# Patient Record
Sex: Female | Born: 1955
Health system: Southern US, Community
[De-identification: ages and names within clinical notes are randomized; demographics above are authoritative.]

## PROBLEM LIST (undated history)

## (undated) DIAGNOSIS — Z9989 Dependence on other enabling machines and devices: Secondary | ICD-10-CM

## (undated) DIAGNOSIS — G4733 Obstructive sleep apnea (adult) (pediatric): Secondary | ICD-10-CM

## (undated) DIAGNOSIS — I4892 Unspecified atrial flutter: Secondary | ICD-10-CM

## (undated) DIAGNOSIS — Z79899 Other long term (current) drug therapy: Secondary | ICD-10-CM

## (undated) DIAGNOSIS — I10 Essential (primary) hypertension: Secondary | ICD-10-CM

## (undated) DIAGNOSIS — M1990 Unspecified osteoarthritis, unspecified site: Secondary | ICD-10-CM

## (undated) DIAGNOSIS — K5190 Ulcerative colitis, unspecified, without complications: Secondary | ICD-10-CM

## (undated) DIAGNOSIS — I480 Paroxysmal atrial fibrillation: Secondary | ICD-10-CM

## (undated) DIAGNOSIS — E663 Overweight: Secondary | ICD-10-CM

## (undated) HISTORY — DX: Overweight: E66.3

## (undated) HISTORY — DX: Paroxysmal atrial fibrillation: I48.0

## (undated) HISTORY — DX: Dependence on other enabling machines and devices: Z99.89

## (undated) HISTORY — DX: Obstructive sleep apnea (adult) (pediatric): G47.33

## (undated) HISTORY — DX: Ulcerative colitis, unspecified, without complications: K51.90

## (undated) HISTORY — DX: Unspecified atrial flutter: I48.92

## (undated) HISTORY — DX: Other long term (current) drug therapy: Z79.899

---

## 1998-04-25 ENCOUNTER — Encounter: Payer: Self-pay | Admitting: Internal Medicine

## 1998-04-25 ENCOUNTER — Ambulatory Visit (HOSPITAL_COMMUNITY): Admission: RE | Admit: 1998-04-25 | Discharge: 1998-04-25 | Payer: Self-pay

## 1999-05-12 ENCOUNTER — Ambulatory Visit (HOSPITAL_COMMUNITY): Admission: RE | Admit: 1999-05-12 | Discharge: 1999-05-12 | Payer: Self-pay | Admitting: Surgery

## 1999-05-12 ENCOUNTER — Encounter: Payer: Self-pay | Admitting: Internal Medicine

## 2000-07-27 ENCOUNTER — Encounter: Payer: Self-pay | Admitting: Emergency Medicine

## 2000-07-27 ENCOUNTER — Emergency Department (HOSPITAL_COMMUNITY): Admission: AC | Admit: 2000-07-27 | Discharge: 2000-07-27 | Payer: Self-pay

## 2000-11-12 ENCOUNTER — Encounter: Payer: Self-pay | Admitting: Internal Medicine

## 2000-11-12 ENCOUNTER — Ambulatory Visit (HOSPITAL_COMMUNITY): Admission: RE | Admit: 2000-11-12 | Discharge: 2000-11-12 | Payer: Self-pay | Admitting: Internal Medicine

## 2001-12-25 ENCOUNTER — Encounter: Payer: Self-pay | Admitting: Internal Medicine

## 2001-12-25 ENCOUNTER — Ambulatory Visit (HOSPITAL_COMMUNITY): Admission: RE | Admit: 2001-12-25 | Discharge: 2001-12-25 | Payer: Self-pay | Admitting: Internal Medicine

## 2002-03-05 HISTORY — PX: COLON SURGERY: SHX602

## 2002-10-23 ENCOUNTER — Encounter: Payer: Self-pay | Admitting: Internal Medicine

## 2002-10-23 ENCOUNTER — Inpatient Hospital Stay (HOSPITAL_COMMUNITY): Admission: AD | Admit: 2002-10-23 | Discharge: 2002-10-31 | Payer: Self-pay | Admitting: Internal Medicine

## 2002-10-26 ENCOUNTER — Encounter: Payer: Self-pay | Admitting: Internal Medicine

## 2002-10-27 ENCOUNTER — Encounter: Payer: Self-pay | Admitting: Internal Medicine

## 2002-10-30 ENCOUNTER — Encounter: Payer: Self-pay | Admitting: Internal Medicine

## 2002-11-04 ENCOUNTER — Ambulatory Visit (HOSPITAL_COMMUNITY): Admission: RE | Admit: 2002-11-04 | Discharge: 2002-11-04 | Payer: Self-pay | Admitting: General Surgery

## 2002-11-04 ENCOUNTER — Encounter: Payer: Self-pay | Admitting: General Surgery

## 2002-11-08 ENCOUNTER — Encounter: Payer: Self-pay | Admitting: General Surgery

## 2002-11-08 ENCOUNTER — Inpatient Hospital Stay (HOSPITAL_COMMUNITY): Admission: EM | Admit: 2002-11-08 | Discharge: 2002-11-14 | Payer: Self-pay | Admitting: Emergency Medicine

## 2002-11-10 ENCOUNTER — Encounter: Payer: Self-pay | Admitting: Surgery

## 2002-11-14 ENCOUNTER — Inpatient Hospital Stay (HOSPITAL_COMMUNITY): Admission: RE | Admit: 2002-11-14 | Discharge: 2002-11-28 | Payer: Self-pay | Admitting: Surgery

## 2002-11-16 ENCOUNTER — Encounter (INDEPENDENT_AMBULATORY_CARE_PROVIDER_SITE_OTHER): Payer: Self-pay | Admitting: Specialist

## 2002-11-17 ENCOUNTER — Encounter: Payer: Self-pay | Admitting: Surgery

## 2002-11-23 ENCOUNTER — Encounter: Payer: Self-pay | Admitting: Surgery

## 2003-01-12 ENCOUNTER — Ambulatory Visit (HOSPITAL_COMMUNITY): Admission: RE | Admit: 2003-01-12 | Discharge: 2003-01-12 | Payer: Self-pay | Admitting: Internal Medicine

## 2003-01-26 ENCOUNTER — Encounter: Admission: RE | Admit: 2003-01-26 | Discharge: 2003-01-26 | Payer: Self-pay | Admitting: Internal Medicine

## 2003-06-17 ENCOUNTER — Encounter: Admission: RE | Admit: 2003-06-17 | Discharge: 2003-06-17 | Payer: Self-pay | Admitting: Internal Medicine

## 2004-05-22 ENCOUNTER — Encounter: Admission: RE | Admit: 2004-05-22 | Discharge: 2004-05-22 | Payer: Self-pay | Admitting: Internal Medicine

## 2004-06-05 ENCOUNTER — Ambulatory Visit (HOSPITAL_COMMUNITY): Admission: RE | Admit: 2004-06-05 | Discharge: 2004-06-05 | Payer: Self-pay | Admitting: Internal Medicine

## 2004-07-09 ENCOUNTER — Emergency Department (HOSPITAL_COMMUNITY): Admission: EM | Admit: 2004-07-09 | Discharge: 2004-07-09 | Payer: Self-pay | Admitting: Emergency Medicine

## 2005-08-22 ENCOUNTER — Ambulatory Visit (HOSPITAL_COMMUNITY): Admission: RE | Admit: 2005-08-22 | Discharge: 2005-08-22 | Payer: Self-pay | Admitting: Internal Medicine

## 2005-12-06 ENCOUNTER — Other Ambulatory Visit: Admission: RE | Admit: 2005-12-06 | Discharge: 2005-12-06 | Payer: Self-pay | Admitting: Internal Medicine

## 2007-01-27 ENCOUNTER — Ambulatory Visit (HOSPITAL_COMMUNITY): Admission: RE | Admit: 2007-01-27 | Discharge: 2007-01-27 | Payer: Self-pay | Admitting: Family Medicine

## 2008-02-12 ENCOUNTER — Ambulatory Visit (HOSPITAL_COMMUNITY): Admission: RE | Admit: 2008-02-12 | Discharge: 2008-02-12 | Payer: Self-pay | Admitting: Family Medicine

## 2009-02-15 ENCOUNTER — Ambulatory Visit (HOSPITAL_COMMUNITY): Admission: RE | Admit: 2009-02-15 | Discharge: 2009-02-15 | Payer: Self-pay | Admitting: Family Medicine

## 2010-03-02 ENCOUNTER — Ambulatory Visit (HOSPITAL_COMMUNITY)
Admission: RE | Admit: 2010-03-02 | Discharge: 2010-03-02 | Payer: Self-pay | Source: Home / Self Care | Attending: Family Medicine | Admitting: Family Medicine

## 2010-03-26 ENCOUNTER — Encounter: Payer: Self-pay | Admitting: Internal Medicine

## 2010-04-10 ENCOUNTER — Observation Stay (HOSPITAL_COMMUNITY)
Admission: EM | Admit: 2010-04-10 | Discharge: 2010-04-11 | DRG: 139 | Disposition: A | Discharge status: Home / Self Care | Payer: BC Managed Care – PPO | Attending: Interventional Cardiology | Admitting: Interventional Cardiology

## 2010-04-10 DIAGNOSIS — R0989 Other specified symptoms and signs involving the circulatory and respiratory systems: Secondary | ICD-10-CM | POA: Insufficient documentation

## 2010-04-10 DIAGNOSIS — I4892 Unspecified atrial flutter: Principal | ICD-10-CM | POA: Insufficient documentation

## 2010-04-10 DIAGNOSIS — R0609 Other forms of dyspnea: Secondary | ICD-10-CM | POA: Insufficient documentation

## 2010-04-10 DIAGNOSIS — F17200 Nicotine dependence, unspecified, uncomplicated: Secondary | ICD-10-CM | POA: Insufficient documentation

## 2010-04-10 DIAGNOSIS — E785 Hyperlipidemia, unspecified: Secondary | ICD-10-CM | POA: Insufficient documentation

## 2010-04-10 DIAGNOSIS — K5190 Ulcerative colitis, unspecified, without complications: Secondary | ICD-10-CM | POA: Insufficient documentation

## 2010-04-10 LAB — DIFFERENTIAL
Basophils Absolute: 0.1 10*3/uL (ref 0.0–0.1)
Basophils Relative: 1 % (ref 0–1)
Eosinophils Absolute: 0.2 10*3/uL (ref 0.0–0.7)
Eosinophils Relative: 3 % (ref 0–5)
Lymphocytes Relative: 42 % (ref 12–46)
Lymphs Abs: 3.3 10*3/uL (ref 0.7–4.0)
Monocytes Absolute: 0.7 10*3/uL (ref 0.1–1.0)
Monocytes Relative: 8 % (ref 3–12)
Neutro Abs: 3.7 10*3/uL (ref 1.7–7.7)
Neutrophils Relative %: 47 % (ref 43–77)

## 2010-04-10 LAB — BASIC METABOLIC PANEL
BUN: 11 mg/dL (ref 6–23)
CO2: 20 mEq/L (ref 19–32)
Calcium: 8.9 mg/dL (ref 8.4–10.5)
Chloride: 108 mEq/L (ref 96–112)
Creatinine, Ser: 0.73 mg/dL (ref 0.4–1.2)
GFR calc Af Amer: >60 mL/min (ref >60)
GFR calc non Af Amer: >60 mL/min (ref >60)
Glucose, Bld: 118 mg/dL — ABNORMAL HIGH (ref 70–99)
Potassium: 4 mEq/L (ref 3.5–5.1)
Sodium: 138 mEq/L (ref 135–145)

## 2010-04-10 LAB — LIPID PANEL
Cholesterol: 204 mg/dL — ABNORMAL HIGH (ref 0–200)
HDL: 62 mg/dL (ref >39)
LDL Cholesterol: 65 mg/dL (ref 0–99)
Total CHOL/HDL Ratio: 3.3 RATIO
Triglycerides: 385 mg/dL — ABNORMAL HIGH (ref <150)
VLDL: 77 mg/dL — ABNORMAL HIGH (ref 0–40)

## 2010-04-10 LAB — CARDIAC PANEL(CRET KIN+CKTOT+MB+TROPI)
CK, MB: 1.5 ng/mL (ref 0.3–4.0)
CK, MB: 1.2 ng/mL (ref 0.3–4.0)
Relative Index: INVALID (ref 0.0–2.5)
Relative Index: INVALID (ref 0.0–2.5)
Total CK: 67 U/L (ref 7–177)
Total CK: 59 U/L (ref 7–177)
Troponin I: 0.01 ng/mL (ref 0.00–0.06)
Troponin I: <0.01 ng/mL (ref 0.00–0.06)

## 2010-04-10 LAB — CK TOTAL AND CKMB (NOT AT ARMC)
CK, MB: 1.5 ng/mL (ref 0.3–4.0)
Relative Index: INVALID (ref 0.0–2.5)
Total CK: 77 U/L (ref 7–177)

## 2010-04-10 LAB — TSH: TSH: 1.06 u[IU]/mL (ref 0.350–4.500)

## 2010-04-10 LAB — PROTIME-INR
INR: 0.8 (ref 0.00–1.49)
Prothrombin Time: 11.3 seconds — ABNORMAL LOW (ref 11.6–15.2)

## 2010-04-10 LAB — CBC
HCT: 48.4 % — ABNORMAL HIGH (ref 36.0–46.0)
Hemoglobin: 17.1 g/dL — ABNORMAL HIGH (ref 12.0–15.0)
MCH: 34.7 pg — ABNORMAL HIGH (ref 26.0–34.0)
MCHC: 35.3 g/dL (ref 30.0–36.0)
MCV: 98.2 fL (ref 78.0–100.0)
Platelets: 250 10*3/uL (ref 150–400)
RBC: 4.93 MIL/uL (ref 3.87–5.11)
RDW: 12.1 % (ref 11.5–15.5)
WBC: 7.8 10*3/uL (ref 4.0–10.5)

## 2010-04-10 LAB — BRAIN NATRIURETIC PEPTIDE: Pro B Natriuretic peptide (BNP): 126 pg/mL — ABNORMAL HIGH (ref 0.0–100.0)

## 2010-04-10 LAB — TROPONIN I: Troponin I: 0.01 ng/mL (ref 0.00–0.06)

## 2010-04-10 LAB — MRSA PCR SCREENING: MRSA by PCR: NEGATIVE

## 2010-04-10 LAB — APTT: aPTT: 33 seconds (ref 24–37)

## 2010-04-11 ENCOUNTER — Inpatient Hospital Stay (HOSPITAL_COMMUNITY): Payer: BC Managed Care – PPO

## 2010-04-11 LAB — BASIC METABOLIC PANEL
BUN: 9 mg/dL (ref 6–23)
CO2: 27 mEq/L (ref 19–32)
Calcium: 9 mg/dL (ref 8.4–10.5)
Chloride: 105 mEq/L (ref 96–112)
Creatinine, Ser: 0.81 mg/dL (ref 0.4–1.2)
GFR calc Af Amer: >60 mL/min (ref >60)
GFR calc non Af Amer: >60 mL/min (ref >60)
Glucose, Bld: 98 mg/dL (ref 70–99)
Potassium: 3.6 mEq/L (ref 3.5–5.1)
Sodium: 139 mEq/L (ref 135–145)

## 2010-04-11 LAB — CBC
HCT: 42.2 % (ref 36.0–46.0)
Hemoglobin: 14.4 g/dL (ref 12.0–15.0)
MCH: 34.1 pg — ABNORMAL HIGH (ref 26.0–34.0)
MCHC: 34.1 g/dL (ref 30.0–36.0)
MCV: 100 fL (ref 78.0–100.0)
Platelets: 204 10*3/uL (ref 150–400)
RBC: 4.22 MIL/uL (ref 3.87–5.11)
RDW: 12.3 % (ref 11.5–15.5)
WBC: 6.3 10*3/uL (ref 4.0–10.5)

## 2010-04-11 LAB — BRAIN NATRIURETIC PEPTIDE: Pro B Natriuretic peptide (BNP): 53 pg/mL (ref 0.0–100.0)

## 2010-04-16 NOTE — Consult Note (Signed)
NAMECAMRIN, LAPRE            ACCOUNT NO.:  [id number]  MEDICAL RECORD NO.:  [id number]           PATIENT TYPE:  I  LOCATION:  2021                         FACILITY:  [facility]  PHYSICIAN:  Lyn Records, M.D.   DATE OF BIRTH:  15-Oct-1955  DATE OF CONSULTATION:  04/10/2010 DATE OF DISCHARGE:                                CONSULTATION   CONCLUSIONS: 1. Atrial flutter reverted to normal sinus rhythm spontaneously after     starting intravenous Cardizem.     a.     Congestive heart failure, hypertension, age, diabetes, prior      stroke is zero. 2. History of ulcerative colitis. 3. Tobacco use. 4. Alcohol use.  PLAN: 1. Metoprolol succinate 25 mg p.o. b.i.d. 2. Check TSH. 3. Check echo. 4. Discontinue diltiazem. 5. Lovenox until echo results show no.  COMMENTS:  [name] is 19, a registered nurse and now massage therapist.  She had intermittent episodes of palpitations on April 09, 2010.  She went to bed early because she was in a rapid heart rate. Valsalva and other maneuvers did not influence the arrhythmia.  During the day, the initial episode resolved spontaneously.  There was recurrence around 3 p.m.  It continued until she fell asleep at 7 o'clock.  After awakening at 3:30 a.m. and noting that it was still present, she came to the emergency room.  She could fell palpitations and had some sense of dyspnea.  There was also lightheadedness.  She denies syncope.  There is no typical orthopnea, PND, lower extremity swelling, history of heart disease, or prior history of arrhythmia.  HABITS:  Drinks alcohol.  Smokes 3 cigarettes per day.  SIGNIFICANT MEDICAL HISTORY: 1. Ulcerative colitis. 2. Hyperlipidemia. 3. Diverticulosis.  PAST SURGICAL HISTORY: 1. Diverticular abscess. 2. Hammertoe surgery.  MEDICATIONS:  Avacor and Crestor.  ALLERGIES:  PENICILLIN and LATEX.  FAMILY HISTORY:  CAD and atrial fibrillation, hypertension.  Father died of a  CVA.  REVIEW OF SYSTEMS:  Unremarkable.  PHYSICAL EXAMINATION:  GENERAL:  Dannia is in no acute distress. VITAL SIGNS:  Her blood pressure is 115/70, heart rate is 70.  Monitor reveals normal sinus rhythm. NECK:  Neck veins reveal no JVD.  No carotid bruits are heard. CARDIAC:  Normal. LUNGS:  Clear. ABDOMEN:  Soft.  Bowel sounds are normal. EXTREMITIES:  No edema.  Pulses are 2+ and symmetric in the upper and lower extremities. NEUROLOGIC:  Unremarkable.  Baseline EKG and normal sinus rhythm is normal.  Laboratory data reveals potassium of 4.0, creatinine of 0.73, total cholesterol of 2004, LDL cholesterol of 65, triglyceride of 385. Hemoglobin is 17.1.  DISCUSSION:  Paroxysmal atrial flutter.  We will get baseline data set. The patient's CHADS score is zero and therefore Coumadin will not be needed.  If we have frequent recurrences of atrial flutter, we may need to consider ablation or antiarrhythmic therapy.  We will go with beta- blocker therapy for the time being and aspirin 325 mg per day, coated.     Lyn Records, M.D.HWS/MEDQ  D:  04/10/2010  T:  04/10/2010  Job:  [id number]  cc:   Sonny Masters  Carmon Ginsberg, M.D.  Electronically Signed by Verdis Prime M.D. on 04/13/2010 05:01:35 PM

## 2010-04-16 NOTE — Discharge Summary (Signed)
  Catherine Callahan, Catherine Callahan            ACCOUNT NO.:  [id number]  MEDICAL RECORD NO.:  [id number]           PATIENT TYPE:  I  LOCATION:  2021                         FACILITY:  [facility]  PHYSICIAN:  Belva Crome, M.D.   DATE OF BIRTH:  04-23-1955  DATE OF ADMISSION:  04/10/2010 DATE OF DISCHARGE:  04/11/2010                              DISCHARGE SUMMARY   REASON FOR ADMISSION:  Atrial flutter.  DISCHARGE DIAGNOSES: 1. Paroxysmal atrial flutter with spontaneous reversion after starting     IV Cardizem. 2. Hyperlipidemia. 3. Ulcerative colitis.  PROCEDURES PERFORMED:  None.  DISCHARGE INSTRUCTIONS:  The patient's medication regimen will be the same as on admission with the following additions: 1. Aspirin, coated 325 mg per day. 2. Metoprolol succinate 25 mg b.i.d.  The other medications will include Asacol, Crestor, glucosamine, and multiple vitamins.  ACTIVITY:  As tolerated.  RESTRICTIONS:  No alcohol.  FOLLOWUP:  Dr. Daneen Schick on April 20, 2010, at 10 a.m.  HISTORY AND PHYSICAL AND HOSPITAL COURSE:  The patient was admitted to the hospital after developing tachy palpitations that went on for 6-12 hours.  In the emergency room, she was found to be in atrial fib/flutter with rapid rates.  IV Cardizem was started and subsequently there was spontaneous conversion.  There was no recurrence while in the hospital. Cardiac markers were negative.  Chest x-ray did not reveal any significant abnormality.  Other laboratory data were unremarkable. These laboratory data included a hemoglobin of 14.4 on the day of discharge, normal white count, potassium of 3.6, BUN and creatinine of 9 and 0.81, the total cholesterol was 204, LDL cholesterol 65.  TSH 1.06.  Chest x-ray is also pending at the time of discharge as well as echocardiogram, both of which were omitted on the admission orders.  These will be performed before discharge today.  The patient will follow up with me in 10  days.     Belva Crome, M.D.     HWS/MEDQ  D:  04/11/2010  T:  04/11/2010  Job:  [id number]  Electronically Signed by Daneen Schick M.D. on 04/13/2010 05:01:39 PM

## 2010-05-08 NOTE — H&P (Signed)
Catherine Callahan, Catherine Callahan            ACCOUNT NO.:  [id number]  MEDICAL RECORD NO.:  [id number]           PATIENT TYPE:  E  LOCATION:  [facility]                         FACILITY:  [facility]  PHYSICIAN:  Jana Hakim, M.D. DATE OF BIRTH:  [birth year]  DATE OF ADMISSION:  04/10/2010 DATE OF DISCHARGE:                             HISTORY & PHYSICAL   PRIMARY CARE PHYSICIAN:  Judithann Sauger, MD, the [facility].  CHIEF COMPLAINT:  Heart racing, palpitations.  HISTORY OF PRESENT ILLNESS:  This is a 55 year old female who presents to the emergency department secondary to complaints of palpitations and heart racing off and on since yesterday afternoon.  The patient reports checking her heart rate, finding yet to be in 160s.  She said that the initial episodes would resolve.  She had recurrence of the racing heart, which would not resolve this evening and presented to the emergency department at about 3:20 a.m.  She reports having lightheadedness and shortness of breath.  She denies having any chest pain.  When she arrived in the emergency department, her heart rate was found to be 166. An EKG was performed, results of which revealed atrial flutter at a rate of 163.  The patient was evaluated in the emergency department.  By the EDP, she was administered adenosine x1 dose without resolve and then was loaded with a diltiazem dose and then placed on diltiazem drip.  She was referred for medical admission afterward.  PAST MEDICAL HISTORY:  Significant for ulcerative colitis, diverticulosis, hyperlipidemia.  PAST SURGICAL HISTORY:  Surgery for diverticular rupture and also right hammer toe and bunion surgery.  MEDICATIONS:  Include multivitamin, Crestor and Advicor.  ALLERGIES:  PENICILLIN and LATEX.  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  She is a smoker.  She previously smoked one  half-a-pack of cigarettes daily and has smoked for 20 years.  She is down to 3-4 cigarettes daily.  She reports occasional alcohol use.  Denies any illicit drug usage.  FAMILY HISTORY:  Positive for coronary artery disease in both of her parents.  Positive hypertension in her father.  Her father died of a cerebrovascular accident.  No diabetic disease in her family and cancer in her remote family.  REVIEW OF SYSTEMS:  Pertinent as mentioned above.  PHYSICAL EXAMINATION:  GENERAL:  This is a 55 year old well-nourished, well-developed Caucasian female in no visible discomfort or acute distress currently. VITAL SIGNS:  Her heart rate is now 97.  Her initial intake vitals were temperature 98.2, blood pressure 129/105, heart rate 166, respirations 24, O2 sats 98-99%. HEENT:  Normocephalic, atraumatic.  Pupils equally round and reactive to light.  Extraocular movements are intact.  Funduscopic benign.  There is no scleral icterus.  Nares are patent bilaterally.  Oropharynx is clear. NECK:  Supple.  Full range of motion.  No thyromegaly, adenopathy or jugular venous distention. CARDIOVASCULAR:  Tachycardic, irregular rhythm.  No murmurs, gallops or rubs appreciated. LUNGS:  Clear to auscultation bilaterally.  No rales, rhonchi or wheezes. ABDOMEN:  Positive bowel sounds.  Soft,  nontender, nondistended.  No hepatosplenomegaly. Extremities:  Without cyanosis, clubbing or edema. NEUROLOGIC:  Nonfocal.  LABORATORY STUDIES:  White blood cell count 7.8, hemoglobin 70.1, hematocrit 48.4, MCV 98.2, platelets 250, neutrophils 47%, lymphocytes 42%.  Sodium 138, potassium 4.0, chloride 108, carbon dioxide 20, BUN 11, creatinine 0.73, glucose 118.  Cardiac enzymes with a total CK of 77, CK-MB 1.5, troponin 0.01.  Protime 11.3, INR 0.80 and PTT 33.  EKG as mentioned above in the HPI.  ASSESSMENT:  A 55 year old female being admitted with: 1. Atrial fibrillation with rapid ventricular rate. 2.  Lightheadedness and dizziness secondary atrial fibrillation with     rapid ventricular rate. 3. Dyslipidemia. 4. History of ulcerative colitis.  PLAN:  The patient will be admitted to the ICU secondary to the diltiazem drip.  She is responding well to the diltiazem drip with a decrease in her heart rate and the patient appears to have converted. She will be transitioned from the trip to oral diltiazem therapy.  Also, the patient will be placed on the Coumadin protocol.  Cardiac enzymes will be performed along with fasting lipids and a TSH will be ordered. The patient's electrolytes will be monitored and corrected as needed and per the patient's request a Cardiology consultation will be requested. Dr. Daneen Schick of [facility] cardiology will be consulted.  The patient's regular medications will be reconciled and the patient is a full code at this time.  Further workup will ensue pending results of the patient's clinical course and her studies.     Jana Hakim, M.D.     HJ/MEDQ  D:  04/10/2010  T:  04/10/2010  Job:  [id number]  cc:   Judithann Sauger, M.D.  Electronically Signed by Jana Hakim M.D. on 05/08/2010 08:10:32 PM

## 2010-07-21 NOTE — Op Note (Signed)
NAMEANNESA, OBERHOLTZER                        ACCOUNT NO.:  [id number]   MEDICAL RECORD NO.:  [id number]                   PATIENT TYPE:  INP   LOCATION:  [place]                                 FACILITY:  [facility]   PHYSICIAN:  Isabel Caprice. Hassell Done, M.D.             DATE OF BIRTH:  08-Mar-1955   DATE OF PROCEDURE:  11/16/2002  DATE OF DISCHARGE:                                 OPERATIVE REPORT   PREOPERATIVE DIAGNOSIS:  Diverticulitis, status post drainage of abscess.   POSTOPERATIVE DIAGNOSIS:  Diverticulitis, status post drainage of abscess.   OPERATION/PROCEDURE:  1. Laparotomy.  2. Take-down of abscess.  3. Resection of perforated diverticulitis.  4. Primary hand sewn anastomosis.  5. Excision of Meckel's diverticulum.   SURGEON:  Isabel Caprice. Hassell Done, M.D.   ASSISTANT:  Darrelyn Hillock, M.D.   ANESTHESIA:  General endotracheal anesthesia.   DRAINS:  One 38 Blake drain in the pelvis.   INDICATIONS:  Aleighya Fenerty is a 55 year old lady who has been having  intermittent bouts of diverticulitis off and on for the last several months,  recently culminating in formation of an abscess which was drained  percutaneously.  Attempts were made to manage this and delay intervention  but this failed with recurrence of pain and white count elevation.  She was  transferred to [facility] for more urgent resection of this process.  Informed  consent had been obtained preoperatively with the goal that we are going to  try to avoid a colostomy if possible and for primary anastomosis if  possible.  The patient, however, realizes  the possibility of an ostomy  either primarily or secondarily.   DESCRIPTION OF PROCEDURE:  Mrs. Sarratt was taken to Room 12 on November 16, 2002 and given general anesthesia.  Preoperatively she had been  maintained on Azactam and Flagyl.  The abdomen was prepped with Betadine and  draped sterilely.  The equipment used was Latex free.  Foley catheter was  inserted.  Lower midline incision was made and the abdomen was entered  without difficulty.  A wound protector was inserted.  I palpated the liver.  It felt normal.  An NG tube was subsequently found to be in good position in  the stomach.  Small intestine was densely adherent to the colon which was  stuck on itself and  this in turn was stuck in the pelvis.  I initially  began sharply taking down the small intestine from where it was stuck to the  colon.  This would require me to extend to incision above the umbilicus.  I  eventually was able to break up this loop of ileum approximately 12 inches  back from the ileocecal valve where an sigmoid-shaped area of small bowel  had formed part of the wall of the abscess.  This was taken down and no  enterotomy appeared to be present.  This was tucked back up inside the  abdomen  and subsequently reexamined at the end of the case.   In the meantime I mobilized the sigmoid from the pelvic sidewalls and found  a soft area, proximally divided the bowel with the GIA.  I used a Harmonic  scalpel to go to the mesentery and stayed near the bowel and did not get  into the retroperitoneum or the area where I felt like the ureters could be  at risk.  Obvious vessels to this area of the sigmoid were oversewn with 2-0  silk suture ligature with figure-of-eight.  I went down below and the bowel  just seemed to be pretty well attenuated and dissolved.  I resected this and  ended up leaving an open area distally.  I inserted a Foley catheter and  this helped me identify a nice smooth cut of the mucosa and then I trimmed  all the indurated area of the serosa off this area of bowel.   The bowel was free proximally to the point where I felt I do a hand sewn  anastomosis tension free.  I cleaned off the ends of the proximal bowel and  then placed posterior wall of 3-0 silks and tied this down.  This  approximated the proximal bowel to the distal bowel.  I then, with  the Foley  catheter in place, ran several segments of running locking sutures  posteriorly to approximate the inner layer, getting pretty much full  thickness in a running locking fashion.  Anterior I came out and did a  Connell suture to complete the anterior portion of the anastomosis.  A  second seromuscular layer was performed using horizontal mattress sutures of  3-0 silk.  This completed an anastomosis which, when I removed the bowel  clamp, appeared to be intact.  The Foley catheter was removed at the very  end and good intact anastomosis was felt to be present.  I placed a 19 Blake  drain down into the pelvis.  We irrigated with copious amounts of saline.  We changed gloves.  Strategy-wise I felt that the two-layer anastomosis was  intact and then I elected not to perform a diverting colostomy.  I felt that  we would watch this closely and with the drain in place, possibly do a study  before feeding her.   Sponge and needle counts were reported as correct.  The omentum was then  brought down over the midline.  Prior to closure, the Meckel's diverticulum  was transected using the GIA.  The small bowel was reinspected and it  appeared that things should be able to heal primarily.   The lower portion of the peritoneum was closed with 2-0 Vicryl.  The wound  was closed from above and below with #1 PDS, tied in the middle. The wound  was irrigated with saline and closed with a stapler.  The patient seemed to  tolerate the procedure well and was taken to the recovery room in  satisfactory condition.                                                 Isabel Caprice Hassell Done, M.D.    MBM/MEDQ  D:  11/16/2002  T:  11/16/2002  Job:  [id number]   cc:   Leilani Merl, M.D.  [address] [place]  [place] [address]  Fax: [phone]

## 2010-07-21 NOTE — H&P (Signed)
NAMEDICKSIE, LALUZERNE                        ACCOUNT NO.:  [id number]   MEDICAL RECORD NO.:  [id number]                   PATIENT TYPE:  INP   LOCATION:  [id number]                                 FACILITY:  [facility]   PHYSICIAN:  Leilani Merl, M.D.             DATE OF BIRTH:  08-09-1955   DATE OF ADMISSION:  10/23/2002  DATE OF DISCHARGE:                                HISTORY & PHYSICAL   Catherine Callahan is a 55 year old woman who is being admitted for failed  outpatient treatment of diverticulitis.   HISTORY OF PRESENT ILLNESS:  Catherine Callahan is a very pleasant 55 year old  woman who has a history of diverticulitis (CT diagnosed) in February, 2004.  She responded to p.o. antibiotics at that time with improvement in her  symptoms.  She was to go for a follow up CT to document improvement but did  not come in for follow up care.  She had a recurrence of symptoms last  weekend after eating popcorn.  She had intense bilateral lower quadrant pain  with low-grade fever.  She was seen in the office on October 19, 2002 and  treated with Cipro and Flagyl p.o.  We did do a CT scan at that point that  showed acute sigmoid diverticulitis without evidence of perforation.  When  she was seen in follow up on October 22, 2002 she had increasing pain and  continued fever, she was given Demerol and Phenergan and told to come back  in 24 hours.  This morning she is continuing to have pain.  She has been  requiring up to two Vicodin q.6h to take care of the pain.  Still had a  fever of 101 this morning.  Had slight nausea last night without emesis.  She does not feel considerably improved compared to two days ago.   PAST MEDICAL HISTORY:  1. Diverticulitis in 2004.  2. Oral HSC in the past.  3. Hyperlipidemia.   PAST SURGICAL HISTORY:  None.   FAMILY HISTORY:  No colon cancer.  Father had coronary artery disease in his  71's, mother with hypertension.   SOCIAL HISTORY:  She is a widow, lost  her husband to glioblastoma a couple  of years ago.  She works as a Geophysicist/field seismologist at [employer], is an  [employer] by training.  Tobacco is about two cigarettes a day.  Alcohol two  drinks a day, none for over a week.   MEDICATIONS:  No medications prior to acute illness.  Currently on Cipro 500  mg b.i.d. and Flagyl 500 q.i.d.   ALLERGIES:  PENICILLIN, ANAPHYLACTIC REACTION IN CHILDHOOD.  Keflex has been  okay recently.   PHYSICAL EXAMINATION:  GENERAL: Pleasant woman, appears uncomfortable but in  no acute distress.  Weight 176. Blood pressure 110/70, heart rate 72,  temperature 99.  Had Vicodin this morning.  HEENT: Pupils are equal, round and reactive to light.  Extraocular muscles  intact. Oropharynx clear although appears a little dry.  NECK: Supple without jugular venous distension or bruits.  LUNGS: Clear.  HEART: Regular rhythm and rate.  ABDOMEN: Soft, she has bilateral lower quadrant tenderness, left greater  than right, no rebound, no guarding.  PELVIC: Exam does not reveal adnexal tenderness.  She does have slight  cervical motion tenderness.  RECTAL: Exam was unremarkable.  Stool is heme negative.  EXTREMITIES: Unremarkable.  NEURO: Exam is nonfocal.   LABORATORY DATA:  White count is 13.4 thousand which is up from 10  yesterday, there are 85% polys, hemoglobin 14.7, platelet count 384,000.   ASSESSMENT:  55 year old woman with history of diverticulitis, current  diverticulitis shown by CT, now with increasing pain, increasing white count  despite antibiotics and continued fever despite four days of antibiotic  treatment.  She also has some cervical motion tenderness on exam, question  of ongoing pelvic infection as well.  GC and chlamydia were done in the  office and pending.   PLAN:  Admit and place her on IV fluid, I am going to obtain a CT scan of  the abdomen and make sure there is no interval change.  We will place her on  IV antibiotics.  Due to her  anaphylactic reaction to penicillin in  childhood, I am avoiding imipenem.  We will put her on Cefepime, she has  tolerated Keflex as recently as a week ago.  In addition will put her on  Flagyl IV.  May need surgical consult, depending on CT results.                                                 Leilani Merl, M.D.    MJM/MEDQ  D:  10/23/2002  T:  10/24/2002  Job:  [id number]

## 2010-07-21 NOTE — Discharge Summary (Signed)
   NAMEANDRAYA, [name]                        ACCOUNT NO.:  [id number]   MEDICAL RECORD NO.:  [id number]                   PATIENT TYPE:  INP   LOCATION:  0472                                 FACILITY:  [facility]   PHYSICIAN:  Thornton Park. Daphine Deutscher, M.D.             DATE OF BIRTH:  1955-03-22   DATE OF ADMISSION:  11/14/2002  DATE OF DISCHARGE:  11/28/2002                                 DISCHARGE SUMMARY   DIAGNOSIS:  Perforated diverticulitis.   PROCEDURE:  Resection of perforated diverticulitis and resection of Meckel's  diverticulum with low anterior handsewn anastomosis.   COURSE IN THE HOSPITAL:  Catherine Callahan has had recurrent bouts of  diverticulitis prompting her re-admission to [facility].  She was  transferred to [facility] after discharge out of [facility] and she drove herself over  to [place], and was re-admitted for her pending surgery on November 16, 2002.  This was done and a PICC line was placed and she was put on TNA to  give this anastomosis time to heal.  She began passing gas, was started on a  diet, her Jackson-Pratt drain was pulled, and she continued to do well and  was ready for discharge on November 28, 2002, feeling good.   Her path report showed diverticulitis with pericolonic abscess formation, no  evidence of malignancy, and she had a small benign bowel Meckel's  diverticulum.   CONDITION:  Improved.                                               Thornton Park Daphine Deutscher, M.D.    MBM/MEDQ  D:  12/23/2002  T:  12/23/2002  Job:  [id number]

## 2010-07-21 NOTE — Discharge Summary (Signed)
[name], [name]                        ACCOUNT NO.:  [id number]   MEDICAL RECORD NO.:  [id number]                   PATIENT TYPE:  INP   LOCATION:  [id number]                                 FACILITY:  [facility]   PHYSICIAN:  Leilani Merl, M.D.             DATE OF BIRTH:  Aug 29, 1955   DATE OF ADMISSION:  10/23/2002  DATE OF DISCHARGE:  11/01/2002                                 DISCHARGE SUMMARY   ADMISSION DIAGNOSIS:  Diverticulitis with failed outpatient treatment.   DISCHARGE DIAGNOSIS:  Diverticulitis with abscess, status post percutaneous  drainage.   CONSULTATIONS:  1. Surgery via Dr. Johnathan Hausen.  2. Dr. Kathlene Cote, Interventional Radiology, per CT-guided percutaneous     drainage of abscess, October 27, 2002.   PROCEDURE:  Percutaneous drainage of abscess, October 27, 2002.   MEDICATIONS ON ADMISSION:  1. Flagyl 250 mg p.o. q.8h.  2. Cipro 500 mg p.o. b.i.d.   CONDITION ON DISCHARGE:  Improved.   FOLLOW UP:  1. To follow up with Dr. Verner Chol in 1-2 weeks following discharge.  2. Follow up with Dr. Johnathan Hausen 4-6 weeks following discharge.   BRIEF HISTORY:  Mrs. Catherine Callahan is a delightful 55 year old woman who  was admitted with continued pain and fever, and elevated white count despite  outpatient treatment of diverticulitis.   On admission, her temperature was 99, blood pressure 110/70, heart rate 72.  Exam was remarkable for bilateral lower quadrant tenderness without rebound.   White count on admission was 13.4 with 85% polys.  She was admitted and  placed on cefepime and Flagyl IV.  A CT showed diverticulitis with several  small pelvic abscesses in a small contained area of extra abdominal gas  consistent with a small amount of contained perforation.  There was no free  air seen.   HOSPITAL COURSE:  Dr. Johnathan Hausen was consulted on October 26, 2002 and  recommended percutaneous drainage and two weeks of antibiotic therapy.  May  need  elective colectomy, post resolution.   On October 27, 2002, she underwent interventional radiology procedure with CT-  guided abscess drainage.  Subsequent to procedure, she improved with  decreased ability to tolerate p.o. liquids.   Recheck CT, on October 30, 2002, showed essentially resolved drained abscess  with some thickening.  The decision was made to remove the drainage, and she  was changed to p.o. antibiotics, and she was discharged on November 01, 2002.   RADIOLOGY:  Abdominal CT, on October 23, 2002, at the time of discharge,  showing intraluminal gas and pelvic abscesses likely related to perforation  of sigmoid diverticulitis without free intraperitoneal air.  Followup CT on  October 27, 2002 was done, and interventional radiology was consulted with  percutaneous drainage of the abscess.  Followup CT on October 30, 2002  revealed improvement.  There remained a small collection of fluid in the  right paracentral pelvis plus a small collection  with air/fluid levels more  superiorly.   LABORATORY DATA:  White count on admission was 13.8; it was down to 7.9 at  the time of discharge; hemoglobin remained stable in the 14-12 range.  LFTs  were normal on admission.  Urine was unremarkable.  Abscess culture grew no  organism.                                                 Leilani Merl, M.D.    MJM/MEDQ  D:  11/20/2002  T:  11/22/2002  Job:  [id number]   cc:   Isabel Caprice Hassell Done, M.D.  [id number] [place]  [place] [address]  Fax: [phone]

## 2010-07-21 NOTE — H&P (Signed)
   NAMEBRAELIN, BROSCH                        ACCOUNT NO.:  [id number]   MEDICAL RECORD NO.:  [id number]                   PATIENT TYPE:   LOCATION:                                       FACILITY:   PHYSICIAN:  Thornton Park. Daphine Deutscher, M.D.             DATE OF BIRTH:   DATE OF ADMISSION:  DATE OF DISCHARGE:                                HISTORY & PHYSICAL   CHIEF COMPLAINT:  Refractory diverticulitis, pain.   HISTORY:  Catherine Callahan is a 55 year old lady who was transferred over  from [facility] and readmitted here for definitive resection of a perforated  diverticulitis.  She has been admitted a couple of occasions at [facility] with  pain and has been on TNA and antibiotics but despite that has had recurrence  of pain.  Arrangements were made for transfer to [facility] for definitive  resection.   PAST MEDICAL HISTORY:  The patient has been otherwise healthy.   ALLERGIES:  PENICILLIN, LATEX.   MEDICATIONS:  1. Flagyl.  2. Vicodin.  3. Morphine sulfate.   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   PAST SURGICAL HISTORY:  None.   PHYSICAL EXAMINATION:  VITAL SIGNS:  Height 5 feet 7 inches, weight 170.  HEENT:  Normocephalic.  Eyes:  Sclerae nonicteric.  Pupils are equal, round,  and reactive to light.  Nose and throat examination unremarkable.  CHEST:  Clear to auscultation.  HEART:  Sinus rhythm without murmurs or gallops.  ABDOMEN:  Deep pelvic pain, particularly on the left side.  EXTREMITIES:  Full range of motion.   IMPRESSION:  Recurrent diverticulitis.   PLAN:  Definitive resection.                                               Thornton Park Daphine Deutscher, M.D.    MBM/MEDQ  D:  01/11/2003  T:  01/11/2003  Job:  [id number]

## 2010-07-21 NOTE — Discharge Summary (Signed)
   NAMEANABETH, CHILCOTT                        ACCOUNT NO.:  [id number]   MEDICAL RECORD NO.:  [id number]                   PATIENT TYPE:  INP   LOCATION:  3005                                 FACILITY:  [facility]   PHYSICIAN:  Thornton Park. Daphine Deutscher, M.D.             DATE OF BIRTH:  1955-12-13   DATE OF ADMISSION:  11/08/2002  DATE OF DISCHARGE:  11/14/2002                                 DISCHARGE SUMMARY   ADMITTING DIAGNOSIS:  Recurrent diverticulitis.   TRANSFER DIAGNOSIS:  Recurrent diverticulitis for surgery the following  Monday at [facility].   COURSE IN THE HOSPITAL:  Nyree Applegate was admitted and placed on  antibiotic therapy and initially got better.  She still continued to have  fevers.  CT scan was done to look for something that could be drained and  nothing was seen that could be drained.  Plans were made for surgery and for  transfer prior to that time.   CONDITION:  Improved but surgery needed.                                                Thornton Park Daphine Deutscher, M.D.    MBM/MEDQ  D:  12/01/2002  T:  12/01/2002  Job:  [id number]

## 2010-12-05 ENCOUNTER — Inpatient Hospital Stay (HOSPITAL_COMMUNITY)
Admission: AD | Admit: 2010-12-05 | Discharge: 2010-12-06 | DRG: 139 | Disposition: A | Discharge status: Home / Self Care | Payer: BC Managed Care – PPO | Source: Ambulatory Visit | Attending: Interventional Cardiology | Admitting: Interventional Cardiology

## 2010-12-05 DIAGNOSIS — I4891 Unspecified atrial fibrillation: Secondary | ICD-10-CM | POA: Diagnosis present

## 2010-12-05 DIAGNOSIS — M19049 Primary osteoarthritis, unspecified hand: Secondary | ICD-10-CM | POA: Diagnosis present

## 2010-12-05 DIAGNOSIS — I4892 Unspecified atrial flutter: Principal | ICD-10-CM | POA: Diagnosis present

## 2010-12-05 DIAGNOSIS — E669 Obesity, unspecified: Secondary | ICD-10-CM | POA: Diagnosis present

## 2010-12-05 DIAGNOSIS — K219 Gastro-esophageal reflux disease without esophagitis: Secondary | ICD-10-CM | POA: Diagnosis present

## 2010-12-05 DIAGNOSIS — R030 Elevated blood-pressure reading, without diagnosis of hypertension: Secondary | ICD-10-CM | POA: Diagnosis present

## 2010-12-05 DIAGNOSIS — Z79899 Other long term (current) drug therapy: Secondary | ICD-10-CM

## 2010-12-05 DIAGNOSIS — E785 Hyperlipidemia, unspecified: Secondary | ICD-10-CM | POA: Diagnosis present

## 2010-12-05 DIAGNOSIS — I498 Other specified cardiac arrhythmias: Secondary | ICD-10-CM | POA: Diagnosis present

## 2010-12-05 DIAGNOSIS — Z7982 Long term (current) use of aspirin: Secondary | ICD-10-CM

## 2010-12-05 LAB — BASIC METABOLIC PANEL
BUN: 11 mg/dL (ref 6–23)
CO2: 28 mEq/L (ref 19–32)
Calcium: 9.7 mg/dL (ref 8.4–10.5)
Chloride: 101 mEq/L (ref 96–112)
Creatinine, Ser: 0.73 mg/dL (ref 0.50–1.10)
GFR calc Af Amer: >90 mL/min (ref >90)
GFR calc non Af Amer: >90 mL/min (ref >90)
Glucose, Bld: 122 mg/dL — ABNORMAL HIGH (ref 70–99)
Potassium: 4.1 mEq/L (ref 3.5–5.1)
Sodium: 137 mEq/L (ref 135–145)

## 2010-12-05 LAB — PROTIME-INR
INR: 0.89 (ref 0.00–1.49)
Prothrombin Time: 12.2 seconds (ref 11.6–15.2)

## 2010-12-05 LAB — CBC
HCT: 47.5 % — ABNORMAL HIGH (ref 36.0–46.0)
Hemoglobin: 16.9 g/dL — ABNORMAL HIGH (ref 12.0–15.0)
MCH: 35.3 pg — ABNORMAL HIGH (ref 26.0–34.0)
MCHC: 35.6 g/dL (ref 30.0–36.0)
MCV: 99.2 fL (ref 78.0–100.0)
Platelets: 226 10*3/uL (ref 150–400)
RBC: 4.79 MIL/uL (ref 3.87–5.11)
RDW: 12.6 % (ref 11.5–15.5)
WBC: 8 10*3/uL (ref 4.0–10.5)

## 2010-12-05 LAB — PRO B NATRIURETIC PEPTIDE: Pro B Natriuretic peptide (BNP): 1950 pg/mL — ABNORMAL HIGH (ref 0–125)

## 2010-12-05 LAB — HEPARIN LEVEL (UNFRACTIONATED): Heparin Unfractionated: 0.26 IU/mL — ABNORMAL LOW (ref 0.30–0.70)

## 2010-12-06 ENCOUNTER — Inpatient Hospital Stay (HOSPITAL_COMMUNITY): Payer: BC Managed Care – PPO

## 2010-12-06 LAB — CBC
HCT: 41.1 % (ref 36.0–46.0)
Hemoglobin: 14.2 g/dL (ref 12.0–15.0)
MCH: 34.1 pg — ABNORMAL HIGH (ref 26.0–34.0)
MCHC: 34.5 g/dL (ref 30.0–36.0)
MCV: 98.6 fL (ref 78.0–100.0)
Platelets: 184 10*3/uL (ref 150–400)
RBC: 4.17 MIL/uL (ref 3.87–5.11)
RDW: 12.7 % (ref 11.5–15.5)
WBC: 6.3 10*3/uL (ref 4.0–10.5)

## 2010-12-06 LAB — HEPARIN LEVEL (UNFRACTIONATED): Heparin Unfractionated: 0.35 IU/mL (ref 0.30–0.70)

## 2010-12-06 LAB — TSH: TSH: 1.427 u[IU]/mL (ref 0.350–4.500)

## 2010-12-14 NOTE — Discharge Summary (Signed)
  Catherine Callahan, Catherine Callahan NO.:  [id number]  MEDICAL RECORD NO.:  [id number]  LOCATION:  [id number]                         FACILITY:  [facility]  PHYSICIAN:  Belva Crome, M.D.   DATE OF BIRTH:  February 18, 1956  DATE OF ADMISSION:  12/05/2010 DATE OF DISCHARGE:  12/06/2010                              DISCHARGE SUMMARY   ADMITTING DIAGNOSIS:  Supraventricular tachycardia/atrial flutter.  DISCHARGE DIAGNOSES: 1. Paroxysmal atrial flutter with spontaneous reversion to normal     sinus rhythm. 2. Prior history of paroxysmal atrial fibrillation. 3. Obesity. 4. Gastroesophageal reflux disease. 5. Hyperlipidemia.  PROCEDURES PERFORMED:  None.  DISCHARGE/PLAN: 1. Resume typical activity. 2. Call if recurrent palpitations. 3. Follow up with Dr. Daneen Schick in 1 month to discuss clinical     course and medication adjustment.  MEDICATIONS AT DISCHARGE: 1. Metoprolol succinate 50 mg twice daily. 2. Asacol 800 mg daily. 3. Aspirin 81 mg daily. 4. Crestor 20 mg daily. 5. Glucosamine chondroitin 1 tablet daily. 6. Multiple vitamins 1 tablet daily.  RETURN TO WORK:  December 06, 2010, or thereafter.  RESTRICTIONS:  None.  HISTORY AND PHYSICAL AND HOSPITAL COURSE:  Please see the history and physical located on the patient's chart that was generated on our office on [employer].  The patient was given intravenous diltiazem in our office prior to deciding to send her to the hospital.  We monitored her for over an hour in the office, but she did not convert with this medication, although the rate was slowing significantly.  Upon arrival on the floor and after being placed on telemetry, and before IV diltiazem could be started.  She spontaneously reverted to normal sinus rhythm.  We increased her metoprolol dose to 50 mg twice daily.  She had no side effects or problems overnight.  Heparin was begun on admission.  No bleeding complications were noted.  Her laboratory data  included a hemoglobin of 16.9, platelet count of 226,000, normal white blood cell count, normal creatinine of 0.73, potassium 4.1.  The thyroid-stimulating hormone was 1.427.  Her BNP was noted to be 1951.  There were no complaints compatible with congestive heart failure.  After ambulating on the morning of discharge and with no recurrences of arrhythmia, the patient was discharged home in improved condition.     Belva Crome, M.D.     HWS/MEDQ  D:  12/06/2010  T:  12/06/2010  Job:  [id number]  Electronically Signed by Daneen Schick M.D. on 12/14/2010 11:01:58 AM

## 2011-01-03 ENCOUNTER — Inpatient Hospital Stay (HOSPITAL_COMMUNITY): Admission: AD | Admit: 2011-01-03 | Payer: Self-pay | Source: Ambulatory Visit | Admitting: Interventional Cardiology

## 2011-01-09 ENCOUNTER — Other Ambulatory Visit: Payer: Self-pay | Admitting: Interventional Cardiology

## 2011-01-10 ENCOUNTER — Other Ambulatory Visit: Payer: Self-pay

## 2011-01-10 ENCOUNTER — Other Ambulatory Visit: Payer: Self-pay | Admitting: Interventional Cardiology

## 2011-01-10 ENCOUNTER — Encounter (HOSPITAL_COMMUNITY): Payer: Self-pay | Admitting: General Practice

## 2011-01-10 ENCOUNTER — Inpatient Hospital Stay (HOSPITAL_COMMUNITY)
Admission: AD | Admit: 2011-01-10 | Discharge: 2011-01-12 | DRG: 139 | Disposition: A | Discharge status: Home / Self Care | Payer: BC Managed Care – PPO | Source: Ambulatory Visit | Attending: Interventional Cardiology | Admitting: Interventional Cardiology

## 2011-01-10 DIAGNOSIS — Z87891 Personal history of nicotine dependence: Secondary | ICD-10-CM

## 2011-01-10 DIAGNOSIS — Z880 Allergy status to penicillin: Secondary | ICD-10-CM

## 2011-01-10 DIAGNOSIS — M19049 Primary osteoarthritis, unspecified hand: Secondary | ICD-10-CM | POA: Diagnosis present

## 2011-01-10 DIAGNOSIS — K219 Gastro-esophageal reflux disease without esophagitis: Secondary | ICD-10-CM | POA: Diagnosis present

## 2011-01-10 DIAGNOSIS — I4891 Unspecified atrial fibrillation: Principal | ICD-10-CM | POA: Diagnosis not present

## 2011-01-10 DIAGNOSIS — I440 Atrioventricular block, first degree: Secondary | ICD-10-CM | POA: Diagnosis present

## 2011-01-10 DIAGNOSIS — Z5181 Encounter for therapeutic drug level monitoring: Secondary | ICD-10-CM

## 2011-01-10 DIAGNOSIS — Z7901 Long term (current) use of anticoagulants: Secondary | ICD-10-CM

## 2011-01-10 DIAGNOSIS — E782 Mixed hyperlipidemia: Secondary | ICD-10-CM | POA: Diagnosis present

## 2011-01-10 DIAGNOSIS — K5190 Ulcerative colitis, unspecified, without complications: Secondary | ICD-10-CM | POA: Diagnosis present

## 2011-01-10 DIAGNOSIS — Z79899 Other long term (current) drug therapy: Secondary | ICD-10-CM

## 2011-01-10 DIAGNOSIS — I10 Essential (primary) hypertension: Secondary | ICD-10-CM

## 2011-01-10 HISTORY — DX: Unspecified osteoarthritis, unspecified site: M19.90

## 2011-01-10 HISTORY — DX: Essential (primary) hypertension: I10

## 2011-01-10 LAB — CBC
HCT: 44.9 % (ref 36.0–46.0)
Hemoglobin: 15.6 g/dL — ABNORMAL HIGH (ref 12.0–15.0)
MCH: 34.7 pg — ABNORMAL HIGH (ref 26.0–34.0)
MCHC: 34.7 g/dL (ref 30.0–36.0)
MCV: 99.8 fL (ref 78.0–100.0)
Platelets: 197 10*3/uL (ref 150–400)
RBC: 4.5 MIL/uL (ref 3.87–5.11)
RDW: 12.5 % (ref 11.5–15.5)
WBC: 5.4 10*3/uL (ref 4.0–10.5)

## 2011-01-10 LAB — BASIC METABOLIC PANEL
BUN: 11 mg/dL (ref 6–23)
CO2: 29 mEq/L (ref 19–32)
Calcium: 9.8 mg/dL (ref 8.4–10.5)
Chloride: 103 mEq/L (ref 96–112)
Creatinine, Ser: 0.94 mg/dL (ref 0.50–1.10)
GFR calc Af Amer: 77 mL/min — ABNORMAL LOW (ref >90)
GFR calc non Af Amer: 67 mL/min — ABNORMAL LOW (ref >90)
Glucose, Bld: 119 mg/dL — ABNORMAL HIGH (ref 70–99)
Potassium: 4.2 mEq/L (ref 3.5–5.1)
Sodium: 142 mEq/L (ref 135–145)

## 2011-01-10 LAB — PROTIME-INR
INR: 1.74 — ABNORMAL HIGH (ref 0.00–1.49)
Prothrombin Time: 20.7 seconds — ABNORMAL HIGH (ref 11.6–15.2)

## 2011-01-10 LAB — MAGNESIUM: Magnesium: 1.8 mg/dL (ref 1.5–2.5)

## 2011-01-10 MED ORDER — ACETAMINOPHEN 650 MG RE SUPP: 650.0000 mg | Freq: Four times a day (QID) | RECTAL | Status: DC | PRN

## 2011-01-10 MED ORDER — SENNOSIDES-DOCUSATE SODIUM 8.6-50 MG PO TABS
1.0000 | ORAL_TABLET | Freq: Every day | ORAL | Status: DC | PRN
  Filled 2011-01-10: qty 1

## 2011-01-10 MED ORDER — SODIUM CHLORIDE 0.9 % IJ SOLN
3.0000 mL | Freq: Two times a day (BID) | INTRAMUSCULAR | Status: DC
  Administered 2011-01-10 – 2011-01-11 (×3): 3 mL via INTRAVENOUS

## 2011-01-10 MED ORDER — HYDROCODONE-ACETAMINOPHEN 5-325 MG PO TABS: 1.0000 | ORAL_TABLET | ORAL | Status: DC | PRN

## 2011-01-10 MED ORDER — ROSUVASTATIN CALCIUM 20 MG PO TABS
20.0000 mg | ORAL_TABLET | Freq: Every day | ORAL | Status: DC
  Administered 2011-01-10 – 2011-01-11 (×2): 20 mg via ORAL
  Filled 2011-01-10 (×3): qty 1

## 2011-01-10 MED ORDER — WARFARIN SODIUM 10 MG PO TABS
10.0000 mg | ORAL_TABLET | Freq: Once | ORAL | Status: DC
  Filled 2011-01-10: qty 1

## 2011-01-10 MED ORDER — ZOLPIDEM TARTRATE 5 MG PO TABS: 5.0000 mg | ORAL_TABLET | Freq: Every evening | ORAL | Status: DC | PRN

## 2011-01-10 MED ORDER — WARFARIN SODIUM 5 MG PO TABS
5.0000 mg | ORAL_TABLET | Freq: Once | ORAL | Status: AC
  Administered 2011-01-10: 5 mg via ORAL
  Filled 2011-01-10: qty 1

## 2011-01-10 MED ORDER — WARFARIN VIDEO
Freq: Once | Status: DC
  Filled 2011-01-10: qty 1

## 2011-01-10 MED ORDER — COUMADIN BOOK
Freq: Once | Status: AC
  Administered 2011-01-10: 12:00:00
  Filled 2011-01-10: qty 1

## 2011-01-10 MED ORDER — FLECAINIDE ACETATE 100 MG PO TABS
50.0000 mg | ORAL_TABLET | Freq: Two times a day (BID) | ORAL | Status: DC
  Filled 2011-01-10 (×2): qty 0.5

## 2011-01-10 MED ORDER — METOPROLOL TARTRATE 50 MG PO TABS
50.0000 mg | ORAL_TABLET | Freq: Two times a day (BID) | ORAL | Status: DC
  Administered 2011-01-10 – 2011-01-11 (×3): 50 mg via ORAL
  Filled 2011-01-10 (×6): qty 1

## 2011-01-10 MED ORDER — MESALAMINE 400 MG PO TBEC
800.0000 mg | DELAYED_RELEASE_TABLET | Freq: Every day | ORAL | Status: DC
  Filled 2011-01-10 (×3): qty 2

## 2011-01-10 MED ORDER — ACETAMINOPHEN 325 MG PO TABS: 650.0000 mg | ORAL_TABLET | Freq: Four times a day (QID) | ORAL | Status: DC | PRN

## 2011-01-10 MED ORDER — FLECAINIDE ACETATE 100 MG PO TABS
50.0000 mg | ORAL_TABLET | ORAL | Status: DC
  Administered 2011-01-10 – 2011-01-11 (×2): 50 mg via ORAL
  Filled 2011-01-10 (×5): qty 0.5

## 2011-01-11 ENCOUNTER — Encounter (HOSPITAL_COMMUNITY): Payer: Self-pay | Admitting: *Deleted

## 2011-01-11 ENCOUNTER — Other Ambulatory Visit: Payer: Self-pay

## 2011-01-11 DIAGNOSIS — I4891 Unspecified atrial fibrillation: Principal | ICD-10-CM | POA: Diagnosis not present

## 2011-01-11 DIAGNOSIS — Z79899 Other long term (current) drug therapy: Secondary | ICD-10-CM

## 2011-01-11 DIAGNOSIS — I10 Essential (primary) hypertension: Secondary | ICD-10-CM

## 2011-01-11 DIAGNOSIS — Z5181 Encounter for therapeutic drug level monitoring: Secondary | ICD-10-CM

## 2011-01-11 HISTORY — DX: Encounter for therapeutic drug level monitoring: Z51.81

## 2011-01-11 HISTORY — DX: Essential (primary) hypertension: I10

## 2011-01-11 LAB — PROTIME-INR
INR: 1.64 — ABNORMAL HIGH (ref 0.00–1.49)
Prothrombin Time: 19.7 seconds — ABNORMAL HIGH (ref 11.6–15.2)

## 2011-01-11 MED ORDER — FLECAINIDE ACETATE 50 MG PO TABS
50.0000 mg | ORAL_TABLET | ORAL | Status: DC
  Administered 2011-01-11 – 2011-01-12 (×2): 50 mg via ORAL
  Filled 2011-01-11 (×4): qty 1

## 2011-01-11 MED ORDER — WARFARIN SODIUM 5 MG PO TABS
5.0000 mg | ORAL_TABLET | Freq: Once | ORAL | Status: AC
  Administered 2011-01-11: 5 mg via ORAL
  Filled 2011-01-11: qty 1

## 2011-01-11 NOTE — Progress Notes (Signed)
Ms Stillion had QTC of 0.458 pre fleccanide and a QTC of 0.438 two hours post dose. Pt asymptomatic with medication and is to have 12 lead ekg in am.

## 2011-01-11 NOTE — Progress Notes (Signed)
  Patient Name: SOMONE Callahan Date of Encounter: 01/11/2011    SUBJECTIVE: And was admitted to hospital yesterday. She has now received 2 doses of flecainide. No side effects from the medication have been noted  TELEMETRY: Reviewed telemetry and no evidence of atrial fibrillation noted: Filed Vitals:   01/10/11 1400 01/10/11 2200 01/11/11 0600 01/11/11 0933  BP: 104/65 122/79 129/86 103/71  Pulse: 61 102 62 72  Temp: 98 F (36.7 C) 99.7 F (37.6 C) 97.8 F (36.6 C)   TempSrc: Oral  Oral   Resp: 20 14 18    Height:      Weight:      SpO2: 97% 97% 93%     Intake/Output Summary (Last 24 hours) at 01/11/11 1123 Last data filed at 01/11/11 0932  Gross per 24 hour  Intake    843 ml  Output      1 ml  Net    842 ml    LABS: Basic Metabolic Panel:  Basename 01/10/11 1125  NA 142  K 4.2  CL 103  CO2 29  GLUCOSE 119*  BUN 11  CREATININE 0.94  CALCIUM 9.8  MG 1.8  PHOS --   Liver Function Tests: No results found for this basename: AST:2,ALT:2,ALKPHOS:2,BILITOT:2,PROT:2,ALBUMIN:2 in the last 72 hours No results found for this basename: LIPASE:2,AMYLASE:2 in the last 72 hours CBC:  Basename 01/10/11 1125  WBC 5.4  NEUTROABS --  HGB 15.6*  HCT 44.9  MCV 99.8  PLT 197   Cardiac Enzymes: No results found for this basename: CKTOTAL:3,CKMB:3,CKMBINDEX:3,TROPONINI:3 in the last 72 hours BNP: No results found for this basename: POCBNP:3 in the last 72 hours D-Dimer: No results found for this basename: DDIMER:2 in the last 72 hours Hemoglobin A1C: No results found for this basename: HGBA1C in the last 72 hours Fasting Lipid Panel: No results found for this basename: CHOL,HDL,LDLCALC,TRIG,CHOLHDL,LDLDIRECT in the last 72 hours Thyroid Function Tests: No results found for this basename: TSH,T4TOTAL,FREET3,T3FREE,THYROIDAB in the last 72 hours Anemia Panel: No results found for this basename: VITAMINB12,FOLATE,FERRITIN,TIBC,IRON,RETICCTPCT in the last 72  hours  Radiology/Studies:  No results found.  Physical Exam: Blood pressure 103/71, pulse 72, temperature 97.8 F (36.6 C), temperature source Oral, resp. rate 18, height 5' 6.5" (1.689 m), weight 98.431 kg (217 lb), SpO2 93.00%. Weight change:   The lungs are clear auscultation and percussion. Cardiac exam is unremarkable. There is no peripheral edema.  ASSESSMENT AND PLAN:  Principal Problem:  *Encounter for long-term (current) use of other medications: the patient has received several doses of flecainide and has no side effects Active Problems:  Atrial fibrillation: no recurrences of atrial fibrillation noted since admission  Hypertension, essential, benign: Blood pressure control.   Plan: Patient is doing well. She has not had difficulty with flexion. The plan for discharge in a.m.  Demetrios Isaacs 01/11/2011, 11:23 AM

## 2011-01-11 NOTE — Progress Notes (Signed)
ANTICOAGULATION CONSULT NOTE - Follow Up Consult  Pharmacy Consult for coumadin Indication: atrial fibrillation  Allergies  Allergen Reactions  . Penicillins Anaphylaxis  . Meloxicam Hives  . Latex Rash    Patient Measurements: Height: 5' 6.5" (168.9 cm) Weight: 217 lb (98.431 kg) IBW/kg (Calculated) : 60.45  Adjusted Body Weight:   Vital Signs: Temp: 97.8 F (36.6 C) (11/08 0600) Temp src: Oral (11/08 0600) BP: 103/71 mmHg (11/08 0933) Pulse Rate: 72  (11/08 0933)  Labs:  Basename 01/11/11 0510 01/10/11 1125  HGB -- 15.6*  HCT -- 44.9  PLT -- 197  APTT -- --  LABPROT 19.7* 20.7*  INR 1.64* 1.74*  HEPARINUNFRC -- --  CREATININE -- 0.94  CKTOTAL -- --  CKMB -- --  TROPONINI -- --   Estimated Creatinine Clearance: 79.9 ml/min (by C-G formula based on Cr of 0.94).   Assessment: Patient is a 55 y.o F who was recently started on coumadin for afib.  Per patient, she took 5mg  daily of coumadin for 5 days last week.  INR was 4.8 on 11/05 and was instructed to skip dose on Monday and Tuesday of this week.  Patient received coumadin 5mg  dose last night with INR decreased slightly from 1.74 to 1.64 today.  She will likely require a weekly combination regimen of 5mg  and 2.5mg  doses.  Goal of Therapy:  INR 2-3   Plan:  1) Repeat coumadin 5mg  x1 today  Jorgeluis Gurganus Phuong Thi 01/11/2011,10:40 AM

## 2011-01-12 ENCOUNTER — Other Ambulatory Visit: Payer: Self-pay

## 2011-01-12 LAB — PROTIME-INR
INR: 1.66 — ABNORMAL HIGH (ref 0.00–1.49)
Prothrombin Time: 19.9 seconds — ABNORMAL HIGH (ref 11.6–15.2)

## 2011-01-12 NOTE — Discharge Summary (Signed)
  Patient ID: Catherine Callahan MRN: [id number] DOB/AGE: October 28, 1955 55 y.o.  Admit date: 01/10/2011 Discharge date: 01/12/2011  Primary Discharge Diagnosis: Paroxysmal atrial fibrillation Secondary Discharge Diagnosis: Potentially toxic medication loading  Significant Diagnostic Studies: None  Consults: none  Hospital Course: The patient has had paroxysmal atrial fibrillation. Because of frequent recurrences antiarrhythmic therapy was felt necessary. She was admitted to the hospital for flecainide loading. She was on continuous telemetric monitoring. No arrhythmias occurred. Diltiazem was discontinued. She tolerated flecainide without side effects. After 48 hours of loading and 5 doses of flecainide, she was felt elligible discharge.   Discharge Exam: Blood pressure 120/81, pulse 59, temperature 97.8 F (36.6 C), temperature source Oral, resp. rate 18, height 5' 6.5" (1.689 m), weight 98.431 kg (217 lb), SpO2 97.00%.  Her exam is unchanged from admission to Labs:INR/Prothrombin Time 1.66       Radiology: no significant studies  EKG: first-degree block but otherwise unremarkable   FOLLOW UP PLANS AND APPOINTMENTS Discharge Orders    Future Orders Please Complete By Expires   Diet - low sodium heart healthy      Increase activity slowly      Discharge instructions      Comments:   Please call if palpitations. Call if there are side effects to the medication.     Current Discharge Medication List    CONTINUE these medications which have NOT CHANGED   Details  flecainide (TAMBOCOR) 50 MG tablet Take 50 mg by mouth 2 (two) times daily.      Mesalamine (ASACOL HD) 800 MG TBEC Take 800 mg by mouth daily.      metoprolol (TOPROL-XL) 50 MG 24 hr tablet Take 50 mg by mouth 2 (two) times daily.      Multiple Vitamins-Minerals (MULTIVITAMINS THER. W/MINERALS) TABS Take 1 tablet by mouth daily.      rosuvastatin (CRESTOR) 20 MG tablet Take 20 mg by mouth daily.      warfarin  (COUMADIN) 5 MG tablet Take 5 mg by mouth daily.        STOP taking these medications     diltiazem (CARDIZEM CD) 120 MG 24 hr capsule        Follow-up Information    Follow up with Sinclair Grooms on 01/22/2011. ( 9:15 AM)    Contact information:   [address] [place], [place]. [place] [phone]          BRING ALL MEDICATIONS WITH YOU TO FOLLOW UP APPOINTMENTS  Time spent with patient to include physician time: 30 minutes  Signed: Sinclair Grooms 01/12/2011, 7:52 AM

## 2011-01-12 NOTE — H&P (Signed)
  Please see the appended / scanned admission note from my office.

## 2011-02-08 ENCOUNTER — Other Ambulatory Visit (HOSPITAL_COMMUNITY): Payer: Self-pay | Admitting: Family Medicine

## 2011-02-08 DIAGNOSIS — Z1231 Encounter for screening mammogram for malignant neoplasm of breast: Secondary | ICD-10-CM

## 2011-03-16 ENCOUNTER — Ambulatory Visit (HOSPITAL_COMMUNITY)
Admission: RE | Admit: 2011-03-16 | Discharge: 2011-03-16 | Disposition: A | Discharge status: Home / Self Care | Payer: BC Managed Care – PPO | Source: Ambulatory Visit | Attending: Family Medicine | Admitting: Family Medicine

## 2011-03-16 DIAGNOSIS — Z1231 Encounter for screening mammogram for malignant neoplasm of breast: Secondary | ICD-10-CM

## 2012-01-03 ENCOUNTER — Other Ambulatory Visit: Payer: Self-pay | Admitting: Family Medicine

## 2012-01-03 ENCOUNTER — Other Ambulatory Visit (HOSPITAL_COMMUNITY)
Admission: RE | Admit: 2012-01-03 | Discharge: 2012-01-03 | Disposition: A | Discharge status: Home / Self Care | Payer: BC Managed Care – PPO | Source: Ambulatory Visit | Attending: Family Medicine | Admitting: Family Medicine

## 2012-01-03 DIAGNOSIS — Z0000 Encounter for general adult medical examination without abnormal findings: Secondary | ICD-10-CM | POA: Insufficient documentation

## 2013-01-01 ENCOUNTER — Encounter: Payer: Self-pay | Admitting: Interventional Cardiology

## 2013-01-01 ENCOUNTER — Telehealth: Payer: Self-pay | Admitting: Interventional Cardiology

## 2013-01-01 MED ORDER — FLECAINIDE ACETATE 50 MG PO TABS: 100.0000 mg | ORAL_TABLET | Freq: Two times a day (BID) | ORAL | Status: DC

## 2013-01-01 MED ORDER — FLECAINIDE ACETATE 100 MG PO TABS: 100.0000 mg | ORAL_TABLET | Freq: Two times a day (BID) | ORAL | Status: DC

## 2013-01-01 NOTE — Telephone Encounter (Signed)
Pt will continue current medication and will see Dr. Katrinka Blazing on Monday 01/05/13 at 4 PM. Pt verbalized understanding.

## 2013-01-01 NOTE — Telephone Encounter (Signed)
Yes. Please also change her medication profile to reflect the correct Flecainide dose.

## 2013-01-01 NOTE — Telephone Encounter (Signed)
Pt called because for the last 5 days she has been in and out of rhythm and stay out of rhythm even after taking an extra Metoprolol 25 mg. Before when she was out of rhythm, Pt took an extra 25 mg of Metoprolol and she would convert  to Sinus rhythm soon after.Yesterday pt was out of rhythm from 1 to 7:45 PM. Pt also states she has been having a headache for the last few days; BP has been 150/108, this AM 130/100. Pt spoke with Dr. Mayford Knife the on call MD which recommended for pt to call the office today to see if Dr. Katrinka Blazing wants to increase pt's Metoprolol. Pt takes Metoprolol Succinate 50 mg tablet twice a day.

## 2013-01-01 NOTE — Telephone Encounter (Signed)
Increase the Flecainide to 75 mg BID (1.5 tablets BID) See me in office next week for ECG and f/u

## 2013-01-01 NOTE — Telephone Encounter (Signed)
New Problem     In the pass 8 day in Afib,   Afib 5day  5x  10/29  150/108 pm,  Pt also has a headache.  Pt has question about increasing med. Pt is on metoporol 50 bid &  Flecainide 100 bid succ.  Thanks!

## 2013-01-01 NOTE — Telephone Encounter (Signed)
Pt states she is already taking Flecainide  100 mg twice a day. There is no openings on your schedule next week to placed pt ; is it okay to overbook?

## 2013-01-05 ENCOUNTER — Other Ambulatory Visit: Payer: Self-pay

## 2013-01-05 ENCOUNTER — Encounter: Payer: Self-pay | Admitting: Interventional Cardiology

## 2013-01-05 ENCOUNTER — Ambulatory Visit (INDEPENDENT_AMBULATORY_CARE_PROVIDER_SITE_OTHER): Payer: BC Managed Care – PPO | Admitting: Interventional Cardiology

## 2013-01-05 VITALS — BP 124/84 | HR 71 | Ht 66.0 in | Wt 226.0 lb

## 2013-01-05 DIAGNOSIS — Z9989 Dependence on other enabling machines and devices: Secondary | ICD-10-CM | POA: Insufficient documentation

## 2013-01-05 DIAGNOSIS — G4733 Obstructive sleep apnea (adult) (pediatric): Secondary | ICD-10-CM

## 2013-01-05 DIAGNOSIS — I10 Essential (primary) hypertension: Secondary | ICD-10-CM

## 2013-01-05 DIAGNOSIS — I4891 Unspecified atrial fibrillation: Secondary | ICD-10-CM

## 2013-01-05 DIAGNOSIS — Z79899 Other long term (current) drug therapy: Secondary | ICD-10-CM

## 2013-01-05 DIAGNOSIS — I4892 Unspecified atrial flutter: Secondary | ICD-10-CM

## 2013-01-05 HISTORY — DX: Obstructive sleep apnea (adult) (pediatric): Z99.89

## 2013-01-05 HISTORY — DX: Obstructive sleep apnea (adult) (pediatric): G47.33

## 2013-01-05 HISTORY — DX: Unspecified atrial flutter: I48.92

## 2013-01-05 MED ORDER — FLECAINIDE ACETATE 150 MG PO TABS: 150.0000 mg | ORAL_TABLET | Freq: Two times a day (BID) | ORAL | Status: DC

## 2013-01-05 MED ORDER — FLECAINIDE ACETATE 100 MG PO TABS: 150.0000 mg | ORAL_TABLET | Freq: Two times a day (BID) | ORAL | Status: DC

## 2013-01-05 NOTE — Progress Notes (Signed)
Patient ID: Catherine Callahan, female   DOB: 07-09-1955, 57 y.o.   MRN: [id number]    [address] [place], [place]  [address] Phone: [phone] Fax:  [phone]  Date:  01/05/2013   ID:  Catherine Callahan, DOB 1955/09/13, MRN [id number]  PCP:  Reginia Naas, MD   ASSESSMENT:  1. Paroxysmal atrial fibrillation 2. History of paroxysmal atrial flutter 3. Obstructive sleep apnea currently on CPAP therapy 4. Hypertension 5. Moderate obesity  PLAN:  1. Increase flecainide 150 mg twice daily 2. EP consultation with Dr. Rayann Heman   SUBJECTIVE: Catherine Callahan is a 57 y.o. female  with a history of previously documented atrial flutter at least one episode of atrial fibrillation. She has normal LV function by echocardiography done in 2012 with EF greater than 60%. Left atrial size is normal. She has obstructive sleep apnea that has been well treated with CPAP. Over the past week or 2 she has noticed increased episodes of atrial fibrillation (irregular ventricular response; the patient is a Equities trader). One episode lasted nearly 7 hours. She called because of concerns that therapy does not seem to be working as well as previous.   Wt Readings from Last 3 Encounters:  01/05/13 226 lb (102.513 kg)  01/10/11 217 lb (98.431 kg)     Past Medical History  Diagnosis Date  . Dysrhythmia     Atrial flutter  . Arthritis   . Hypertension, essential, benign 01/11/2011    Current Outpatient Prescriptions  Medication Sig Dispense Refill  . aspirin 81 MG tablet Take 81 mg by mouth daily.      . balsalazide (COLAZAL) 750 MG capsule Take 2,250 mg by mouth 2 (two) times daily.       . flecainide (TAMBOCOR) 100 MG tablet Take 1 tablet (100 mg total) by mouth 2 (two) times daily.      . Mesalamine (ASACOL HD) 800 MG TBEC Take 800 mg by mouth daily.        . metoprolol (TOPROL-XL) 50 MG 24 hr tablet Take 50 mg by mouth 2 (two) times daily.        . Multiple  Vitamins-Minerals (MULTIVITAMINS THER. W/MINERALS) TABS Take 1 tablet by mouth daily.        . rosuvastatin (CRESTOR) 20 MG tablet Take 40 mg by mouth daily.        No current facility-administered medications for this visit.    Allergies:    Allergies  Allergen Reactions  . Penicillins Anaphylaxis  . Meloxicam Hives  . Latex Rash    Social History:  The patient  reports that she has never smoked. She does not have any smokeless tobacco history on file.   ROS:  Please see the history of present illness.   Increasing obesity. Denies syncope. No chest pain.   All other systems reviewed and negative.   OBJECTIVE: VS:  BP 124/84  Pulse 71  Ht 5\' 6"  (1.676 m)  Wt 226 lb (102.513 kg)  BMI 36.49 kg/m2 Well nourished, well developed, in no acute distress, obese HEENT: normal Neck: JVD flat. Carotid bruit no bruits  Cardiac:  normal S1, S2; RRR; no murmur Lungs:  clear to auscultation bilaterally, no wheezing, rhonchi or rales Abd: soft, nontender, no hepatomegaly Ext: Edema absent. Pulses 2+ Skin: warm and dry Neuro:  CNs 2-12 intact, no focal abnormalities noted  EKG:  First degree AV block at 234 ms, QTC 445 ms, nonspecific T-wave abnormality.  Signed, Illene Labrador III, MD 01/05/2013 4:41 PM

## 2013-01-05 NOTE — Patient Instructions (Signed)
Increase Flecainide to 150mg  twice daily  You have been referred to Dr.James Allred for a consultation to consider an Ablation(Afib,Aflutter).

## 2013-01-28 ENCOUNTER — Ambulatory Visit (INDEPENDENT_AMBULATORY_CARE_PROVIDER_SITE_OTHER): Payer: BC Managed Care – PPO | Admitting: Internal Medicine

## 2013-01-28 ENCOUNTER — Encounter: Payer: Self-pay | Admitting: Internal Medicine

## 2013-01-28 VITALS — BP 134/84 | HR 62 | Ht 66.0 in | Wt 231.0 lb

## 2013-01-28 DIAGNOSIS — I4891 Unspecified atrial fibrillation: Secondary | ICD-10-CM

## 2013-01-28 DIAGNOSIS — Z9989 Dependence on other enabling machines and devices: Secondary | ICD-10-CM

## 2013-01-28 DIAGNOSIS — I4892 Unspecified atrial flutter: Secondary | ICD-10-CM

## 2013-01-28 DIAGNOSIS — G4733 Obstructive sleep apnea (adult) (pediatric): Secondary | ICD-10-CM

## 2013-01-28 NOTE — Progress Notes (Signed)
Primary Care Physician: Reginia Naas, MD Referring Physician:  Dr Daneen Schick   Catherine Callahan is a 57 y.o. female with a h/o sleep apnea, paroxysmal atrial fibrillation and atrial flutter who presents today for EP consultation.  She reports that she was diagnosed with atrial flutter 2 years ago upon presenting to the ER with symptoms of tachypalpitations.  She was admitted and placed on metoprolol.  She returned with recurrent afib/ atrial flutter and was placed on flecainide.  She reports increasing frequency and duration of atrial arrhythmias since that time.  She has increased flecainide but continues to have these despite flecainide 150mg  BID.  Episodes occur 5-6 times per month presently and last 1-2 hours at a time.  She reports symptoms of decreased exercise tolerance with this.  She notices the palpitations primarily.  Today, she denies symptoms of  chest pain, shortness of breath, orthopnea, PND, lower extremity edema, dizziness, presyncope, syncope, or neurologic sequela. The patient is tolerating medications without difficulties and is otherwise without complaint today.   Past Medical History  Diagnosis Date  . Paroxysmal atrial fibrillation   . Arthritis   . Hypertension, essential, benign 01/11/2011    pt denies this  . OSA on CPAP 01/05/2013  . Encounter for long-term (current) use of other medications 01/11/2011  . Atrial flutter 01/05/2013  . Overweight   . Ulcerative colitis    Past Surgical History  Procedure Laterality Date  . Colon surgery  2004    colon resection    Current Outpatient Prescriptions  Medication Sig Dispense Refill  . aspirin 81 MG tablet Take 81 mg by mouth daily.      . balsalazide (COLAZAL) 750 MG capsule Take 2,250 mg by mouth 3 (three) times daily. Alternate with ASACOL      . flecainide (TAMBOCOR) 150 MG tablet Take 1 tablet (150 mg total) by mouth 2 (two) times daily.  270 tablet  3  . Mesalamine (ASACOL HD) 800 MG TBEC Take 800 mg  by mouth daily. Alternate with COLAZAL      . metoprolol (TOPROL-XL) 50 MG 24 hr tablet Take 50 mg by mouth 2 (two) times daily.        . Multiple Vitamins-Minerals (MULTIVITAMINS THER. W/MINERALS) TABS Take 1 tablet by mouth daily.        . rosuvastatin (CRESTOR) 20 MG tablet Take 40 mg by mouth daily.        No current facility-administered medications for this visit.    Allergies  Allergen Reactions  . Penicillins Anaphylaxis  . Meloxicam Hives  . Latex Rash    History   Social History  . Marital Status: Widowed    Spouse Name: N/A    Number of Children: N/A  . Years of Education: N/A   Occupational History  . Not on file.   Social History Main Topics  . Smoking status: Never Smoker   . Smokeless tobacco: Not on file  . Alcohol Use: No  . Drug Use: No  . Sexual Activity: Not on file   Other Topics Concern  . Not on file   Social History Narrative   Pt lives in [place] alone.  Widowed.  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    Family History  Problem Relation Age of Onset  . Atrial fibrillation Brother 60  . CAD Brother 63    ROS- All systems are reviewed and negative except  as per the HPI above  Physical Exam: Filed Vitals:   01/28/13 0820  BP: 134/84  Pulse: 62  Height: 5\' 6"  (1.676 m)  Weight: 231 lb (104.781 kg)    GEN- The patient is overweight ppearing, alert and oriented x 3 today.   Head- normocephalic, atraumatic Eyes-  Sclera clear, conjunctiva pink Ears- hearing intact Oropharynx- clear Neck- supple, no JVP Lymph- no cervical lymphadenopathy Lungs- Clear to ausculation bilaterally, normal work of breathing Heart- Regular rate and rhythm, no murmurs, rubs or gallops, PMI not laterally displaced GI- soft, NT, ND, + BS Extremities- no clubbing, cyanosis, or edema MS- no significant deformity or atrophy Skin- no rash or lesion Psych- euthymic mood, full  affect Neuro- strength and sensation are intact  EKG 01/06/13 reveals sinus rhythm, PR 234, Qtc 445, otherwise normal ekg Dr Darliss Ridgel notes are reviewed  Assessment and Plan:  1. Paroxysmal atrial fibrillation and atrial flutter The patient has symptomatic recurrent atrial arrhythmias despite medical therapy with metoprolol and flecainide.  Her CHADSD2VASC score is 1.  She is appropriately anticoagulated with ASA per guidelines.  Last echo was 2012 (not available for review today) but per Dr Darliss Ridgel note did not reveals structural changes at that time. Therapeutic strategies for afib and atrial flutter including medicine and ablation were discussed in detail with the patient today. Risk, benefits, and alternatives to EP study and radiofrequency ablation were also discussed in detail today. These risks include but are not limited to stroke, bleeding, vascular damage, tamponade, perforation, damage to the esophagus, lungs, and other structures, pulmonary vein stenosis, worsening renal function, and death. The patient understands these risk and wishes to further contemplate this option.  She will contact my office if she wishes to proceed.  I have given her handout information on afib and ablation today.  SHe would require initiation of xarelto for at least 3 weeks prior to ablation. I would recommend a repeat echo.  She wishes to wait until after the first of the year for this.  2. OSA Compliance with CPAP is encouraged  She will follow up with Dr Tamala Julian and I will see as needed should she decide to proceed with ablation.

## 2013-01-28 NOTE — Patient Instructions (Signed)
Your physician has recommended that you have an ablation. Catheter ablation is a medical procedure used to treat some cardiac arrhythmias (irregular heartbeats). During catheter ablation, a long, thin, flexible tube is put into a blood vessel in your groin (upper thigh), or neck. This tube is called an ablation catheter. It is then guided to your heart through the blood vessel. Radio frequency waves destroy small areas of heart tissue where abnormal heartbeats may cause an arrhythmia to start. Please see the instruction sheet given to you today.  Call Anselm Pancoast at [phone] if you decide to proceed with ablation  Your physician recommends that you schedule a follow-up appointment as needed with Dr Johney Frame

## 2013-02-12 ENCOUNTER — Telehealth: Payer: Self-pay | Admitting: Interventional Cardiology

## 2013-02-12 DIAGNOSIS — I4891 Unspecified atrial fibrillation: Secondary | ICD-10-CM

## 2013-02-12 NOTE — Telephone Encounter (Signed)
New Message  Pt request a second opinion regarding ablation// Please call back with recommendations//SR

## 2013-02-17 NOTE — Telephone Encounter (Signed)
Refer the patient to Dr. Macon Large at [facility] along with pertinent clinical data to evaluate the patient for management of atrial arrhythmia including the possibility of ablation

## 2013-02-18 NOTE — Telephone Encounter (Signed)
returned pt call.pt adv per Dr.Smith he will ref her to Dr.Bahnson at [facility] for a second opinion on having a ablation.adv her that someone from our scheduling dept/orDr.Bahnson office will call her to set a an appt.pt verbalized understanding.

## 2013-03-25 DIAGNOSIS — K5190 Ulcerative colitis, unspecified, without complications: Secondary | ICD-10-CM | POA: Insufficient documentation

## 2013-03-25 DIAGNOSIS — M1990 Unspecified osteoarthritis, unspecified site: Secondary | ICD-10-CM | POA: Insufficient documentation

## 2013-03-25 DIAGNOSIS — I10 Essential (primary) hypertension: Secondary | ICD-10-CM | POA: Insufficient documentation

## 2013-03-25 DIAGNOSIS — E663 Overweight: Secondary | ICD-10-CM | POA: Insufficient documentation

## 2013-04-15 ENCOUNTER — Encounter (INDEPENDENT_AMBULATORY_CARE_PROVIDER_SITE_OTHER): Payer: Self-pay

## 2013-04-15 ENCOUNTER — Ambulatory Visit (INDEPENDENT_AMBULATORY_CARE_PROVIDER_SITE_OTHER): Payer: BC Managed Care – PPO | Admitting: Cardiology

## 2013-04-15 ENCOUNTER — Encounter: Payer: Self-pay | Admitting: Cardiology

## 2013-04-15 VITALS — BP 113/71 | HR 64 | Ht 66.0 in | Wt 231.0 lb

## 2013-04-15 DIAGNOSIS — Z9989 Dependence on other enabling machines and devices: Secondary | ICD-10-CM

## 2013-04-15 DIAGNOSIS — G4733 Obstructive sleep apnea (adult) (pediatric): Secondary | ICD-10-CM

## 2013-04-15 DIAGNOSIS — E669 Obesity, unspecified: Secondary | ICD-10-CM | POA: Insufficient documentation

## 2013-04-15 DIAGNOSIS — I10 Essential (primary) hypertension: Secondary | ICD-10-CM

## 2013-04-15 NOTE — Patient Instructions (Signed)
Your physician recommends that you continue on your current medications as directed. Please refer to the Current Medication list given to you today.  Your physician wants you to follow-up in: 6 months with Dr Turner You will receive a reminder letter in the mail two months in advance. If you don't receive a letter, please call our office to schedule the follow-up appointment.  

## 2013-04-15 NOTE — Progress Notes (Signed)
[place], [address] [place], [place]  [address] Phone: [phone] Fax:  [phone]  Date:  04/15/2013   ID:  LATONA PARNES, DOB [birth year], MRN [id number]  PCP:  Reginia Naas, MD  Sleep Medicine:  Fransico Him, MD   History of Present Illness: Catherine Callahan is a 58 y.o. female with a history of OSA, HTN and obesity who presents today for followup.  She is doing well.  She tolerates her CPAP therapy without any problems.  She still feels rested in the am but not as much as when she first started CPAP.  She has no daytime sleepiness.  She tolerates her full face mask and feels the pressure is adequate.  She walks on the treadmill 3-5 times weekly for 30 minutes.   Wt Readings from Last 3 Encounters:  04/15/13 231 lb (104.781 kg)  01/28/13 231 lb (104.781 kg)  01/05/13 226 lb (102.513 kg)     Past Medical History  Diagnosis Date  . Paroxysmal atrial fibrillation   . Arthritis   . Hypertension, essential, benign 01/11/2011    pt denies this  . OSA on CPAP 01/05/2013  . Encounter for long-term (current) use of other medications 01/11/2011  . Atrial flutter 01/05/2013  . Overweight   . Ulcerative colitis     Current Outpatient Prescriptions  Medication Sig Dispense Refill  . aspirin 81 MG tablet Take 81 mg by mouth daily.      . balsalazide (COLAZAL) 750 MG capsule Take 1,500 mg by mouth daily. Alternate with ASACOL      . flecainide (TAMBOCOR) 150 MG tablet Take 1 tablet (150 mg total) by mouth 2 (two) times daily.  270 tablet  3  . Mesalamine (ASACOL HD) 800 MG TBEC Take 800 mg by mouth daily. Alternate with COLAZAL      . metoprolol (TOPROL-XL) 50 MG 24 hr tablet Take 50 mg by mouth 2 (two) times daily.        . Multiple Vitamin (MULTI-VITAMINS) TABS Take by mouth daily.      . rosuvastatin (CRESTOR) 40 MG tablet Take 40 mg by mouth daily.       No current facility-administered medications for this visit.    Allergies:    Allergies  Allergen  Reactions  . Penicillins Anaphylaxis  . Meloxicam Hives  . Latex Rash    Social History:  The patient  reports that she has never smoked. She does not have any smokeless tobacco history on file. She reports that she does not drink alcohol or use illicit drugs.   Family History:  The patient's family history includes Atrial fibrillation (age of onset: 34) in her brother; CAD (age of onset: 35) in her brother.   ROS:  Please see the history of present illness.      All other systems reviewed and negative.   PHYSICAL EXAM: VS:  BP 113/71  Pulse 64  Ht 5\' 6"  (1.676 m)  Wt 231 lb (104.781 kg)  BMI 37.30 kg/m2 Well nourished, well developed, in no acute distress HEENT: normal Neck: no JVD Cardiac:  normal S1, S2; RRR; no murmur Lungs:  clear to auscultation bilaterally, no wheezing, rhonchi or rales Abd: soft, nontender, no hepatomegaly Ext: no edema Skin: warm and dry Neuro:  CNs 2-12 intact, no focal abnormalities noted       ASSESSMENT AND PLAN:  1. OSA on CPAP - her download today showed an AHI of 2.6/hr on 14cm H2O with 100% compliance in  using more than 4 hours nightly 2. Obesity - I have encouarged her to increase her aerobic exericse to 60 minutes to try to drop some weight 3. HTN - well controlled  - continue metoprolol  Followup with me in 6 months  Signed, Fransico Him, MD 04/15/2013 10:43 AM

## 2013-04-16 ENCOUNTER — Telehealth: Payer: Self-pay | Admitting: Interventional Cardiology

## 2013-04-16 NOTE — Telephone Encounter (Signed)
New message    According to Dr Omelia Blackwater at [facility] pt needs an echo.  Have you received the order?

## 2013-04-22 NOTE — Telephone Encounter (Signed)
Pt echo sch for 04/23/13

## 2013-04-23 ENCOUNTER — Ambulatory Visit (HOSPITAL_COMMUNITY): Payer: BC Managed Care – PPO | Attending: Internal Medicine | Admitting: Radiology

## 2013-04-23 ENCOUNTER — Other Ambulatory Visit (HOSPITAL_COMMUNITY): Payer: Self-pay | Admitting: Radiology

## 2013-04-23 ENCOUNTER — Encounter: Payer: Self-pay | Admitting: Internal Medicine

## 2013-04-23 DIAGNOSIS — I10 Essential (primary) hypertension: Secondary | ICD-10-CM | POA: Insufficient documentation

## 2013-04-23 DIAGNOSIS — I079 Rheumatic tricuspid valve disease, unspecified: Secondary | ICD-10-CM | POA: Insufficient documentation

## 2013-04-23 DIAGNOSIS — I4891 Unspecified atrial fibrillation: Secondary | ICD-10-CM

## 2013-04-23 DIAGNOSIS — I4892 Unspecified atrial flutter: Secondary | ICD-10-CM

## 2013-04-23 DIAGNOSIS — E669 Obesity, unspecified: Secondary | ICD-10-CM | POA: Insufficient documentation

## 2013-04-23 DIAGNOSIS — I059 Rheumatic mitral valve disease, unspecified: Secondary | ICD-10-CM | POA: Insufficient documentation

## 2013-04-23 NOTE — Progress Notes (Signed)
Echocardiogram performed.  

## 2013-05-01 ENCOUNTER — Telehealth: Payer: Self-pay

## 2013-05-01 NOTE — Telephone Encounter (Signed)
Message copied by Jarvis NewcomerPARRIS-GODLEY, Blaiden Werth S on Fri May 01, 2013 11:48 AM ------      Message from: Verdis PrimeSMITH, HENRY      Created: Wed Apr 29, 2013  3:29 PM       We need to send a copy to [facility]. Forget what I said about Dr Johney FrameAllred. ------

## 2013-05-01 NOTE — Telephone Encounter (Signed)
lmom.Normal LV function with moderate LA enlargement.Normal EF, no significant abnormalities.copy sent to We need  to [facility].

## 2013-05-19 ENCOUNTER — Other Ambulatory Visit: Payer: Self-pay

## 2013-05-19 MED ORDER — METOPROLOL SUCCINATE ER 50 MG PO TB24: 50.0000 mg | ORAL_TABLET | Freq: Two times a day (BID) | ORAL | Status: DC

## 2013-06-03 ENCOUNTER — Other Ambulatory Visit: Payer: Self-pay | Admitting: Interventional Cardiology

## 2013-06-03 ENCOUNTER — Ambulatory Visit: Payer: BC Managed Care – PPO | Admitting: Interventional Cardiology

## 2013-06-08 DIAGNOSIS — K5730 Diverticulosis of large intestine without perforation or abscess without bleeding: Secondary | ICD-10-CM | POA: Insufficient documentation

## 2013-11-10 ENCOUNTER — Telehealth: Payer: Self-pay | Admitting: Interventional Cardiology

## 2013-11-10 NOTE — Telephone Encounter (Signed)
returned pt call pt sts that she had some break through afib on Sunday that lasted for a couple og hours. pt sts that she converted back to NSR withher second dose od Metoprolol .pt sts that she went back into afib some time last night. pt is asymptomatic.pt sts that she has taken an additional metoprolol  today. Pt sts that she is able to tell when she is out of rhythm. And she can feel her heart slowing down.adv her that Dr.Smith is out of the office today, I will talk with another cardiologist in the office and call back with their recommendations. Pt agreeable and verbalized understanding.  Pt given Dr.Cooper's recommendations. If at bedtime pt heartrate is still in the 110's pt should go ahead and take an additional Metoprolol .pt verbalized understanding.

## 2013-11-10 NOTE — Telephone Encounter (Signed)
New problem:    Pt is in AFIB with VR Of over 100 for 12 hr took extra Topaol took 3,  25 mg  Per pt A systematic.

## 2013-11-11 NOTE — Telephone Encounter (Signed)
called to ck on pt. she sts that she converted to NSR last nigh aroound 8pm. pt sts that she has maintained NSR since then. pt sts that she feels ok. pt is heading out of town on 9/11 for 1 wk, and wanted to know if it was ok for her to take up to an additional  of Metoprolol if needed for afib. Adv her I would talk with Dr.Smith and call back if he was not agreeable with plan. Pt verbalized understanding.

## 2013-11-24 ENCOUNTER — Other Ambulatory Visit: Payer: Self-pay | Admitting: *Deleted

## 2013-11-24 MED ORDER — METOPROLOL SUCCINATE ER 50 MG PO TB24: ORAL_TABLET | ORAL | Status: DC

## 2013-12-11 ENCOUNTER — Telehealth: Payer: Self-pay | Admitting: Cardiology

## 2013-12-11 DIAGNOSIS — G4733 Obstructive sleep apnea (adult) (pediatric): Secondary | ICD-10-CM

## 2013-12-11 NOTE — Telephone Encounter (Signed)
DME supply order place for CPAP.

## 2013-12-23 ENCOUNTER — Other Ambulatory Visit: Payer: Self-pay | Admitting: Interventional Cardiology

## 2013-12-25 ENCOUNTER — Other Ambulatory Visit: Payer: Self-pay | Admitting: Interventional Cardiology

## 2014-01-04 ENCOUNTER — Encounter: Payer: Self-pay | Admitting: Cardiology

## 2014-01-04 ENCOUNTER — Ambulatory Visit (INDEPENDENT_AMBULATORY_CARE_PROVIDER_SITE_OTHER): Payer: BC Managed Care – PPO | Admitting: Cardiology

## 2014-01-04 VITALS — BP 128/82 | HR 68 | Ht 66.0 in | Wt 229.4 lb

## 2014-01-04 DIAGNOSIS — Z9989 Dependence on other enabling machines and devices: Secondary | ICD-10-CM

## 2014-01-04 DIAGNOSIS — G4733 Obstructive sleep apnea (adult) (pediatric): Secondary | ICD-10-CM

## 2014-01-04 DIAGNOSIS — I10 Essential (primary) hypertension: Secondary | ICD-10-CM

## 2014-01-04 DIAGNOSIS — E669 Obesity, unspecified: Secondary | ICD-10-CM

## 2014-01-04 NOTE — Progress Notes (Signed)
[place], [address] [place], [place]  [address] Phone: [phone] Fax:  [phone]  Date:  01/04/2014   ID:  Catherine Callahan, DOB 12/15/1955, MRN [id number]  PCP:  Catherine Naas, MD  Cardiologist:  Catherine Him, MD    History of Present Illness: Catherine Callahan is a 58 y.o. female with a history of OSA, HTN and obesity who presents today for followup. She is doing well. She tolerates her CPAP therapy without any problems. She still feels rested in the am. She has no daytime sleepiness. She tolerates her full face mask and feels the pressure is adequate. She walks on the treadmill 3-5 times weekly for 30 minutes.  She no longer has any problems with dry nose or nasal congestion.   Wt Readings from Last 3 Encounters:  01/04/14 229 lb 6.4 oz (104.055 kg)  04/15/13 231 lb (104.781 kg)  01/28/13 231 lb (104.781 kg)     Past Medical History  Diagnosis Date  . Paroxysmal atrial fibrillation   . Arthritis   . Hypertension, essential, benign 01/11/2011    pt denies this  . OSA on CPAP 01/05/2013  . Encounter for long-term (current) use of other medications 01/11/2011  . Atrial flutter 01/05/2013  . Overweight   . Ulcerative colitis     Current Outpatient Prescriptions  Medication Sig Dispense Refill  . aspirin 81 MG tablet Take 81 mg by mouth daily.    . balsalazide (COLAZAL) 750 MG capsule Take 1,500 mg by mouth daily. Alternate with ASACOL    . flecainide (TAMBOCOR) 150 MG tablet Take 1 tablet (150 mg total) by mouth 2 (two) times daily. 270 tablet 3  . Mesalamine (ASACOL HD) 800 MG TBEC Take 800 mg by mouth daily. Alternate with COLAZAL    . metoprolol succinate (TOPROL-XL) 50 MG 24 hr tablet TAKE 1 TABLET TWICE A DAY ORALLY 60 tablet 0  . Multiple Vitamin (MULTI-VITAMINS) TABS Take by mouth daily.    . rosuvastatin (CRESTOR) 40 MG tablet Take 40 mg by mouth daily.     No current facility-administered medications for this visit.    Allergies:      Allergies  Allergen Reactions  . Penicillins Anaphylaxis  . Meloxicam Hives  . Latex Rash    Social History:  The patient  reports that she has never smoked. She does not have any smokeless tobacco history on file. She reports that she does not drink alcohol or use illicit drugs.   Family History:  The patient's family history includes Atrial fibrillation (age of onset: 66) in her brother; CAD (age of onset: 62) in her brother.   ROS:  Please see the history of present illness.      All other systems reviewed and negative.   PHYSICAL EXAM: VS:  BP 128/82 mmHg  Pulse 68  Ht 5\' 6"  (1.676 m)  Wt 229 lb 6.4 oz (104.055 kg)  BMI 37.04 kg/m2 Well nourished, well developed, in no acute distress HEENT: normal Neck: no JVD Cardiac:  normal S1, S2; RRR; no murmur Lungs:  clear to auscultation bilaterally, no wheezing, rhonchi or rales Abd: soft, nontender, no hepatomegaly Ext: no edema Skin: warm and dry Neuro:  CNs 2-12 intact, no focal abnormalities noted  ASSESSMENT AND PLAN:  1. OSA on CPAP - her download today showed an AHI of 1.1/hr on 14cm H2O with 83% compliance in using more than 4 hours nightly.  SHe will continue on CPAP at 14cm H2O. 2. Obesity -  I have encouarged her to increase her aerobic exericse to 60 minutes to try to drop some weight 3. HTN - well controlled - continue metoprolol  Followup with me in 1 year     Signed, Catherine Him, MD [facility] 01/04/2014 10:55 AM

## 2014-01-04 NOTE — Patient Instructions (Signed)
Your physician wants you to follow-up in: 1 year with Dr. Turner. You will receive a reminder letter in the mail two months in advance. If you don't receive a letter, please call our office to schedule the follow-up appointment.  Your physician recommends that you continue on your current medications as directed. Please refer to the Current Medication list given to you today.  

## 2014-01-11 ENCOUNTER — Other Ambulatory Visit: Payer: Self-pay | Admitting: Interventional Cardiology

## 2014-01-11 ENCOUNTER — Other Ambulatory Visit: Payer: Self-pay | Admitting: *Deleted

## 2014-01-11 MED ORDER — FLECAINIDE ACETATE 150 MG PO TABS: 150.0000 mg | ORAL_TABLET | Freq: Two times a day (BID) | ORAL | Status: DC

## 2014-01-19 ENCOUNTER — Encounter: Payer: Self-pay | Admitting: Interventional Cardiology

## 2014-01-19 ENCOUNTER — Ambulatory Visit (INDEPENDENT_AMBULATORY_CARE_PROVIDER_SITE_OTHER): Payer: BC Managed Care – PPO | Admitting: Interventional Cardiology

## 2014-01-19 VITALS — BP 132/80 | HR 65 | Ht 67.0 in | Wt 236.8 lb

## 2014-01-19 DIAGNOSIS — Z9989 Dependence on other enabling machines and devices: Secondary | ICD-10-CM

## 2014-01-19 DIAGNOSIS — I10 Essential (primary) hypertension: Secondary | ICD-10-CM

## 2014-01-19 DIAGNOSIS — G4733 Obstructive sleep apnea (adult) (pediatric): Secondary | ICD-10-CM

## 2014-01-19 DIAGNOSIS — E669 Obesity, unspecified: Secondary | ICD-10-CM

## 2014-01-19 DIAGNOSIS — I480 Paroxysmal atrial fibrillation: Secondary | ICD-10-CM

## 2014-01-19 MED ORDER — METOPROLOL SUCCINATE ER 50 MG PO TB24: ORAL_TABLET | ORAL | Status: DC

## 2014-01-19 MED ORDER — FLECAINIDE ACETATE 150 MG PO TABS: 150.0000 mg | ORAL_TABLET | Freq: Two times a day (BID) | ORAL | Status: DC

## 2014-01-19 NOTE — Patient Instructions (Signed)
Your physician recommends that you continue on your current medications as directed. Please refer to the Current Medication list given to you today.  Your cardiac medications have been refilled today  Your physician wants you to follow-up in: 1 year You will receive a reminder letter in the mail two months in advance. If you don't receive a letter, please call our office to schedule the follow-up appointment.

## 2014-01-19 NOTE — Progress Notes (Signed)
Patient ID: Catherine Callahan, female   DOB: 10/07/1955, 58 y.o.   MRN: [id number]    [address] [place], [place]  [id number] Phone: [phone] Fax:  [phone]  Date:  01/19/2014   ID:  Catherine Callahan, DOB 10/07/1955, MRN [id number]  PCP:  Allean FoundSMITH,CANDACE THIELE, MD   ASSESSMENT:  1. Paroxysmal atrial fibrillation, currently in normal sinus rhythm 2. Obesity, moderate to severe 3. Obstructive sleep apnea, on CPAP  PLAN:  1. Continue current medical regimen. Refill current cardiovascular medications were 3 month supply. Can refill up to a year. 2. Compliance was CPAP 3. Advocated weight loss and aerobic exercise 4. I removed hypertension from the problem list as the patient has no documented history of such   SUBJECTIVE: Catherine Callahan is a 58 y.o. female with obesity, sleep apnea, and paroxysmal atrial fibrillation. She has seen both [facility]red [facility] and [facility] Lysbeth [facility]asris [facility]son [facility]. Ablation as possible. She is doing relatively well on medical therapy with 3 or 4 episodes of atrial fibrillation per every 6 weeks. She seems to continue with an adequate lifestyle despite episodes occurring at this level of frequency. She denies syncope. She does not have episodes that last for greater than 24 hours. When episodes occur, she uses metoprolol when necessary to slow the rate, she relaxes, and things tend to improve. She is able to carry on an exercise program several times per week.   Wt Readings from Last 3 Encounters:  01/19/14 236 lb 12.8 oz (107.412 kg)  01/04/14 229 lb 6.4 oz (104.055 kg)  04/15/13 231 lb (104.781 kg)     Past Medical History  Diagnosis Date  . Paroxysmal atrial fibrillation   . Arthritis   . Hypertension, essential, benign 01/11/2011    pt denies this  . OSA on CPAP 01/05/2013  . Encounter for long-term (current) use of other medications 01/11/2011  . Atrial flutter 01/05/2013  . Overweight   .  Ulcerative colitis     Current Outpatient Prescriptions  Medication Sig Dispense Refill  . aspirin 81 MG tablet Take 81 mg by mouth daily.    . balsalazide (COLAZAL) 750 MG capsule Take 1,500 mg by mouth daily. Alternate with ASACOL    . flecainide (TAMBOCOR) 150 MG tablet Take 1 tablet (150 mg total) by mouth 2 (two) times daily. 180 tablet 3  . Mesalamine (ASACOL HD) 800 MG TBEC Take 800 mg by mouth daily. Alternate with COLAZAL    . metoprolol succinate (TOPROL-XL) 50 MG 24 hr tablet TAKE 1 TABLET TWICE A DAY ORALLY 90 tablet 3  . Multiple Vitamin (MULTI-VITAMINS) TABS Take 1 capsule by mouth daily.     . rosuvastatin (CRESTOR) 40 MG tablet Take 40 mg by mouth daily.    Marland Kitchen. atorvastatin (LIPITOR) 80 MG tablet Take 80 mg by mouth daily. (Pt is to stop Crestor 40 mg daily on 01/29/14 and start Atorvastatin 80 mg daily)  3   No current facility-administered medications for this visit.    Allergies:    Allergies  Allergen Reactions  . Penicillins Anaphylaxis  . Meloxicam Hives  . Latex Rash    Social History:  The patient  reports that she has never smoked. She does not have any smokeless tobacco history on file. She reports that she does not drink alcohol or use illicit drugs.   ROS:  Please see the history of present illness.   No neurological complaints.  No episodes of syncope. Denies chest pain. Does have dyspnea when out of rhythm.   All other systems reviewed and negative.   OBJECTIVE: VS:  BP 132/80 mmHg  Pulse 65  Ht 5\' 7"  (1.702 m)  Wt 236 lb 12.8 oz (107.412 kg)  BMI 37.08 kg/m2 Well nourished, well developed, in no acute distress, obese HEENT: normal Neck: JVD flat. Carotid bruit absent  Cardiac:  normal S1, S2; RRR; no murmur Lungs:  clear to auscultation bilaterally, no wheezing, rhonchi or rales Abd: soft, nontender, no hepatomegaly Ext: Edema absent. Pulses 2+ Skin: warm and dry Neuro:  CNs 2-12 intact, no focal abnormalities noted  EKG:  Not performed        Signed, Darci NeedleHenry W. B. Emrys Mckamie III, MD 01/19/2014 5:09 PM

## 2014-01-24 ENCOUNTER — Telehealth: Payer: Self-pay | Admitting: Nurse Practitioner

## 2014-01-24 NOTE — Telephone Encounter (Signed)
At ~ 10:30 this AM, pt developed tachypalps and lightheadedness, which are her symptoms when she is in afib.  She had already taken her flecainide and toprol and took 2 extra 1/2 doses of toprol (25mg  each).  Her HR is now coming down and she is feeling much better.  She is not on anticoagulation and says that she usually has PAF about once/month.  I advised that if she is feeling poorly, which currently she is not, then she will need to come into the ED.  OTW, if she does not convert by the AM, she should call into the office for ECG and evaluation tomorrow morning as she may need to be set up for DCCV.  She verbalized understanding and will call back if she has any issues.  She believes, based on the natural history of her afib, that she is getting ready to convert on her own.

## 2014-07-06 ENCOUNTER — Other Ambulatory Visit: Payer: Self-pay | Admitting: *Deleted

## 2014-07-06 MED ORDER — METOPROLOL SUCCINATE ER 50 MG PO TB24: ORAL_TABLET | ORAL | Status: DC

## 2014-07-07 ENCOUNTER — Other Ambulatory Visit: Payer: Self-pay | Admitting: Interventional Cardiology

## 2014-10-13 ENCOUNTER — Encounter: Payer: Self-pay | Admitting: Cardiology

## 2014-11-19 ENCOUNTER — Other Ambulatory Visit: Payer: Self-pay | Admitting: *Deleted

## 2014-11-19 DIAGNOSIS — G4733 Obstructive sleep apnea (adult) (pediatric): Secondary | ICD-10-CM

## 2014-12-01 ENCOUNTER — Other Ambulatory Visit: Payer: Self-pay | Admitting: Interventional Cardiology

## 2015-01-13 ENCOUNTER — Ambulatory Visit: Payer: Self-pay | Admitting: Cardiology

## 2015-02-04 ENCOUNTER — Ambulatory Visit: Payer: Self-pay | Admitting: Interventional Cardiology

## 2015-02-04 ENCOUNTER — Ambulatory Visit (INDEPENDENT_AMBULATORY_CARE_PROVIDER_SITE_OTHER): Payer: BLUE CROSS/BLUE SHIELD | Admitting: Interventional Cardiology

## 2015-02-04 ENCOUNTER — Encounter: Payer: Self-pay | Admitting: Interventional Cardiology

## 2015-02-04 VITALS — BP 132/84 | HR 59 | Ht 65.0 in | Wt 241.0 lb

## 2015-02-04 DIAGNOSIS — Z9989 Dependence on other enabling machines and devices: Secondary | ICD-10-CM

## 2015-02-04 DIAGNOSIS — G4733 Obstructive sleep apnea (adult) (pediatric): Secondary | ICD-10-CM | POA: Diagnosis not present

## 2015-02-04 DIAGNOSIS — E669 Obesity, unspecified: Secondary | ICD-10-CM | POA: Diagnosis not present

## 2015-02-04 DIAGNOSIS — I480 Paroxysmal atrial fibrillation: Secondary | ICD-10-CM

## 2015-02-04 DIAGNOSIS — I10 Essential (primary) hypertension: Secondary | ICD-10-CM

## 2015-02-04 NOTE — Progress Notes (Signed)
Cardiology Office Note   Date:  02/04/2015   ID:  Catherine ClearDiane Y Zeoli, DOB May 09, 1955, MRN [id number]  PCP:  Allean FoundSMITH,CANDACE THIELE, MD  Cardiologist:  Lesleigh NoeSMITH III,Tanay Massiah W, MD   Chief Complaint  Patient presents with  . Atrial Fibrillation      History of Present Illness: Catherine Callahan is a 59 y.o. female who presents for paroxysmal atrial fibrillation, flecainide therapy, obesity, essential hypertension, and obstructive sleep apnea.  Anvi is recently returned from a trip to [place]. She had absolutely no rhythm disturbance despite visiting many pubs. She denies medication side effects. She does have mild exertional dyspnea and has gained weight. No episodes of syncope or other cardiac complaints.    Past Medical History  Diagnosis Date  . Paroxysmal atrial fibrillation (HCC)   . Arthritis   . Hypertension, essential, benign 01/11/2011    pt denies this  . OSA on CPAP 01/05/2013  . Encounter for long-term (current) use of other medications 01/11/2011  . Atrial flutter (HCC) 01/05/2013  . Overweight   . Ulcerative colitis [facility](HCC)     Past Surgical History  Procedure Laterality Date  . Colon surgery  2004    colon resection     Current Outpatient Prescriptions  Medication Sig Dispense Refill  . aspirin 81 MG tablet Take 81 mg by mouth daily.    Marland Kitchen. atorvastatin (LIPITOR) 80 MG tablet Take 80 mg by mouth daily. (Pt is to stop Crestor 40 mg daily on 01/29/14 and start Atorvastatin 80 mg daily)  3  . flecainide (TAMBOCOR) 150 MG tablet Take 1 tablet (150 mg total) by mouth 2 (two) times daily. 180 tablet 3  . Mesalamine (ASACOL HD) 800 MG TBEC Take 800 mg by mouth daily.     . metoprolol succinate (TOPROL-XL) 50 MG 24 hr tablet TAKE 1 TABLET TWICE A DAY ORALLY 180 tablet 1  . Multiple Vitamin (MULTI-VITAMINS) TABS Take 1 capsule by mouth daily.      No current facility-administered medications for this visit.    Allergies:   Penicillins; Meloxicam; and Latex    Social  History:  The patient  reports that she has never smoked. She does not have any smokeless tobacco history on file. She reports that she does not drink alcohol or use illicit drugs.   Family History:  The patient's family history includes Alzheimer's disease in her mother; Atrial fibrillation in her father; CAD (age of onset: 5055) in her brother; Healthy in her brother and sister; Heart disease in her brother and mother; Other in her brother; Stroke in her father.    ROS:  Please see the history of present illness.   Otherwise, review of systems are positive for occasional palpitations. But otherwise unremarkable..   All other systems are reviewed and negative.    PHYSICAL EXAM: VS:  BP 132/84 mmHg  Pulse 59  Ht 5\' 5"  (1.651 m)  Wt 241 lb (109.317 kg)  BMI 40.10 kg/m2 , BMI Body mass index is 40.1 kg/(m^2). GEN: Well nourished, well developed, in no acute distress HEENT: normal Neck: no JVD, carotid bruits, or masses Cardiac: RRR.  There is no murmur, rub, or gallop. There is no edema. Respiratory:  clear to auscultation bilaterally, normal work of breathing. GI: soft, nontender, nondistended, + BS MS: no deformity or atrophy Skin: warm and dry, no rash Neuro:  Strength and sensation are intact Psych: euthymic mood, full affect   EKG:  EKG is and reveals normal sinus rhythm/sinus bradycardia with  first AV block at 270 ms. Otherwise unremarkable. ordered today.    Recent Labs: No results found for requested labs within last 365 days.    Lipid Panel    Component Value Date/Time   CHOL * 04/10/2010 0340    204        ATP III CLASSIFICATION:  <200     mg/dL   Desirable  098-119  mg/dL   Borderline High  >=147    mg/dL   High          TRIG 829* 04/10/2010 0340   HDL 62 04/10/2010 0340   CHOLHDL 3.3 04/10/2010 0340   VLDL 77* 04/10/2010 0340   LDLCALC  04/10/2010 0340    65        Total Cholesterol/HDL:CHD Risk Coronary Heart Disease Risk Table                     Men    Women  1/2 Average Risk   3.4   3.3  Average Risk       5.0   4.4  2 X Average Risk   9.6   7.1  3 X Average Risk  23.4   11.0        Use the calculated Patient Ratio above and the CHD Risk Table to determine the patient's CHD Risk.        ATP III CLASSIFICATION (LDL):  <100     mg/dL   Optimal  562-130  mg/dL   Near or Above                    Optimal  130-159  mg/dL   Borderline  865-784  mg/dL   High  >696     mg/dL   Very High      Wt Readings from Last 3 Encounters:  02/04/15 241 lb (109.317 kg)  01/19/14 236 lb 12.8 oz (107.412 kg)  01/04/14 229 lb 6.4 oz (104.055 kg)      Other studies Reviewed: Additional studies/ records that were reviewed today include: None. The findings include electronic medical record reveals no activity.    ASSESSMENT AND PLAN:  1. Paroxysmal atrial fibrillation (HCC) Well controlled on flecainide - EKG 12-Lead  2. Hypertension, essential, benign Excellent control - EKG 12-Lead  3. OSA on CPAP Wearing C Pap without complications  4. Obesity (BMI 30-39.9) Worsening    Current medicines are reviewed at length with the patient today.  The patient has the following concerns regarding medicines: None.  The following changes/actions have been instituted:    I encouraged aerobic activity.  Call if clinical symptoms or prolonged episodes of angina  Labs/ tests ordered today include:  Orders Placed This Encounter  Procedures  . EKG 12-Lead     Disposition:   FU with HS in 1 year  Signed, Lesleigh Noe, MD  02/04/2015 12:24 PM    [facility] [address] [place], [place], [place]  [address] Phone: [phone]; Fax: [phone]

## 2015-02-04 NOTE — Patient Instructions (Signed)
Medication Instructions:  Your physician recommends that you continue on your current medications as directed. Please refer to the Current Medication list given to you today.   Labwork: None ordered  Testing/Procedures: None ordered  Follow-Up: Your physician wants you to follow-up in: 1 YEAR WITH DR. SMITH  You will receive a reminder letter in the mail two months in advance. If you don't receive a letter, please call our office to schedule the follow-up appointment.   Any Other Special Instructions Will Be Listed Below (If Applicable).    If you need a refill on your cardiac medications before your next appointment, please call your pharmacy.   

## 2015-02-14 ENCOUNTER — Other Ambulatory Visit: Payer: Self-pay | Admitting: Interventional Cardiology

## 2015-03-09 ENCOUNTER — Telehealth: Payer: Self-pay | Admitting: Cardiology

## 2015-03-09 NOTE — Telephone Encounter (Signed)
New message      I call to confirm Friday's appt with pt.  She want to know if [facility] sent her cpap results to Dr Mayford Knifeurner?

## 2015-03-09 NOTE — Telephone Encounter (Signed)
Patient is aware that I have a download through 12/22 that Dr. Mayford Knifeurner will have for her appointment on Friday

## 2015-03-11 ENCOUNTER — Ambulatory Visit (INDEPENDENT_AMBULATORY_CARE_PROVIDER_SITE_OTHER): Payer: BLUE CROSS/BLUE SHIELD | Admitting: Cardiology

## 2015-03-11 ENCOUNTER — Encounter: Payer: Self-pay | Admitting: Cardiology

## 2015-03-11 VITALS — BP 120/90 | HR 67 | Ht 65.0 in | Wt 242.8 lb

## 2015-03-11 DIAGNOSIS — E669 Obesity, unspecified: Secondary | ICD-10-CM

## 2015-03-11 DIAGNOSIS — I10 Essential (primary) hypertension: Secondary | ICD-10-CM | POA: Diagnosis not present

## 2015-03-11 DIAGNOSIS — G4733 Obstructive sleep apnea (adult) (pediatric): Secondary | ICD-10-CM

## 2015-03-11 DIAGNOSIS — Z9989 Dependence on other enabling machines and devices: Secondary | ICD-10-CM

## 2015-03-11 NOTE — Progress Notes (Signed)
Cardiology Office Note   Date:  03/11/2015   ID:  DEMETRISS KO, DOB [birth year], MRN [id number]  PCP:  Reginia Naas, MD    Chief Complaint  Patient presents with  . Sleep Apnea      History of Present Illness: Catherine Callahan is a 60 y.o. female with a history of OSA, HTN and obesity who presents today for followup. She is doing well. She tolerates her CPAP therapy without any problems. She still feels rested in the am. She has no daytime sleepiness. She tolerates her full face mask and feels the pressure is adequate. She walks on the treadmill 3-5 times weekly for 30 minutes. She no longer has any problems with dry nose or nasal congestion.  She sometimes has a dry mouth.      Past Medical History  Diagnosis Date  . Paroxysmal atrial fibrillation (HCC)   . Arthritis   . Hypertension, essential, benign 01/11/2011    pt denies this  . OSA on CPAP 01/05/2013  . Encounter for long-term (current) use of other medications 01/11/2011  . Atrial flutter ([place]) 01/05/2013  . Overweight   . Ulcerative colitis ([facility])   . Benign essential HTN 03/11/2015    Past Surgical History  Procedure Laterality Date  . Colon surgery  2004    colon resection     Current Outpatient Prescriptions  Medication Sig Dispense Refill  . aspirin 81 MG tablet Take 81 mg by mouth daily.    Marland Kitchen atorvastatin (LIPITOR) 80 MG tablet Take 80 mg by mouth daily. (Pt is to stop Crestor 40 mg daily on 01/29/14 and start Atorvastatin 80 mg daily)  3  . flecainide (TAMBOCOR) 150 MG tablet TAKE 1 TABLET (150 MG TOTAL) BY MOUTH 2 (TWO) TIMES DAILY. 180 tablet 3  . Mesalamine (ASACOL HD) 800 MG TBEC Take 800 mg by mouth daily.     . metoprolol succinate (TOPROL-XL) 50 MG 24 hr tablet TAKE 1 TABLET TWICE A DAY ORALLY 180 tablet 1  . Multiple Vitamin (MULTI-VITAMINS) TABS Take 1 capsule by mouth daily.      No current facility-administered medications for this visit.    Allergies:    Penicillins; Meloxicam; and Latex    Social History:  The patient  reports that she has never smoked. She does not have any smokeless tobacco history on file. She reports that she does not drink alcohol or use illicit drugs.   Family History:  The patient's family history includes Alzheimer's disease in her mother; Atrial fibrillation in her father; CAD (age of onset: 2) in her brother; Healthy in her brother and sister; Heart disease in her brother and mother; Other in her brother; Stroke in her father.    ROS:  Please see the history of present illness.   Otherwise, review of systems are positive for none.   All other systems are reviewed and negative.    PHYSICAL EXAM: VS:  BP 120/90 mmHg  Pulse 67  Ht 5\' 5"  (1.651 m)  Wt 242 lb 12.8 oz (110.133 kg)  BMI 40.40 kg/m2  SpO2 95% , BMI Body mass index is 40.4 kg/(m^2). GEN: Well nourished, well developed, in no acute distress HEENT: normal Neck: no JVD, carotid bruits, or masses Cardiac: RRR; no murmurs, rubs, or gallops.  Trace edema  Respiratory:  clear to auscultation bilaterally, normal work of breathing GI: soft, nontender, nondistended, + BS MS:  no deformity or atrophy Skin: warm and dry, no rash Neuro:  Strength and sensation are intact Psych: euthymic mood, full affect   EKG:  EKG is not ordered today.    Recent Labs: No results found for requested labs within last 365 days.    Lipid Panel    Component Value Date/Time   CHOL * 04/10/2010 0340    204        ATP III CLASSIFICATION:  <200     mg/dL   Desirable  200-239  mg/dL   Borderline High  >=240    mg/dL   High          TRIG 385* 04/10/2010 0340   HDL 62 04/10/2010 0340   CHOLHDL 3.3 04/10/2010 0340   VLDL 77* 04/10/2010 0340   LDLCALC  04/10/2010 0340    65        Total Cholesterol/HDL:CHD Risk Coronary Heart Disease Risk Table                     Men   Women  1/2 Average Risk   3.4   3.3  Average Risk       5.0   4.4  2 X Average Risk   9.6    7.1  3 X Average Risk  23.4   11.0        Use the calculated Patient Ratio above and the CHD Risk Table to determine the patient's CHD Risk.        ATP III CLASSIFICATION (LDL):  <100     mg/dL   Optimal  100-129  mg/dL   Near or Above                    Optimal  130-159  mg/dL   Borderline  160-189  mg/dL   High  >190     mg/dL   Very High      Wt Readings from Last 3 Encounters:  03/11/15 242 lb 12.8 oz (110.133 kg)  02/04/15 241 lb (109.317 kg)  01/19/14 236 lb 12.8 oz (107.412 kg)    ASSESSMENT AND PLAN:  1. OSA on CPAP - her download today showed an AHI of 0.9/hr on 14cm H2O with 77% compliance in using more than 4 hours nightly. She will continue on CPAP at 14cm H2O. 2. Obesity - I encouraged her to continue her exercise program 3. HTN - borderline controlled - continue metoprolol    Current medicines are reviewed at length with the patient today.  The patient does not have concerns regarding medicines.  The following changes have been made:  no change  Labs/ tests ordered today: See above Assessment and Plan No orders of the defined types were placed in this encounter.     Disposition:   FU with me in 1 year  Signed, Sueanne Margarita, MD  03/11/2015 11:28 AM    [facility] [place], [place], [place]  [address] Phone: [phone]; Fax: [phone]

## 2015-03-11 NOTE — Patient Instructions (Signed)

## 2015-03-17 ENCOUNTER — Encounter: Payer: Self-pay | Admitting: Cardiology

## 2015-05-29 ENCOUNTER — Other Ambulatory Visit: Payer: Self-pay | Admitting: Interventional Cardiology

## 2015-06-16 DIAGNOSIS — G4733 Obstructive sleep apnea (adult) (pediatric): Secondary | ICD-10-CM | POA: Diagnosis not present

## 2015-07-05 DIAGNOSIS — M25561 Pain in right knee: Secondary | ICD-10-CM | POA: Diagnosis not present

## 2015-09-20 DIAGNOSIS — Z79899 Other long term (current) drug therapy: Secondary | ICD-10-CM | POA: Diagnosis not present

## 2015-09-20 DIAGNOSIS — K5190 Ulcerative colitis, unspecified, without complications: Secondary | ICD-10-CM | POA: Diagnosis not present

## 2015-10-07 DIAGNOSIS — G4733 Obstructive sleep apnea (adult) (pediatric): Secondary | ICD-10-CM | POA: Diagnosis not present

## 2015-11-09 DIAGNOSIS — H43812 Vitreous degeneration, left eye: Secondary | ICD-10-CM | POA: Diagnosis not present

## 2015-11-30 DIAGNOSIS — H2512 Age-related nuclear cataract, left eye: Secondary | ICD-10-CM | POA: Diagnosis not present

## 2016-01-30 ENCOUNTER — Encounter: Payer: Self-pay | Admitting: Interventional Cardiology

## 2016-01-31 DIAGNOSIS — G4733 Obstructive sleep apnea (adult) (pediatric): Secondary | ICD-10-CM | POA: Diagnosis not present

## 2016-02-06 ENCOUNTER — Ambulatory Visit (INDEPENDENT_AMBULATORY_CARE_PROVIDER_SITE_OTHER): Payer: BLUE CROSS/BLUE SHIELD | Admitting: Interventional Cardiology

## 2016-02-06 ENCOUNTER — Encounter (INDEPENDENT_AMBULATORY_CARE_PROVIDER_SITE_OTHER): Payer: Self-pay

## 2016-02-06 VITALS — BP 122/74 | HR 63 | Ht 65.0 in | Wt 248.0 lb

## 2016-02-06 DIAGNOSIS — I484 Atypical atrial flutter: Secondary | ICD-10-CM | POA: Diagnosis not present

## 2016-02-06 DIAGNOSIS — I10 Essential (primary) hypertension: Secondary | ICD-10-CM

## 2016-02-06 DIAGNOSIS — I480 Paroxysmal atrial fibrillation: Secondary | ICD-10-CM

## 2016-02-06 DIAGNOSIS — Z23 Encounter for immunization: Secondary | ICD-10-CM | POA: Diagnosis not present

## 2016-02-06 DIAGNOSIS — Z79899 Other long term (current) drug therapy: Secondary | ICD-10-CM

## 2016-02-06 DIAGNOSIS — I440 Atrioventricular block, first degree: Secondary | ICD-10-CM | POA: Insufficient documentation

## 2016-02-06 DIAGNOSIS — Z5181 Encounter for therapeutic drug level monitoring: Secondary | ICD-10-CM

## 2016-02-06 LAB — BASIC METABOLIC PANEL
BUN: 12 mg/dL (ref 7–25)
CO2: 30 mmol/L (ref 20–31)
Calcium: 9.5 mg/dL (ref 8.6–10.4)
Chloride: 104 mmol/L (ref 98–110)
Creat: 0.87 mg/dL (ref 0.50–0.99)
Glucose, Bld: 93 mg/dL (ref 65–99)
Potassium: 4.5 mmol/L (ref 3.5–5.3)
Sodium: 140 mmol/L (ref 135–146)

## 2016-02-06 LAB — MAGNESIUM: Magnesium: 1.6 mg/dL (ref 1.5–2.5)

## 2016-02-06 NOTE — Addendum Note (Signed)
Addended by: Julio SicksBOWERS, Deann Mclaine L on: 02/06/2016 11:23 AM   Modules accepted: Orders

## 2016-02-06 NOTE — Progress Notes (Signed)
Cardiology Office Note    Date:  02/06/2016   ID:  Catherine Callahan, DOB 02-21-1956, MRN [id number]  PCP:  Allean FoundSMITH,CANDACE THIELE, MD  Cardiologist: Lesleigh NoeHenry W Smith III, MD   Chief Complaint  Patient presents with  . Atrial Fibrillation    History of Present Illness:  Catherine Callahan is a 60 y.o. female who presents for paroxysmal atrial fibrillation, flecainide therapy, obesity, essential hypertension, and obstructive sleep apnea.  2 episodes of brief atrial fibrillation over the past 12 months. One episode occurred when she forgot to take her morning dose of Flecainide. Another episode occurred when she was on a trip and was not using C Pap and also had a concurrent upper respiratory infection. Neither episode lasted greater than an hour.  Activity, exertional tolerance, and breathing all stable.    Past Medical History:  Diagnosis Date  . Arthritis   . Atrial flutter (HCC) 01/05/2013  . Benign essential HTN 03/11/2015  . Encounter for long-term (current) use of other medications 01/11/2011  . Hypertension, essential, benign 01/11/2011   pt denies this  . OSA on CPAP 01/05/2013  . Overweight   . Paroxysmal atrial fibrillation (HCC)   . Ulcerative colitis [facility](HCC)     Past Surgical History:  Procedure Laterality Date  . COLON SURGERY  2004   colon resection    Current Medications: Outpatient Medications Prior to Visit  Medication Sig Dispense Refill  . aspirin 81 MG tablet Take 81 mg by mouth daily.    Marland Kitchen. atorvastatin (LIPITOR) 80 MG tablet Take 80 mg by mouth daily. (Pt is to stop Crestor 40 mg daily on 01/29/14 and start Atorvastatin 80 mg daily)  3  . flecainide (TAMBOCOR) 150 MG tablet TAKE 1 TABLET (150 MG TOTAL) BY MOUTH 2 (TWO) TIMES DAILY. 180 tablet 3  . Mesalamine (ASACOL HD) 800 MG TBEC Take 800 mg by mouth daily.     . metoprolol succinate (TOPROL-XL) 50 MG 24 hr tablet Take 1 tablet (50 mg total) by mouth 2 (two) times daily. 180 tablet 2  . Multiple Vitamin  (MULTI-VITAMINS) TABS Take 1 capsule by mouth daily.      No facility-administered medications prior to visit.      Allergies:   Penicillins; Meloxicam; and Latex   Social History   Social History  . Marital status: Widowed    Spouse name: N/A  . Number of children: N/A  . Years of education: N/A   Social History Main Topics  . Smoking status: Never Smoker  . Smokeless tobacco: Never Used  . Alcohol use No  . Drug use: No  . Sexual activity: Not on file   Other Topics Concern  . Not on file   Social History Narrative   [place] lives in [place] alone.  Widowed.  Works as a Teacher, adult educationmassage therapist.  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     Family History:  The patient's family history includes Alzheimer's disease in her mother; Atrial fibrillation in her father; CAD (age of onset: 8555) in her brother; Healthy in her brother and sister; Heart disease in her brother and mother; Other in her brother; Stroke in her father.   ROS:   Please see the history of present illness.    Upper respiratory congestion, cough, recent chills, shortness of breath with activity related to the upper respiratory illness.  All other systems reviewed and are negative.   PHYSICAL EXAM:   VS:  BP 122/74   Pulse 63   Ht 5\' 5"  (1.651 m)   Wt 248 lb (112.5 kg)   BMI 41.27 kg/m    GEN: Well nourished, well developed, in no acute distress  HEENT: normal  Neck: no JVD, carotid bruits, or masses Cardiac: RRR; no murmurs, rubs, or gallops,no edema  Respiratory:  clear to auscultation bilaterally, normal work of breathing GI: soft, nontender, nondistended, + BS MS: no deformity or atrophy  Skin: warm and dry, no rash Neuro:  Alert and Oriented x 3, Strength and sensation are intact Psych: euthymic mood, full affect  Wt Readings from Last 3 Encounters:  02/06/16 248 lb (112.5 kg)  03/11/15 242 lb 12.8 oz (110.1 kg)  02/04/15 241 lb (109.3 kg)       Studies/Labs Reviewed:   EKG:  EKG  Normal sinus rhythm with first-degree AV block. No change compared to prior tracings.  Recent Labs: No results found for requested labs within last 8760 hours.   Lipid Panel    Component Value Date/Time   CHOL (H) 04/10/2010 0340    204        ATP III CLASSIFICATION:  <200     mg/dL   Desirable  161-096  mg/dL   Borderline High  >=045    mg/dL   High          TRIG 409 (H) 04/10/2010 0340   HDL 62 04/10/2010 0340   CHOLHDL 3.3 04/10/2010 0340   VLDL 77 (H) 04/10/2010 0340   LDLCALC  04/10/2010 0340    65        Total Cholesterol/HDL:CHD Risk Coronary Heart Disease Risk Table                     Men   Women  1/2 Average Risk   3.4   3.3  Average Risk       5.0   4.4  2 X Average Risk   9.6   7.1  3 X Average Risk  23.4   11.0        Use the calculated Patient Ratio above and the CHD Risk Table to determine the patient's CHD Risk.        ATP III CLASSIFICATION (LDL):  <100     mg/dL   Optimal  811-914  mg/dL   Near or Above                    Optimal  130-159  mg/dL   Borderline  782-956  mg/dL   High  >213     mg/dL   Very High    Additional studies/ records that were reviewed today include:  No recent laboratory data    ASSESSMENT:    1. Paroxysmal atrial fibrillation (HCC)   2. Atypical atrial flutter (HCC)   3. Benign essential HTN   4. Encounter for monitoring flecainide therapy   5. First degree AV block      PLAN:  In order of problems listed above:  1. Rare episodes, with overall disease process well controlled with flecainide therapy and concomitant beta blocker. 2. Same 3. Excellent control. We discussed activity/exercise, weight loss, and low salt diet. 4. No complications. We will get a magnesium level and potassium level today. Plan clinical follow-up in one year. 5. First-degree AV block secondary to medical therapy. Improves this year compared to last.    Medication Adjustments/Labs and Tests  Ordered: Current medicines are reviewed at length with  the patient today.  Concerns regarding medicines are outlined above.  Medication changes, Labs and Tests ordered today are listed in the Patient Instructions below. Patient Instructions  Medication Instructions:  None  Labwork: BMET and Magnesium today  Testing/Procedures: None  Follow-Up: Your physician wants you to follow-up in: 1 year with Dr. Katrinka BlazingSmith.  You will receive a reminder letter in the mail two months in advance. If you don't receive a letter, please call our office to schedule the follow-up appointment.   Any Other Special Instructions Will Be Listed Below (If Applicable).     If you need a refill on your cardiac medications before your next appointment, please call your pharmacy.      Signed, Lesleigh NoeHenry W Smith III, MD  02/06/2016 8:28 AM    [facility] [address], [place], [place]  [address] Phone: [phone](336) [phone]; Fax: [phone]) [phone]

## 2016-02-06 NOTE — Patient Instructions (Signed)
Medication Instructions:  None  Labwork: BMET and Magnesium today.  Testing/Procedures: None  Follow-Up: Your physician wants you to follow-up in: 1 year with Dr. Smith.  You will receive a reminder letter in the mail two months in advance. If you don't receive a letter, please call our office to schedule the follow-up appointment.   Any Other Special Instructions Will Be Listed Below (If Applicable).     If you need a refill on your cardiac medications before your next appointment, please call your pharmacy.   

## 2016-02-07 ENCOUNTER — Telehealth: Payer: Self-pay | Admitting: Interventional Cardiology

## 2016-02-07 NOTE — Telephone Encounter (Signed)
New message ° ° ° °Pt verbalized that she is returning call for rn  °

## 2016-02-07 NOTE — Telephone Encounter (Signed)
Spoke with pt and informed her of lab results. Pt verbalized understanding. 

## 2016-02-09 ENCOUNTER — Other Ambulatory Visit: Payer: Self-pay | Admitting: Interventional Cardiology

## 2016-02-21 ENCOUNTER — Encounter: Payer: Self-pay | Admitting: Interventional Cardiology

## 2016-02-21 ENCOUNTER — Other Ambulatory Visit (HOSPITAL_COMMUNITY)
Admission: RE | Admit: 2016-02-21 | Discharge: 2016-02-21 | Disposition: A | Discharge status: Home / Self Care | Payer: BLUE CROSS/BLUE SHIELD | Source: Ambulatory Visit | Attending: Family Medicine | Admitting: Family Medicine

## 2016-02-21 ENCOUNTER — Other Ambulatory Visit: Payer: Self-pay | Admitting: Family Medicine

## 2016-02-21 DIAGNOSIS — Z0000 Encounter for general adult medical examination without abnormal findings: Secondary | ICD-10-CM | POA: Diagnosis not present

## 2016-02-21 DIAGNOSIS — Z01419 Encounter for gynecological examination (general) (routine) without abnormal findings: Secondary | ICD-10-CM | POA: Insufficient documentation

## 2016-02-21 DIAGNOSIS — E782 Mixed hyperlipidemia: Secondary | ICD-10-CM | POA: Diagnosis not present

## 2016-02-21 DIAGNOSIS — Z124 Encounter for screening for malignant neoplasm of cervix: Secondary | ICD-10-CM | POA: Diagnosis not present

## 2016-02-21 DIAGNOSIS — I480 Paroxysmal atrial fibrillation: Secondary | ICD-10-CM | POA: Diagnosis not present

## 2016-02-23 ENCOUNTER — Telehealth: Payer: Self-pay | Admitting: Cardiology

## 2016-02-23 ENCOUNTER — Other Ambulatory Visit: Payer: Self-pay | Admitting: Interventional Cardiology

## 2016-02-23 LAB — CYTOLOGY - PAP
Adequacy: ABSENT
Diagnosis: NEGATIVE

## 2016-02-23 NOTE — Telephone Encounter (Signed)
Informed pt of lab results. Pt verbalized understanding. 

## 2016-02-23 NOTE — Telephone Encounter (Signed)
Pt is returning Jennifer call for test results  °

## 2016-02-23 NOTE — Telephone Encounter (Signed)
Mrs.Stepheson is your call . Thanks

## 2016-02-23 NOTE — Telephone Encounter (Signed)
Left message to call back  

## 2016-03-19 ENCOUNTER — Encounter: Payer: Self-pay | Admitting: Cardiology

## 2016-03-19 NOTE — Progress Notes (Signed)
Cardiology Office Note    Date:  03/20/2016   ID:  Catherine Callahan, DOB [birth year], MRN [id number]  PCP:  Reginia Naas, MD  Cardiologist:  Daneen Schick, MD   Chief Complaint  Patient presents with  . Sleep Apnea  . Hypertension    History of Present Illness:  Catherine Callahan is a 61 y.o. female with a history of OSA, HTN and obesity who presents today for followup. She is doing well. She tolerates her CPAP therapy without any problems. She still feels rested in the am. She has some daytime sleepiness if she is not active.  She goes to bed at 9-10pm and gets up at 5am. She tolerates her full face mask and feels the pressure is adequate. She walks on the treadmill 3-5 times weekly for 30 minutes. She no longer significant problems with dry nose or nasal congestion.     Past Medical History:  Diagnosis Date  . Arthritis   . Atrial flutter ([place]) 01/05/2013  . Benign essential HTN 03/11/2015  . Encounter for long-term (current) use of other medications 01/11/2011  . Hypertension, essential, benign 01/11/2011   pt denies this  . OSA on CPAP 01/05/2013  . Overweight   . Paroxysmal atrial fibrillation (HCC)   . Ulcerative colitis [facility])     Past Surgical History:  Procedure Laterality Date  . COLON SURGERY  2004   colon resection    Current Medications: Outpatient Medications Prior to Visit  Medication Sig Dispense Refill  . aspirin 81 MG tablet Take 81 mg by mouth daily.    Marland Kitchen atorvastatin (LIPITOR) 80 MG tablet Take 80 mg by mouth daily. (Pt is to stop Crestor 40 mg daily on 01/29/14 and start Atorvastatin 80 mg daily)  3  . flecainide (TAMBOCOR) 150 MG tablet TAKE 1 TABLET (150 MG TOTAL) BY MOUTH 2 (TWO) TIMES DAILY. 180 tablet 3  . Mesalamine (ASACOL HD) 800 MG TBEC Take 800 mg by mouth daily.     . metoprolol succinate (TOPROL-XL) 50 MG 24 hr tablet TAKE 1 TABLET (50 MG TOTAL) BY MOUTH 2 (TWO) TIMES DAILY. 180 tablet 3  . Multiple Vitamin (MULTI-VITAMINS)  TABS Take 1 capsule by mouth daily.      No facility-administered medications prior to visit.      Allergies:   Penicillin g; Penicillins; Meloxicam; and Latex   Social History   Social History  . Marital status: Widowed    Spouse name: N/A  . Number of children: N/A  . Years of education: N/A   Social History Main Topics  . Smoking status: Never Smoker  . Smokeless tobacco: Never Used  . Alcohol use No  . Drug use: No  . Sexual activity: Not on file   Other Topics Concern  . Not on file   Social History Narrative   Pt lives in [place] alone.  Widowed.  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     Family History:  The patient's family history includes Alzheimer's disease in her mother; Atrial fibrillation in her father; CAD (age of onset: 17) in her brother; Healthy in her brother and sister; Heart disease in her brother and mother; Other in her brother; Stroke in her father.   ROS:   Please see the history of present illness.    ROS All other systems reviewed and are negative.  No flowsheet data found.  PHYSICAL EXAM:   VS:  BP 124/66   Pulse 72   Ht 5\' 5"  (1.651 m)   Wt 251 lb (113.9 kg)   BMI 41.77 kg/m    GEN: Well nourished, well developed, in no acute distress  HEENT: normal  Neck: no JVD, carotid bruits, or masses Cardiac: RRR; no murmurs, rubs, or gallops,no edema.  Intact distal pulses bilaterally.  Respiratory:  clear to auscultation bilaterally, normal work of breathing GI: soft, nontender, nondistended, + BS MS: no deformity or atrophy  Skin: warm and dry, no rash Neuro:  Alert and Oriented x 3, Strength and sensation are intact Psych: euthymic mood, full affect  Wt Readings from Last 3 Encounters:  03/20/16 251 lb (113.9 kg)  02/06/16 248 lb (112.5 kg)  03/11/15 242 lb 12.8 oz (110.1 kg)      Studies/Labs Reviewed:   EKG:  EKG is not ordered today.    Recent  Labs: 02/06/2016: BUN 12; Creat 0.87; Magnesium 1.6; Potassium 4.5; Sodium 140   Lipid Panel    Component Value Date/Time   CHOL (H) 04/10/2010 0340    204        ATP III CLASSIFICATION:  <200     mg/dL   Desirable  200-239  mg/dL   Borderline High  >=240    mg/dL   High          TRIG 385 (H) 04/10/2010 0340   HDL 62 04/10/2010 0340   CHOLHDL 3.3 04/10/2010 0340   VLDL 77 (H) 04/10/2010 0340   LDLCALC  04/10/2010 0340    65        Total Cholesterol/HDL:CHD Risk Coronary Heart Disease Risk Table                     Men   Women  1/2 Average Risk   3.4   3.3  Average Risk       5.0   4.4  2 X Average Risk   9.6   7.1  3 X Average Risk  23.4   11.0        Use the calculated Patient Ratio above and the CHD Risk Table to determine the patient's CHD Risk.        ATP III CLASSIFICATION (LDL):  <100     mg/dL   Optimal  100-129  mg/dL   Near or Above                    Optimal  130-159  mg/dL   Borderline  160-189  mg/dL   High  >190     mg/dL   Very High    Additional studies/ records that were reviewed today include:  CPAP download    ASSESSMENT:    1. OSA on CPAP   2. Benign essential HTN   3. Obesity (BMI 30-39.9)      PLAN:  In order of problems listed above:  OSA - the patient is tolerating PAP therapy well without any problems. The PAP download was reviewed today and showed an AHI of 1.5/hr on 14 cm H2O with 72% compliance in using more than 4 hours nightly.  The patient has been using and benefiting from CPAP use and will continue to benefit from therapy.  HTN - BP controlled on current meds  She will continue on BB. Obesity - I have encouraged her to continue in her routine exercise program and watch her carbs and portions.  Medication Adjustments/Labs and Tests Ordered: Current medicines are reviewed at length with the patient today.  Concerns regarding medicines are outlined above.  Medication changes, Labs and Tests ordered today are listed in the  Patient Instructions below.  There are no Patient Instructions on file for this visit.   Signed, Fransico Him, MD  03/20/2016 8:25 AM    [facility] [place], [place], [place]  [address] Phone: [phone]; Fax: [phone]

## 2016-03-20 ENCOUNTER — Ambulatory Visit (INDEPENDENT_AMBULATORY_CARE_PROVIDER_SITE_OTHER): Payer: BLUE CROSS/BLUE SHIELD | Admitting: Cardiology

## 2016-03-20 ENCOUNTER — Encounter (INDEPENDENT_AMBULATORY_CARE_PROVIDER_SITE_OTHER): Payer: Self-pay

## 2016-03-20 VITALS — BP 124/66 | HR 72 | Ht 65.0 in | Wt 251.0 lb

## 2016-03-20 DIAGNOSIS — E669 Obesity, unspecified: Secondary | ICD-10-CM | POA: Diagnosis not present

## 2016-03-20 DIAGNOSIS — G4733 Obstructive sleep apnea (adult) (pediatric): Secondary | ICD-10-CM

## 2016-03-20 DIAGNOSIS — I10 Essential (primary) hypertension: Secondary | ICD-10-CM | POA: Diagnosis not present

## 2016-03-20 DIAGNOSIS — Z9989 Dependence on other enabling machines and devices: Secondary | ICD-10-CM

## 2016-03-20 NOTE — Patient Instructions (Signed)

## 2016-04-12 DIAGNOSIS — Z961 Presence of intraocular lens: Secondary | ICD-10-CM | POA: Diagnosis not present

## 2016-05-30 ENCOUNTER — Telehealth: Payer: Self-pay | Admitting: Cardiology

## 2016-05-30 NOTE — Telephone Encounter (Signed)
Called the patient and she is coming by the office Thursday morning 05/31/16 at 9:00 am to bring her chip for a download.

## 2016-05-30 NOTE — Telephone Encounter (Signed)
New Message  Pt call requesting to speak with RN about getting her chip for her cpap machine read. Pt stats it needs to be read within 30 days and the results will need to be sent to [facility]. Please call back to discuss

## 2016-06-05 DIAGNOSIS — G4733 Obstructive sleep apnea (adult) (pediatric): Secondary | ICD-10-CM | POA: Diagnosis not present

## 2016-07-24 DIAGNOSIS — K219 Gastro-esophageal reflux disease without esophagitis: Secondary | ICD-10-CM | POA: Diagnosis not present

## 2016-07-24 DIAGNOSIS — Z1211 Encounter for screening for malignant neoplasm of colon: Secondary | ICD-10-CM | POA: Diagnosis not present

## 2016-08-16 DIAGNOSIS — R609 Edema, unspecified: Secondary | ICD-10-CM | POA: Diagnosis not present

## 2016-08-20 ENCOUNTER — Other Ambulatory Visit: Payer: Self-pay | Admitting: Family Medicine

## 2016-08-20 DIAGNOSIS — Z1231 Encounter for screening mammogram for malignant neoplasm of breast: Secondary | ICD-10-CM

## 2016-08-21 ENCOUNTER — Ambulatory Visit
Admission: RE | Admit: 2016-08-21 | Discharge: 2016-08-21 | Disposition: A | Discharge status: Home / Self Care | Payer: BLUE CROSS/BLUE SHIELD | Source: Ambulatory Visit | Attending: Family Medicine | Admitting: Family Medicine

## 2016-08-21 DIAGNOSIS — Z1231 Encounter for screening mammogram for malignant neoplasm of breast: Secondary | ICD-10-CM

## 2016-08-23 DIAGNOSIS — K635 Polyp of colon: Secondary | ICD-10-CM | POA: Diagnosis not present

## 2016-08-23 DIAGNOSIS — K5190 Ulcerative colitis, unspecified, without complications: Secondary | ICD-10-CM | POA: Diagnosis not present

## 2016-08-27 DIAGNOSIS — K635 Polyp of colon: Secondary | ICD-10-CM | POA: Diagnosis not present

## 2016-09-11 DIAGNOSIS — K5190 Ulcerative colitis, unspecified, without complications: Secondary | ICD-10-CM | POA: Diagnosis not present

## 2016-11-27 ENCOUNTER — Other Ambulatory Visit: Payer: Self-pay | Admitting: Interventional Cardiology

## 2016-12-06 DIAGNOSIS — J209 Acute bronchitis, unspecified: Secondary | ICD-10-CM | POA: Diagnosis not present

## 2016-12-16 DIAGNOSIS — J209 Acute bronchitis, unspecified: Secondary | ICD-10-CM | POA: Diagnosis not present

## 2016-12-20 DIAGNOSIS — J209 Acute bronchitis, unspecified: Secondary | ICD-10-CM | POA: Diagnosis not present

## 2017-02-25 ENCOUNTER — Other Ambulatory Visit: Payer: Self-pay | Admitting: Interventional Cardiology

## 2017-03-13 DIAGNOSIS — E782 Mixed hyperlipidemia: Secondary | ICD-10-CM | POA: Diagnosis not present

## 2017-03-13 DIAGNOSIS — Z0000 Encounter for general adult medical examination without abnormal findings: Secondary | ICD-10-CM | POA: Diagnosis not present

## 2017-03-14 ENCOUNTER — Other Ambulatory Visit: Payer: Self-pay | Admitting: *Deleted

## 2017-04-07 NOTE — Progress Notes (Signed)
Cardiology Office Note    Date:  04/08/2017   ID:  Catherine ClearDiane Y Gayler, DOB [birth year], MRN [id number]  PCP:  Merri BrunetteSmith, Candace, MD  Cardiologist: Lesleigh NoeHenry W Smith III, MD   Chief Complaint  Patient presents with  . Atrial Fibrillation    History of Present Illness:  Catherine Callahan is a 62 y.o. female who presents for paroxysmal atrial fibrillation, flecainide therapy, obesity, essential hypertension, and obstructive sleep apnea.   Several brief episodes over the past year and particularly during an episode of bronchitis that occurred last summer.  No prolonged episodes.  She can feel recurrences of atrial fibrillation when they can occur.  No medication side effects related to flecainide.  No episodes of syncope, chest pain,  Past Medical History:  Diagnosis Date  . Arthritis   . Atrial flutter (HCC) 01/05/2013  . Benign essential HTN 03/11/2015  . Encounter for long-term (current) use of other medications 01/11/2011  . Hypertension, essential, benign 01/11/2011   pt denies this  . OSA on CPAP 01/05/2013  . Overweight   . Paroxysmal atrial fibrillation (HCC)   . Ulcerative colitis [facility](HCC)     Past Surgical History:  Procedure Laterality Date  . COLON SURGERY  2004   colon resection    Current Medications: Outpatient Medications Prior to Visit  Medication Sig Dispense Refill  . aspirin 81 MG tablet Take 81 mg by mouth daily.    Marland Kitchen. atorvastatin (LIPITOR) 80 MG tablet Take 80 mg by mouth daily. (Pt is to stop Crestor 40 mg daily on 01/29/14 and start Atorvastatin 80 mg daily)  3  . flecainide (TAMBOCOR) 150 MG tablet TAKE 1 TABLET BY MOUTH TWICE A DAY 180 tablet 0  . Mesalamine (ASACOL HD) 800 MG TBEC Take 800 mg by mouth daily.     . metoprolol succinate (TOPROL-XL) 50 MG 24 hr tablet TAKE 1 TABLET (50 MG TOTAL) BY MOUTH 2 (TWO) TIMES DAILY. 180 tablet 0  . Multiple Vitamin (MULTI-VITAMINS) TABS Take 1 capsule by mouth daily.      No facility-administered medications prior to  visit.      Allergies:   Penicillin g; Penicillins; Meloxicam; and Latex   Social History   Socioeconomic History  . Marital status: Widowed    Spouse name: None  . Number of children: None  . Years of education: None  . Highest education level: None  Social Needs  . Financial resource strain: None  . Food insecurity - worry: None  . Food insecurity - inability: None  . Transportation needs - medical: None  . Transportation needs - non-medical: None  Occupational History  . None  Tobacco Use  . Smoking status: Never Smoker  . Smokeless tobacco: Never Used  Substance and Sexual Activity  . Alcohol use: No  . Drug use: No  . Sexual activity: None  Other Topics Concern  . None  Social History Narrative   [place].  Widowed.  Works as a Teacher, adult educationmassage therapist.  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     Family History:  The patient's family history includes Alzheimer's disease in her mother; Atrial fibrillation in her father; CAD (age of onset: 6755) in her brother; Healthy in her brother and sister; Heart disease in her brother and mother; Other in her brother; Stroke in her father.   ROS:   Please see the history of present illness.    Elevated cholesterol possibly  related to mesalamine. All other systems reviewed and are negative.   PHYSICAL EXAM:   VS:  BP 112/86   Pulse (!) 57   Ht 5\' 5"  (1.651 m)   Wt 253 lb (114.8 kg)   BMI 42.10 kg/m    GEN: Well nourished, well developed, in no acute distress.  Obese. HEENT: normal . Neck: no JVD, carotid bruits, or masses. Cardiac: RRR; no murmurs, rubs, or gallops,no edema  Respiratory:  clear to auscultation bilaterally, normal work of breathing GI: soft, nontender, nondistended, + BS MS: no deformity or atrophy  Skin: warm and dry, no rash Neuro:  Alert and Oriented x 3, Strength and sensation are intact Psych: euthymic mood, full affect  Wt Readings from Last  3 Encounters:  04/08/17 253 lb (114.8 kg)  03/20/16 251 lb (113.9 kg)  02/06/16 248 lb (112.5 kg)      Studies/Labs Reviewed:   EKG:  EKG sinus bradycardia.  Normal otherwise.  Recent Labs: No results found for requested labs within last 8760 hours.   Lipid Panel    Component Value Date/Time   CHOL (H) 04/10/2010 0340    204        ATP III CLASSIFICATION:  <200     mg/dL   Desirable  161-096  mg/dL   Borderline High  >=045    mg/dL   High          TRIG 409 (H) 04/10/2010 0340   HDL 62 04/10/2010 0340   CHOLHDL 3.3 04/10/2010 0340   VLDL 77 (H) 04/10/2010 0340   LDLCALC  04/10/2010 0340    65        Total Cholesterol/HDL:CHD Risk Coronary Heart Disease Risk Table                     Men   Women  1/2 Average Risk   3.4   3.3  Average Risk       5.0   4.4  2 X Average Risk   9.6   7.1  3 X Average Risk  23.4   11.0        Use the calculated Patient Ratio above and the CHD Risk Table to determine the patient's CHD Risk.        ATP III CLASSIFICATION (LDL):  <100     mg/dL   Optimal  811-914  mg/dL   Near or Above                    Optimal  130-159  mg/dL   Borderline  782-956  mg/dL   High  >213     mg/dL   Very High    Additional studies/ records that were reviewed today include:  none    ASSESSMENT:    1. Paroxysmal atrial fibrillation (HCC)   2. Benign essential HTN   3. Essential hypertension   4. OSA on CPAP   5. Encounter for monitoring flecainide therapy      PLAN:  In order of problems listed above:  1. Stable on current medical regimen. 2. Excellent control. 3. Compliant with CPAP. 4. EKG is unremarkable.  Clinical follow-up in 1 year.  Call if syncope, prolonged atrial fibrillation, medication side effects, or other.  Medication Adjustments/Labs and Tests Ordered: Current medicines are reviewed at length with the patient today.  Concerns regarding medicines are outlined above.  Medication changes, Labs and Tests ordered today are  listed in the Patient Instructions below. Patient Instructions  Medication Instructions:  Your physician recommends that you continue on your current medications as directed. Please refer to the Current Medication list given to you today.  Labwork: None  Testing/Procedures: None  Follow-Up: Your physician wants you to follow-up in: 1 year with Dr. Katrinka Blazing. You will receive a reminder letter in the mail two months in advance. If you don't receive a letter, please call our office to schedule the follow-up appointment.   Any Other Special Instructions Will Be Listed Below (If Applicable).     If you need a refill on your cardiac medications before your next appointment, please call your pharmacy.      Signed, Lesleigh Noe, MD  04/08/2017 8:56 AM    [facility] [address] [place], [place], [place]  [address] Phone: [phone]; Fax: [phone]

## 2017-04-08 ENCOUNTER — Ambulatory Visit: Payer: BLUE CROSS/BLUE SHIELD | Admitting: Interventional Cardiology

## 2017-04-08 ENCOUNTER — Encounter: Payer: Self-pay | Admitting: Interventional Cardiology

## 2017-04-08 VITALS — BP 112/86 | HR 57 | Ht 65.0 in | Wt 253.0 lb

## 2017-04-08 DIAGNOSIS — I10 Essential (primary) hypertension: Secondary | ICD-10-CM

## 2017-04-08 DIAGNOSIS — G4733 Obstructive sleep apnea (adult) (pediatric): Secondary | ICD-10-CM

## 2017-04-08 DIAGNOSIS — I480 Paroxysmal atrial fibrillation: Secondary | ICD-10-CM | POA: Diagnosis not present

## 2017-04-08 DIAGNOSIS — Z79899 Other long term (current) drug therapy: Secondary | ICD-10-CM | POA: Diagnosis not present

## 2017-04-08 DIAGNOSIS — Z5181 Encounter for therapeutic drug level monitoring: Secondary | ICD-10-CM

## 2017-04-08 DIAGNOSIS — Z9989 Dependence on other enabling machines and devices: Secondary | ICD-10-CM

## 2017-04-08 NOTE — Patient Instructions (Signed)

## 2017-04-13 DIAGNOSIS — G4733 Obstructive sleep apnea (adult) (pediatric): Secondary | ICD-10-CM | POA: Diagnosis not present

## 2017-04-22 NOTE — Progress Notes (Signed)
Cardiology Office Note:    Date:  04/23/2017   ID:  Catherine Callahan, DOB 11-10-1955, MRN [id number]  PCP:  Catherine Ada, MD  Cardiologist:  Catherine Grooms, MD    Referring MD: Catherine Ada, MD   Chief Complaint  Patient presents with  . Sleep Apnea  . Hypertension    History of Present Illness:    Catherine Callahan is a 62 y.o. female with a hx of OSA, HTN and obesity.  She is doing well with her CPAP device.  She tolerates the full face mask and feels the pressure is adequate.  Since going on CPAP she used to feel rested in the am but she is not feeling as much any more.  She goes to bed around 10pm and falls asleep within 30 minutes. She gets up to go to the bathroom once nightly and for the most part goes right back to sleep.  She has been taking daytime naps recently. She denies any significant mouth or nasal dryness or nasal congestion.  She does not think that she snores.     Past Medical History:  Diagnosis Date  . Arthritis   . Atrial flutter ([place]) 01/05/2013  . Encounter for long-term (current) use of other medications 01/11/2011  . Hypertension, essential, benign 01/11/2011   pt denies this  . OSA on CPAP 01/05/2013  . Overweight   . Paroxysmal atrial fibrillation (HCC)   . Ulcerative colitis [facility])     Past Surgical History:  Procedure Laterality Date  . COLON SURGERY  2004   colon resection    Current Medications: Current Meds  Medication Sig  . aspirin 81 MG tablet Take 81 mg by mouth daily.  Marland Kitchen atorvastatin (LIPITOR) 80 MG tablet Take 80 mg by mouth daily. (Pt is to stop Crestor 40 mg daily on 01/29/14 and start Atorvastatin 80 mg daily)  . flecainide (TAMBOCOR) 150 MG tablet TAKE 1 TABLET BY MOUTH TWICE A DAY  . Mesalamine (ASACOL HD) 800 MG TBEC Take 800 mg by mouth daily.   . metoprolol succinate (TOPROL-XL) 50 MG 24 hr tablet TAKE 1 TABLET (50 MG TOTAL) BY MOUTH 2 (TWO) TIMES DAILY.  . Multiple Vitamin (MULTI-VITAMINS) TABS Take 1 capsule by  mouth daily.      Allergies:   Penicillin g; Penicillins; Meloxicam; and Latex   Social History   Socioeconomic History  . Marital status: Widowed    Spouse name: Catherine Callahan  . Number of children: Catherine Callahan  . Years of education: Catherine Callahan  . Highest education level: Catherine Callahan  Social Needs  . Financial resource strain: Catherine Callahan  . Food insecurity - worry: Catherine Callahan  . Food insecurity - inability: Catherine Callahan  . Transportation needs - medical: Catherine Callahan  . Transportation needs - non-medical: Catherine Callahan  Occupational History  . Catherine Callahan  Tobacco Use  . Smoking status: Never Smoker  . Smokeless tobacco: Never Used  Substance and Sexual Activity  . Alcohol use: No  . Drug use: No  . Sexual activity: Catherine Callahan  Other Topics Concern  . Catherine Callahan  Social History Narrative   Pt lives in [place] alone.  Widowed.  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     Family History: The patient's family history includes Alzheimer's disease in her mother; Atrial fibrillation in her father; CAD (age of onset: 32) in her brother; Healthy in her brother and sister; Heart disease in her brother and  mother; Other in her brother; Stroke in her father.  ROS:   Please see the history of present illness.    Review of Systems  Respiratory: Positive for cough.     All other systems reviewed and negative.   EKGs/Labs/Other Studies Reviewed:    The following studies were reviewed today: PAP download  EKG:  EKG is not ordered today  Recent Labs: No results found for requested labs within last 8760 hours.   Recent Lipid Panel    Component Value Date/Time   CHOL (H) 04/10/2010 0340    204        ATP III CLASSIFICATION:  <200     mg/dL   Desirable  200-239  mg/dL   Borderline High  >=240    mg/dL   High          TRIG 385 (H) 04/10/2010 0340   HDL 62 04/10/2010 0340   CHOLHDL 3.3 04/10/2010 0340   VLDL 77 (H) 04/10/2010 0340   LDLCALC  04/10/2010 0340    65        Total  Cholesterol/HDL:CHD Risk Coronary Heart Disease Risk Table                     Men   Women  1/2 Average Risk   3.4   3.3  Average Risk       5.0   4.4  2 X Average Risk   9.6   7.1  3 X Average Risk  23.4   11.0        Use the calculated Patient Ratio above and the CHD Risk Table to determine the patient's CHD Risk.        ATP III CLASSIFICATION (LDL):  <100     mg/dL   Optimal  100-129  mg/dL   Near or Above                    Optimal  130-159  mg/dL   Borderline  160-189  mg/dL   High  >190     mg/dL   Very High    Physical Exam:    VS:  BP 128/82   Pulse 64   Ht 5\' 5"  (1.651 m)   Wt 245 lb (111.1 kg)   SpO2 96%   BMI 40.77 kg/m     Wt Readings from Last 3 Encounters:  04/23/17 245 lb (111.1 kg)  04/08/17 253 lb (114.8 kg)  03/20/16 251 lb (113.9 kg)     GEN:  Well nourished, well developed in no acute distress HEENT: Normal NECK: No JVD; No carotid bruits LYMPHATICS: No lymphadenopathy CARDIAC: RRR, no murmurs, rubs, gallops RESPIRATORY:  Clear to auscultation without rales, wheezing or rhonchi  ABDOMEN: Soft, non-tender, non-distended MUSCULOSKELETAL:  No edema; No deformity  SKIN: Warm and dry NEUROLOGIC:  Alert and oriented x 3 PSYCHIATRIC:  Normal affect   ASSESSMENT:    1. OSA on CPAP   2. Benign essential HTN   3. Obesity (BMI 30-39.9)    PLAN:    In order of problems listed above:  1.  OSA - the patient is tolerating PAP therapy well without any problems. The PAP download was reviewed today and showed an AHI of 1.3/hr on 14 cm H2O with 60% compliance in using more than 4 hours nightly.  The patient has been using and benefiting from PAP use and will continue to benefit from therapy.  Her compliance is down because she deleted some  data when trying to get her card downloaded.  2.  HTN - BP is well controlled on exam today.  She will continue on Toprol XL 50mg  daily.  3.  Obesity - I have encouraged her to get into a routine exercise program and  cut back on carbs and portions.     Medication Adjustments/Labs and Tests Ordered: Current medicines are reviewed at length with the patient today.  Concerns regarding medicines are outlined above.  No orders of the defined types were placed in this encounter.  No orders of the defined types were placed in this encounter.   Signed, Fransico Him, MD  04/23/2017 8:25 AM    [place]

## 2017-04-23 ENCOUNTER — Encounter: Payer: Self-pay | Admitting: Cardiology

## 2017-04-23 ENCOUNTER — Ambulatory Visit: Payer: BLUE CROSS/BLUE SHIELD | Admitting: Cardiology

## 2017-04-23 VITALS — BP 128/82 | HR 64 | Ht 65.0 in | Wt 245.0 lb

## 2017-04-23 DIAGNOSIS — G4733 Obstructive sleep apnea (adult) (pediatric): Secondary | ICD-10-CM

## 2017-04-23 DIAGNOSIS — I10 Essential (primary) hypertension: Secondary | ICD-10-CM

## 2017-04-23 DIAGNOSIS — Z9989 Dependence on other enabling machines and devices: Secondary | ICD-10-CM | POA: Diagnosis not present

## 2017-04-23 DIAGNOSIS — E669 Obesity, unspecified: Secondary | ICD-10-CM | POA: Diagnosis not present

## 2017-04-23 NOTE — Patient Instructions (Signed)

## 2017-05-20 ENCOUNTER — Other Ambulatory Visit: Payer: Self-pay | Admitting: Interventional Cardiology

## 2017-05-26 ENCOUNTER — Other Ambulatory Visit: Payer: Self-pay | Admitting: Interventional Cardiology

## 2017-07-19 ENCOUNTER — Other Ambulatory Visit: Payer: Self-pay | Admitting: Family Medicine

## 2017-07-19 DIAGNOSIS — Z1231 Encounter for screening mammogram for malignant neoplasm of breast: Secondary | ICD-10-CM

## 2017-08-26 ENCOUNTER — Ambulatory Visit: Payer: BLUE CROSS/BLUE SHIELD

## 2017-09-06 ENCOUNTER — Ambulatory Visit: Payer: BLUE CROSS/BLUE SHIELD

## 2017-09-12 ENCOUNTER — Ambulatory Visit: Payer: BLUE CROSS/BLUE SHIELD

## 2017-10-15 ENCOUNTER — Telehealth: Payer: Self-pay | Admitting: Interventional Cardiology

## 2017-10-15 DIAGNOSIS — I4891 Unspecified atrial fibrillation: Secondary | ICD-10-CM

## 2017-10-15 NOTE — Telephone Encounter (Signed)
Increase Toprol-XL to 75 mg twice daily.  Monitor blood pressure.  48-hour Holter monitor after 1 week.

## 2017-10-15 NOTE — Telephone Encounter (Signed)
Returned call to patient who states that the past month she has been having more frequent episodes of her heart going out of rhythm. She states that it is happening 2-3 times per week. She states that it does not feel fast that it just feels irregular. She states that she was only SOB on 2 occasions and it was on exertion so she just sat down and rested. She denies any other episodes of SOB. Patient denies any chest pain, lightheadedness, dizziness, syncope, or any other Sx. Patient states that her BP has been 120/80s and HRs 70-80s. Patient states that she takes Flecainide 150 mg BID and Toprol 50 mg BID. Patient states that when she has these episodes that when she has these irregular episodes that she takes an extra Toprol 25 mg q 30 min until she comes out of the rhythm. Patient states that one time she required 4 doses, but she usually goes back into rhythm after 1-2 doses. She states that she was instructed to do this by Dr. Katrinka BlazingSmith. Patient denies any Sx at this time. Patient is not anticoagulated. Made patient aware that I would forward to Dr. Katrinka BlazingSmith and his nurse for review and recommendation. Instructed patient to let us know if her Sx changed or worsened. Patient verbalized understanding and thanked me for the call.

## 2017-10-15 NOTE — Telephone Encounter (Signed)
New message  Patient states that she is going out rhythm now about 2 or 3 times a week. The patient states that states that she needs to go up  metpropralol an additional 25 to 50 mg per dose. Patient states that she currently takes 50 mg bid twice a day. Patient states that she is also on Flecainide 1 50 mg bid daily.  Patient wants to know which medication she needs to go up on.

## 2017-10-16 MED ORDER — METOPROLOL SUCCINATE ER 25 MG PO TB24: 75.0000 mg | ORAL_TABLET | Freq: Two times a day (BID) | ORAL | 3 refills | Status: DC

## 2017-10-16 NOTE — Telephone Encounter (Signed)
Follow up   Pt returning call for [facility]

## 2017-10-16 NOTE — Telephone Encounter (Signed)
Left message to call back  

## 2017-10-16 NOTE — Telephone Encounter (Signed)
Spoke with pt and went over recommendations per Dr. Katrinka BlazingSmith.  Attempted to send in Meto Succ 50- 1.5 tabs BID but system would not let me.  Sent in prescription for 25mg  tabs- take 3 tabs BID.

## 2017-10-17 ENCOUNTER — Ambulatory Visit: Payer: BLUE CROSS/BLUE SHIELD

## 2017-10-28 ENCOUNTER — Ambulatory Visit (INDEPENDENT_AMBULATORY_CARE_PROVIDER_SITE_OTHER): Payer: BLUE CROSS/BLUE SHIELD

## 2017-10-28 DIAGNOSIS — R002 Palpitations: Secondary | ICD-10-CM | POA: Diagnosis not present

## 2017-10-28 DIAGNOSIS — I4891 Unspecified atrial fibrillation: Secondary | ICD-10-CM | POA: Diagnosis not present

## 2017-10-30 DIAGNOSIS — S30861A Insect bite (nonvenomous) of abdominal wall, initial encounter: Secondary | ICD-10-CM | POA: Diagnosis not present

## 2017-10-30 DIAGNOSIS — W57XXXA Bitten or stung by nonvenomous insect and other nonvenomous arthropods, initial encounter: Secondary | ICD-10-CM | POA: Diagnosis not present

## 2017-10-30 DIAGNOSIS — A6920 Lyme disease, unspecified: Secondary | ICD-10-CM | POA: Diagnosis not present

## 2017-11-05 DIAGNOSIS — G4733 Obstructive sleep apnea (adult) (pediatric): Secondary | ICD-10-CM | POA: Diagnosis not present

## 2017-11-11 ENCOUNTER — Ambulatory Visit
Admission: RE | Admit: 2017-11-11 | Discharge: 2017-11-11 | Disposition: A | Discharge status: Home / Self Care | Payer: BLUE CROSS/BLUE SHIELD | Source: Ambulatory Visit | Attending: Family Medicine | Admitting: Family Medicine

## 2017-11-11 DIAGNOSIS — Z1231 Encounter for screening mammogram for malignant neoplasm of breast: Secondary | ICD-10-CM | POA: Diagnosis not present

## 2017-12-27 DIAGNOSIS — M25562 Pain in left knee: Secondary | ICD-10-CM | POA: Diagnosis not present

## 2018-02-12 ENCOUNTER — Other Ambulatory Visit: Payer: Self-pay | Admitting: Interventional Cardiology

## 2018-03-03 ENCOUNTER — Other Ambulatory Visit: Payer: Self-pay | Admitting: Interventional Cardiology

## 2018-03-03 MED ORDER — FLECAINIDE ACETATE 150 MG PO TABS: 150.0000 mg | ORAL_TABLET | Freq: Two times a day (BID) | ORAL | 0 refills | Status: DC

## 2018-03-27 ENCOUNTER — Encounter: Payer: Self-pay | Admitting: Interventional Cardiology

## 2018-04-02 DIAGNOSIS — M791 Myalgia, unspecified site: Secondary | ICD-10-CM | POA: Diagnosis not present

## 2018-04-02 DIAGNOSIS — M25511 Pain in right shoulder: Secondary | ICD-10-CM | POA: Diagnosis not present

## 2018-04-02 DIAGNOSIS — R29898 Other symptoms and signs involving the musculoskeletal system: Secondary | ICD-10-CM | POA: Diagnosis not present

## 2018-04-02 DIAGNOSIS — I471 Supraventricular tachycardia: Secondary | ICD-10-CM | POA: Diagnosis not present

## 2018-04-02 DIAGNOSIS — E782 Mixed hyperlipidemia: Secondary | ICD-10-CM | POA: Diagnosis not present

## 2018-04-09 DIAGNOSIS — S76301A Unspecified injury of muscle, fascia and tendon of the posterior muscle group at thigh level, right thigh, initial encounter: Secondary | ICD-10-CM | POA: Diagnosis not present

## 2018-04-09 DIAGNOSIS — M25511 Pain in right shoulder: Secondary | ICD-10-CM | POA: Diagnosis not present

## 2018-04-14 NOTE — Progress Notes (Deleted)
Cardiology Office Note:    Date:  04/14/2018   ID:  MALAISHA KNOPIK, DOB 17-Dec-1955, MRN [id number]  PCP:  Carol Ada, MD  Cardiologist:  Sinclair Grooms, MD   Referring MD: Carol Ada, MD   No chief complaint on file.   History of Present Illness:    Catherine Callahan is a 63 y.o. female with a hx of paroxysmal atrial fibrillation, flecainide therapy, obesity, essential hypertension, and obstructive sleep apnea.  Past Medical History:  Diagnosis Date  . Arthritis   . Atrial flutter ([place]) 01/05/2013  . Encounter for long-term (current) use of other medications 01/11/2011  . Hypertension, essential, benign 01/11/2011   pt denies this  . OSA on CPAP 01/05/2013  . Overweight   . Paroxysmal atrial fibrillation (HCC)   . Ulcerative colitis [facility])     Past Surgical History:  Procedure Laterality Date  . COLON SURGERY  2004   colon resection    Current Medications: No outpatient medications have been marked as taking for the 04/15/18 encounter (Appointment) with Belva Crome, MD.     Allergies:   Penicillin g; Penicillins; Meloxicam; and Latex   Social History   Socioeconomic History  . Marital status: Widowed    Spouse name: Not on file  . Number of children: Not on file  . Years of education: Not on file  . Highest education level: Not on file  Occupational History  . Not on file  Social Needs  . Financial resource strain: Not on file  . Food insecurity:    Worry: Not on file    Inability: Not on file  . Transportation needs:    Medical: Not on file    Non-medical: Not on file  Tobacco Use  . Smoking status: Never Smoker  . Smokeless tobacco: Never Used  Substance and Sexual Activity  . Alcohol use: No  . Drug use: No  . Sexual activity: Not on file  Lifestyle  . Physical activity:    Days per week: Not on file    Minutes per session: Not on file  . Stress: Not on file  Relationships  . Social connections:    Talks on phone: Not on file      Gets together: Not on file    Attends religious service: Not on file    Active member of club or organization: Not on file    Attends meetings of clubs or organizations: Not on file    Relationship status: Not on file  Other Topics Concern  . Not on file  Social History Narrative   Pt lives in [place] alone.  Widowed.  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     Family History: The patient's family history includes Alzheimer's disease in her mother; Atrial fibrillation in her father; CAD (age of onset: 27) in her brother; Healthy in her brother and sister; Heart disease in her brother and mother; Other in her brother; Stroke in her father.  ROS:   Please see the history of present illness.    *** All other systems reviewed and are negative.  EKGs/Labs/Other Studies Reviewed:    The following studies were reviewed today: ***  EKG:  EKG ***  Recent Labs: No results found for requested labs within last 8760 hours.  Recent Lipid Panel    Component Value Date/Time   CHOL (H) 04/10/2010 0340    204  ATP III CLASSIFICATION:  <200     mg/dL   Desirable  200-239  mg/dL   Borderline High  >=240    mg/dL   High          TRIG 385 (H) 04/10/2010 0340   HDL 62 04/10/2010 0340   CHOLHDL 3.3 04/10/2010 0340   VLDL 77 (H) 04/10/2010 0340   LDLCALC  04/10/2010 0340    65        Total Cholesterol/HDL:CHD Risk Coronary Heart Disease Risk Table                     Men   Women  1/2 Average Risk   3.4   3.3  Average Risk       5.0   4.4  2 X Average Risk   9.6   7.1  3 X Average Risk  23.4   11.0        Use the calculated Patient Ratio above and the CHD Risk Table to determine the patient's CHD Risk.        ATP III CLASSIFICATION (LDL):  <100     mg/dL   Optimal  100-129  mg/dL   Near or Above                    Optimal  130-159  mg/dL   Borderline  160-189  mg/dL   High  >190     mg/dL   Very  High    Physical Exam:    VS:  There were no vitals taken for this visit.    Wt Readings from Last 3 Encounters:  04/23/17 245 lb (111.1 kg)  04/08/17 253 lb (114.8 kg)  03/20/16 251 lb (113.9 kg)     GEN: ***. No acute distress HEENT: Normal NECK: No JVD. LYMPHATICS: No lymphadenopathy CARDIAC: ***RRR.  *** murmur, ***gallop, ***edema VASCULAR: *** Pulses, *** bruits RESPIRATORY:  Clear to auscultation without rales, wheezing or rhonchi  ABDOMEN: Soft, non-tender, non-distended, No pulsatile mass, MUSCULOSKELETAL: No deformity  SKIN: Warm and dry NEUROLOGIC:  Alert and oriented x 3 PSYCHIATRIC:  Normal affect   ASSESSMENT:    1. Atrial fibrillation, unspecified type ([place])   2. OSA on CPAP   3. Encounter for monitoring flecainide therapy   4. Benign essential HTN   5. Essential hypertension   6. Obesity (BMI 30-39.9)    PLAN:    In order of problems listed above:  1. ***   Medication Adjustments/Labs and Tests Ordered: Current medicines are reviewed at length with the patient today.  Concerns regarding medicines are outlined above.  No orders of the defined types were placed in this encounter.  No orders of the defined types were placed in this encounter.   There are no Patient Instructions on file for this visit.   Signed, Sinclair Grooms, MD  04/14/2018 5:28 PM    [place]

## 2018-04-15 ENCOUNTER — Ambulatory Visit: Payer: BLUE CROSS/BLUE SHIELD | Admitting: Interventional Cardiology

## 2018-04-16 DIAGNOSIS — M25511 Pain in right shoulder: Secondary | ICD-10-CM | POA: Diagnosis not present

## 2018-04-21 DIAGNOSIS — S76301D Unspecified injury of muscle, fascia and tendon of the posterior muscle group at thigh level, right thigh, subsequent encounter: Secondary | ICD-10-CM | POA: Diagnosis not present

## 2018-04-24 DIAGNOSIS — S76301D Unspecified injury of muscle, fascia and tendon of the posterior muscle group at thigh level, right thigh, subsequent encounter: Secondary | ICD-10-CM | POA: Diagnosis not present

## 2018-04-28 DIAGNOSIS — M25511 Pain in right shoulder: Secondary | ICD-10-CM | POA: Diagnosis not present

## 2018-05-29 DIAGNOSIS — G4733 Obstructive sleep apnea (adult) (pediatric): Secondary | ICD-10-CM | POA: Diagnosis not present

## 2018-05-30 ENCOUNTER — Telehealth: Payer: Self-pay

## 2018-05-30 NOTE — Telephone Encounter (Signed)
Follow Up: ° ° ° ° °Pt returning your call. °

## 2018-05-30 NOTE — Telephone Encounter (Signed)
TELEPHONE CALL NOTE  Catherine Callahan has been deemed a candidate for a follow-up tele-health visit to limit community exposure during the Covid-19 pandemic. I spoke with the patient via phone to ensure availability of phone/video source, confirm preferred email & phone number, and discuss instructions and expectations.  I reminded Catherine Callahan to be prepared with any vital sign and/or heart rhythm information that could potentially be obtained via home monitoring, at the time of her visit. I reminded Catherine Callahan to expect a phone call at the time of her visit if her visit. Her visit is 1:40 pm on 06/03/18.   Did the patient verbally acknowledge consent to treatment? [place], RN 05/30/2018 3:22 PM   DOWNLOADING THE [place]  - If Apple, go to [employer] and type in WebEx in the search bar. [place] [employer], the blue/green circle. The app is free but as with any other app downloads, their phone may require them to verify saved payment information or Apple password. The patient does NOT have to create an account.  - If Android, ask patient to go to [employer] and type in WebEx in the search bar. [place] [employer], the blue/green circle. The app is free but as with any other app downloads, their phone may require them to verify saved payment information or Android password. The patient does NOT have to create an account.   CONSENT FOR TELE-HEALTH VISIT - PLEASE REVIEW  I hereby voluntarily request, consent and authorize [facility] and its employed or contracted physicians, physician assistants, nurse practitioners or other licensed health care professionals (the Practitioner), to provide me with telemedicine health care services (the "Services") as deemed necessary by the treating Practitioner. I acknowledge and consent to receive the Services by the Practitioner via telemedicine. I understand that the telemedicine  visit will involve communicating with the Practitioner through live audiovisual communication technology and the disclosure of certain medical information by electronic transmission. I acknowledge that I have been given the opportunity to request an in-person assessment or other available alternative prior to the telemedicine visit and am voluntarily participating in the telemedicine visit.  I understand that I have the right to withhold or withdraw my consent to the use of telemedicine in the course of my care at any time, without affecting my right to future care or treatment, and that the Practitioner or I may terminate the telemedicine visit at any time. I understand that I have the right to inspect all information obtained and/or recorded in the course of the telemedicine visit and may receive copies of available information for a reasonable fee.  I understand that some of the potential risks of receiving the Services via telemedicine include:  Marland Kitchen Delay or interruption in medical evaluation due to technological equipment failure or disruption; . Information transmitted may not be sufficient (e.g. poor resolution of images) to allow for appropriate medical decision making by the Practitioner; and/or  . In rare instances, security protocols could fail, causing a breach of personal health information.  Furthermore, I acknowledge that it is my responsibility to provide information about my medical history, conditions and care that is complete and accurate to the best of my ability. I acknowledge that Practitioner's advice, recommendations, and/or decision may be based on factors not within their control, such as incomplete or inaccurate data provided by me or distortions of diagnostic images or specimens that may result from electronic transmissions. I understand that  the practice of medicine is not an exact science and that Practitioner makes no warranties or guarantees regarding treatment outcomes. I  acknowledge that I will receive a copy of this consent concurrently upon execution via email to the email address I last provided but may also request a printed copy by calling the office of [facility].    I understand that my insurance will be billed for this visit.   I have read or had this consent read to me. . I understand the contents of this consent, which adequately explains the benefits and risks of the Services being provided via telemedicine.  . I have been provided ample opportunity to ask questions regarding this consent and the Services and have had my questions answered to my satisfaction. . I give my informed consent for the services to be provided through the use of telemedicine in my medical care  By participating in this telemedicine visit I agree to the above.

## 2018-05-30 NOTE — Telephone Encounter (Signed)
Left a message for the patient to call back about changing her appointment.

## 2018-06-02 NOTE — Progress Notes (Addendum)
Virtual Visit via Video Note    Evaluation Performed:  Follow-up visit  This visit type was conducted due to national recommendations for restrictions regarding the COVID-19 Pandemic (e.g. social distancing).  This format is felt to be most appropriate for this patient at this time.  All issues noted in this document were discussed and addressed.  No physical exam was performed (except for noted visual exam findings with Video Visits).  Please refer to the patient's chart (MyChart message for video visits and phone note for telephone visits) for the patient's consent to telehealth for [facility].  Date:  06/02/2018   ID:  Catherine Callahan, DOB 1955/09/21, MRN [id number]  Patient Location:  Home  Provider location:   [place]  PCP:  Merri Brunette, MD  Cardiologist:  Lesleigh Noe, MD  Sleep Medicine: Armanda Magic, MD  Chief Complaint:  OSA  History of Present Illness:    Catherine Callahan is a 63 y.o. female who presents via audio/video conferencing for a telehealth visit today.    This is a 63yo female with a history of OSA, HTN and obesity.  He is doing well with his CPAP device and thinks that he has gotten used to it.  He tolerates the mask and feels the pressure is adequate.  Since going on CPAP he feels rested in the am and has no significant daytime sleepiness.  He denies any significant mouth or nasal dryness or nasal congestion.  he does not think that he snores.    The patient does not symptoms concerning for COVID-19 infection (fever, chills, cough, or new shortness of breath).    Prior CV studies:   The following studies were reviewed today:  PAP download  Past Medical History:  Diagnosis Date  . Arthritis   . Atrial flutter (HCC) 01/05/2013  . Encounter for long-term (current) use of other medications 01/11/2011  . Hypertension, essential, benign 01/11/2011   pt denies this  . OSA on CPAP 01/05/2013  . Overweight   . Paroxysmal atrial fibrillation  (HCC)   . Ulcerative colitis [facility])    Past Surgical History:  Procedure Laterality Date  . COLON SURGERY  2004   colon resection     No outpatient medications have been marked as taking for the 06/03/18 encounter (Appointment) with Quintella Reichert, MD.     Allergies:   Penicillin g; Penicillins; Meloxicam; and Latex   Social History   Tobacco Use  . Smoking status: Never Smoker  . Smokeless tobacco: Never Used  Substance Use Topics  . Alcohol use: No  . Drug use: No     Family Hx: The patient's family history includes Alzheimer's disease in her mother; Atrial fibrillation in her father; CAD (age of onset: 74) in her brother; Healthy in her brother and sister; Heart disease in her brother and mother; Other in her brother; Stroke in her father.  ROS:   Please see the history of present illness.     All other systems reviewed and are negative.   Labs/Other Tests and Data Reviewed:    Recent Labs: No results found for requested labs within last 8760 hours.   Recent Lipid Panel Lab Results  Component Value Date/Time   CHOL (H) 04/10/2010 03:40 AM    204        ATP III CLASSIFICATION:  <200     mg/dL   Desirable  485-927  mg/dL   Borderline High  >=639    mg/dL  High          TRIG 385 (H) 04/10/2010 03:40 AM   HDL 62 04/10/2010 03:40 AM   CHOLHDL 3.3 04/10/2010 03:40 AM   LDLCALC  04/10/2010 03:40 AM    65        Total Cholesterol/HDL:CHD Risk Coronary Heart Disease Risk Table                     Men   Women  1/2 Average Risk   3.4   3.3  Average Risk       5.0   4.4  2 X Average Risk   9.6   7.1  3 X Average Risk  23.4   11.0        Use the calculated Patient Ratio above and the CHD Risk Table to determine the patient's CHD Risk.        ATP III CLASSIFICATION (LDL):  <100     mg/dL   Optimal  093-235  mg/dL   Near or Above                    Optimal  130-159  mg/dL   Borderline  573-220  mg/dL   High  >254     mg/dL   Very High    Wt Readings from  Last 3 Encounters:  04/23/17 245 lb (111.1 kg)  04/08/17 253 lb (114.8 kg)  03/20/16 251 lb (113.9 kg)     Objective:    Vital Signs:  There were no vitals taken for this visit.   Well nourished, well developed female in no acute distress. Well appearing, alert and conversant, regular work of breathing,  good skin color  Eyes- anicteric mouth- oral mucosa is pink  neuro- grossly intact skin- no apparent rash or lesions or cyanosis   ASSESSMENT & PLAN:    1.  OSA - the patient is tolerating PAP therapy well without any problems.  The patient has been using and benefiting from PAP use and will continue to benefit from therapy. I will get a download from her DME.  2.  HTN - she follows her BP at home which is controlled.  She will continue on Toprol XL 25mg  daily.    3.  Obesity - I have encouraged her to get into a routine exercise program and cut back on carbs and portions.     COVID-19 Education: The signs and symptoms of COVID-19 were discussed with the patient and how to seek care for testing (follow up with PCP or arrange E-visit).  The importance of social distancing was discussed today.  Patient Risk:   After full review of this patient's clinical status, I feel that they are at least moderate risk at this time.  Time:   Today, I have spent 25 minutes with the patient with telehealth technology discussing obstructive sleep apnea and treatment, encouraging healthy diet and exercise, good sleep hygiene and good practices for staying healthy during COVID crisis   Medication Adjustments/Labs and Tests Ordered: Current medicines are reviewed at length with the patient today.  Concerns regarding medicines are outlined above.  Tests Ordered: No orders of the defined types were placed in this encounter.  Medication Changes: No orders of the defined types were placed in this encounter.   Disposition:  Follow up in 1 year(s)  Signed, Armanda Magic, MD  06/02/2018 9:59 PM     [facility]

## 2018-06-03 ENCOUNTER — Encounter: Payer: Self-pay | Admitting: Cardiology

## 2018-06-03 ENCOUNTER — Other Ambulatory Visit: Payer: Self-pay

## 2018-06-03 ENCOUNTER — Ambulatory Visit: Payer: BLUE CROSS/BLUE SHIELD | Admitting: Cardiology

## 2018-06-03 ENCOUNTER — Telehealth (INDEPENDENT_AMBULATORY_CARE_PROVIDER_SITE_OTHER): Payer: BLUE CROSS/BLUE SHIELD | Admitting: Cardiology

## 2018-06-03 VITALS — Ht 65.0 in | Wt 230.0 lb

## 2018-06-03 DIAGNOSIS — E669 Obesity, unspecified: Secondary | ICD-10-CM | POA: Diagnosis not present

## 2018-06-03 DIAGNOSIS — I10 Essential (primary) hypertension: Secondary | ICD-10-CM

## 2018-06-03 DIAGNOSIS — Z9989 Dependence on other enabling machines and devices: Secondary | ICD-10-CM

## 2018-06-03 DIAGNOSIS — G4733 Obstructive sleep apnea (adult) (pediatric): Secondary | ICD-10-CM | POA: Diagnosis not present

## 2018-06-03 NOTE — Telephone Encounter (Signed)
New Message            Patient is needing help setting up her virtual call with Dr. Mayford Knife at 1:40, if someone could call her right away to get this done would be awesome.

## 2018-06-03 NOTE — Telephone Encounter (Signed)
error 

## 2018-06-03 NOTE — Patient Instructions (Signed)

## 2018-07-04 ENCOUNTER — Telehealth: Payer: Self-pay | Admitting: Interventional Cardiology

## 2018-07-04 NOTE — Telephone Encounter (Signed)
Patient set up for MyChart? NO  Is patient using Smartphone/computer/tablet? YES  Did audio/video work?  Does patient need telephone visit? NO   Best phone number to use? [phone]  Special Instructions? PATIENT WILL HAVE VITALS READY BEFORE APPT       Virtual Visit Pre-Appointment Phone Call  "(Name), I am calling you today to discuss your upcoming appointment. We are currently trying to limit exposure to the virus that causes COVID-19 by seeing patients at home rather than in the office."  1. "What is the BEST phone number to call the day of the visit?" - include this in appointment notes  2. Do you have or have access to (through a family member/friend) a smartphone with video capability that we can use for your visit?" a. If yes - list this number in appt notes as cell (if different from BEST phone #) and list the appointment type as a VIDEO visit in appointment notes b. If no - list the appointment type as a PHONE visit in appointment notes  3. Confirm consent - "In the setting of the current Covid19 crisis, you are scheduled for a (phone or video) visit with your provider on (date) at (time).  Just as we do with many in-office visits, in order for you to participate in this visit, we must obtain consent.  If you'd like, I can send this to your mychart (if signed up) or email for you to review.  Otherwise, I can obtain your verbal consent now.  All virtual visits are billed to your insurance company just like a normal visit would be.  By agreeing to a virtual visit, we'd like you to understand that the technology does not allow for your provider to perform an examination, and thus may limit your provider's ability to fully assess your condition. If your provider identifies any concerns that need to be evaluated in person, we will make arrangements to do so.  Finally, though the technology is pretty good, we cannot assure that it will always work on either your or our end, and in  the setting of a video visit, we may have to convert it to a phone-only visit.  In either situation, we cannot ensure that we have a secure connection.  Are you willing to proceed?" STAFF: Did the patient verbally acknowledge consent to telehealth visit? Document YES/NO here: YES   4. Advise patient to be prepared - "Two hours prior to your appointment, go ahead and check your blood pressure, pulse, oxygen saturation, and your weight (if you have the equipment to check those) and write them all down. When your visit starts, your provider will ask you for this information. If you have an Apple Watch or Kardia device, please plan to have heart rate information ready on the day of your appointment. Please have a pen and paper handy nearby the day of the visit as well."  5. Give patient instructions for MyChart download to smartphone OR Doximity/Doxy.me as below if video visit (depending on what platform provider is using)  6. Inform patient they will receive a phone call 15 minutes prior to their appointment time (may be from unknown caller ID) so they should be prepared to answer    TELEPHONE CALL NOTE  Catherine Callahan has been deemed a candidate for a follow-up tele-health visit to limit community exposure during the Covid-19 pandemic. I spoke with the patient via phone to ensure availability of phone/video source, confirm preferred email & phone number,  and discuss instructions and expectations.  I reminded Catherine Callahan to be prepared with any vital sign and/or heart rhythm information that could potentially be obtained via home monitoring, at the time of her visit. I reminded Catherine Callahan to expect a phone call prior to her visit.  [name] 07/04/2018 4:48 PM   INSTRUCTIONS FOR DOWNLOADING THE MYCHART APP TO SMARTPHONE  - The patient must first make sure to have activated MyChart and know their login information - If Apple, go to [employer] and type in MyChart in the search  bar and download the app. If Android, ask patient to go to [employer] and type in [place] in the search bar and download the app. The app is free but as with any other app downloads, their phone may require them to verify saved payment information or Apple/Android password.  - The patient will need to then log into the app with their MyChart username and password, and select [facility] as their healthcare provider to link the account. When it is time for your visit, go to the MyChart app, find appointments, and click Begin Video Visit. Be sure to Select Allow for your device to access the Microphone and Camera for your visit. You will then be connected, and your provider will be with you shortly.  **If they have any issues connecting, or need assistance please contact MyChart service desk [phone])**  **If using a computer, in order to ensure the best quality for their visit they will need to use either of the following Internet Browsers: [employer], or Google Chrome**  IF USING DOXIMITY or DOXY.ME - The patient will receive a link just prior to their visit by text.     FULL LENGTH CONSENT FOR TELE-HEALTH VISIT   I hereby voluntarily request, consent and authorize [facility] and its employed or contracted physicians, physician assistants, nurse practitioners or other licensed health care professionals (the Practitioner), to provide me with telemedicine health care services (the Services") as deemed necessary by the treating Practitioner. I acknowledge and consent to receive the Services by the Practitioner via telemedicine. I understand that the telemedicine visit will involve communicating with the Practitioner through live audiovisual communication technology and the disclosure of certain medical information by electronic transmission. I acknowledge that I have been given the opportunity to request an in-person assessment or other available alternative prior to the  telemedicine visit and am voluntarily participating in the telemedicine visit.  I understand that I have the right to withhold or withdraw my consent to the use of telemedicine in the course of my care at any time, without affecting my right to future care or treatment, and that the Practitioner or I may terminate the telemedicine visit at any time. I understand that I have the right to inspect all information obtained and/or recorded in the course of the telemedicine visit and may receive copies of available information for a reasonable fee.  I understand that some of the potential risks of receiving the Services via telemedicine include:   Delay or interruption in medical evaluation due to technological equipment failure or disruption;  Information transmitted may not be sufficient (e.g. poor resolution of images) to allow for appropriate medical decision making by the Practitioner; and/or   In rare instances, security protocols could fail, causing a breach of personal health information.  Furthermore, I acknowledge that it is my responsibility to provide information about my medical history, conditions and care that is complete  and accurate to the best of my ability. I acknowledge that Practitioner's advice, recommendations, and/or decision may be based on factors not within their control, such as incomplete or inaccurate data provided by me or distortions of diagnostic images or specimens that may result from electronic transmissions. I understand that the practice of medicine is not an exact science and that Practitioner makes no warranties or guarantees regarding treatment outcomes. I acknowledge that I will receive a copy of this consent concurrently upon execution via email to the email address I last provided but may also request a printed copy by calling the office of [facility].    I understand that my insurance will be billed for this visit.   I have read or had this consent read to  me.  I understand the contents of this consent, which adequately explains the benefits and risks of the Services being provided via telemedicine.   I have been provided ample opportunity to ask questions regarding this consent and the Services and have had my questions answered to my satisfaction.  I give my informed consent for the services to be provided through the use of telemedicine in my medical care  By participating in this telemedicine visit I agree to the above.

## 2018-07-04 NOTE — Telephone Encounter (Signed)
New Message   I don't see any appointment times on 5/6 but theres a lot of blocks on the schedule so didn't know if you had one blocked for patient.  Please call her back to schedule and get consent.  Patient is aware of Virtual visit.

## 2018-07-04 NOTE — Telephone Encounter (Signed)
Left message on vm to offer patient earlier virtual appt with Dr Katrinka Blazing , we have openings on May 6 , also need to have consent

## 2018-07-08 ENCOUNTER — Telehealth: Payer: Self-pay

## 2018-07-08 NOTE — Progress Notes (Deleted)
{Choose 1 Note Type (Telehealth Visit or Telephone Visit):[phone]}   Date:  07/08/2018   ID:  Catherine Callahan, DOB 12/21/1955, MRN [id number]  {Patient Location:[id number]::"Home"} {Provider Location:[phone]::"Home"}  PCP:  Carol Ada, MD  Cardiologist:  Sinclair Grooms, MD *** Electrophysiologist:  None   Evaluation Performed:  {Choose Visit Type:[phone]::"Follow-Up Visit"}  Chief Complaint:  ***  History of Present Illness:    Catherine Callahan is a 63 y.o. female with paroxysmal atrial fibrillation, flecainide therapy, obesity, essential hypertension, and obstructive sleep apnea. CHADS VASC = 2.  ***  The patient {does/does not:200015} have symptoms concerning for COVID-19 infection (fever, chills, cough, or new shortness of breath).    Past Medical History:  Diagnosis Date  . Arthritis   . Atrial flutter ([place]) 01/05/2013  . Encounter for long-term (current) use of other medications 01/11/2011  . Hypertension, essential, benign 01/11/2011   pt denies this  . OSA on CPAP 01/05/2013  . Overweight   . Paroxysmal atrial fibrillation (HCC)   . Ulcerative colitis [facility])    Past Surgical History:  Procedure Laterality Date  . COLON SURGERY  2004   colon resection     No outpatient medications have been marked as taking for the 07/09/18 encounter (Appointment) with Belva Crome, MD.     Allergies:   Penicillin g; Penicillins; Meloxicam; and Latex   Social History   Tobacco Use  . Smoking status: Never Smoker  . Smokeless tobacco: Never Used  Substance Use Topics  . Alcohol use: No  . Drug use: No     Family Hx: The patient's family history includes Alzheimer's disease in her mother; Atrial fibrillation in her father; CAD (age of onset: 50) in her brother; Healthy in her brother and sister; Heart disease in her brother and mother; Other in her brother; Stroke in her father.  ROS:   Please see the history of present illness.    *** All other  systems reviewed and are negative.   Prior CV studies:   The following studies were reviewed today:  ***  Labs/Other Tests and Data Reviewed:    EKG:  {EKG/Telemetry Strips Reviewed:[phone]}  Recent Labs: No results found for requested labs within last 8760 hours.   Recent Lipid Panel Lab Results  Component Value Date/Time   CHOL (H) 04/10/2010 03:40 AM    204        ATP III CLASSIFICATION:  <200     mg/dL   Desirable  200-239  mg/dL   Borderline High  >=240    mg/dL   High          TRIG 385 (H) 04/10/2010 03:40 AM   HDL 62 04/10/2010 03:40 AM   CHOLHDL 3.3 04/10/2010 03:40 AM   LDLCALC  04/10/2010 03:40 AM    65        Total Cholesterol/HDL:CHD Risk Coronary Heart Disease Risk Table                     Men   Women  1/2 Average Risk   3.4   3.3  Average Risk       5.0   4.4  2 X Average Risk   9.6   7.1  3 X Average Risk  23.4   11.0        Use the calculated Patient Ratio above and the CHD Risk Table to determine the patient's CHD Risk.        ATP III CLASSIFICATION (LDL):  <  100     mg/dL   Optimal  100-129  mg/dL   Near or Above                    Optimal  130-159  mg/dL   Borderline  160-189  mg/dL   High  >190     mg/dL   Very High    Wt Readings from Last 3 Encounters:  06/03/18 230 lb (104.3 kg)  04/23/17 245 lb (111.1 kg)  04/08/17 253 lb (114.8 kg)     Objective:    Vital Signs:  There were no vitals taken for this visit.   {HeartCare Virtual Exam (Optional):[phone]::"VITAL SIGNS:  reviewed"}  ASSESSMENT & PLAN:    1. Paroxysmal atrial fibrillation (HCC)   2. Essential hypertension   3. OSA on CPAP   4. Encounter for monitoring flecainide therapy   5. 2019 novel coronavirus disease (COVID-19)    PLAN:  1. ***  COVID-19 Education: The signs and symptoms of COVID-19 were discussed with the patient and how to seek care for testing (follow up with PCP or arrange E-visit).  ***The importance of social distancing was discussed  today.  Time:   Today, I have spent *** minutes with the patient with telehealth technology discussing the above problems.     Medication Adjustments/Labs and Tests Ordered: Current medicines are reviewed at length with the patient today.  Concerns regarding medicines are outlined above.   Tests Ordered: No orders of the defined types were placed in this encounter.   Medication Changes: No orders of the defined types were placed in this encounter.   Disposition:  Follow up {follow up:15908}  Signed, Sinclair Grooms, MD  07/08/2018 8:29 PM    [facility]

## 2018-07-08 NOTE — Telephone Encounter (Signed)
LM for pt to call back. Dr Smith has a meeting in the morning so I need to move her appt from 5/6 to 5/12. If pt calls back, please move her to one of the open 30 minute slots on 5/12. 

## 2018-07-09 ENCOUNTER — Telehealth: Payer: BLUE CROSS/BLUE SHIELD | Admitting: Interventional Cardiology

## 2018-07-09 ENCOUNTER — Encounter

## 2018-07-09 ENCOUNTER — Other Ambulatory Visit: Payer: Self-pay

## 2018-07-09 NOTE — Telephone Encounter (Signed)
I spoke to pt. And moved her appt to 5/12 at 10:00.

## 2018-07-14 NOTE — Progress Notes (Signed)
Virtual Visit via Video Note   This visit type was conducted due to national recommendations for restrictions regarding the COVID-19 Pandemic (e.g. social distancing) in an effort to limit this patient's exposure and mitigate transmission in our community.  Due to her co-morbid illnesses, this patient is at least at moderate risk for complications without adequate follow up.  This format is felt to be most appropriate for this patient at this time.  All issues noted in this document were discussed and addressed.  A limited physical exam was performed with this format.  Please refer to the patient's chart for her consent to telehealth for [facility].   Date:  07/15/2018   ID:  Catherine Callahan, DOB [birth year], MRN [id number]  Patient Location: Home Provider Location: Office  PCP:  Carol Ada, MD  Cardiologist:  Sinclair Grooms, MD  Electrophysiologist:  None   Evaluation Performed:  Follow-Up Visit  Chief Complaint:  PA Flutter and fib  History of Present Illness:    Catherine Callahan is a 63 y.o. female with paroxysmal atrial fibrillation, flecainide therapy, obesity, essential hypertension, and obstructive sleep apnea.  [name] continues to have episodes of irregular heartbeat from time to time.  Last recognized episode was 2 months ago.  She continues on flecainide for prevention.  She has not had syncope.  She has gained weight.  She is compliant with CPAP.  She denies lower extremity swelling.  No neurological complaints.  CHADS VASC score = 2+ (hypertension, female, obstructive sleep apnea)  The patient does not have symptoms concerning for COVID-19 infection (fever, chills, cough, or new shortness of breath).    Past Medical History:  Diagnosis Date  . Arthritis   . Atrial flutter ([place]) 01/05/2013  . Encounter for long-term (current) use of other medications 01/11/2011  . Hypertension, essential, benign 01/11/2011   pt denies this  . OSA on CPAP 01/05/2013  .  Overweight   . Paroxysmal atrial fibrillation (HCC)   . Ulcerative colitis [facility])    Past Surgical History:  Procedure Laterality Date  . COLON SURGERY  2004   colon resection     Current Meds  Medication Sig  . aspirin 81 MG tablet Take 81 mg by mouth daily.  . flecainide (TAMBOCOR) 150 MG tablet Take 1 tablet (150 mg total) by mouth 2 (two) times daily.  . Mesalamine (ASACOL HD) 800 MG TBEC Take 800 mg by mouth daily.   . metoprolol succinate (TOPROL-XL) 25 MG 24 hr tablet Take 3 tablets (75 mg total) by mouth 2 (two) times daily. Take with or immediately following a meal.  . Multiple Vitamin (MULTI-VITAMINS) TABS Take 1 capsule by mouth daily.   . pravastatin (PRAVACHOL) 40 MG tablet Take 40 mg by mouth daily.  . [DISCONTINUED] flecainide (TAMBOCOR) 150 MG tablet Take 1 tablet (150 mg total) by mouth 2 (two) times daily. Please keep upcoming appt in February with Dr. Tamala Julian before anymore refills. Thank you  . [DISCONTINUED] metoprolol succinate (TOPROL-XL) 25 MG 24 hr tablet Take 3 tablets (75 mg total) by mouth 2 (two) times daily. Take with or immediately following a meal.     Allergies:   Penicillin g; Penicillins; Meloxicam; and Latex   Social History   Tobacco Use  . Smoking status: Never Smoker  . Smokeless tobacco: Never Used  Substance Use Topics  . Alcohol use: No  . Drug use: No     Family Hx: The patient's family history includes Alzheimer's disease in  her mother; Atrial fibrillation in her father; CAD (age of onset: 17) in her brother; Healthy in her brother and sister; Heart disease in her brother and mother; Other in her brother; Stroke in her father.  ROS:   Please see the history of present illness.    Dizziness occurs when she turns quickly as in trying to back up her automobile.  It goes away quickly with reassuming her typical position.  She is worried about her carotids. All other systems reviewed and are negative.   Prior CV studies:   The following  studies were reviewed today:  No recent cardiovascular studies.  Labs/Other Tests and Data Reviewed:    EKG:  No ECG reviewed.  Recent Labs: No results found for requested labs within last 8760 hours.   Recent Lipid Panel Lab Results  Component Value Date/Time   CHOL (H) 04/10/2010 03:40 AM    204        ATP III CLASSIFICATION:  <200     mg/dL   Desirable  200-239  mg/dL   Borderline High  >=240    mg/dL   High          TRIG 385 (H) 04/10/2010 03:40 AM   HDL 62 04/10/2010 03:40 AM   CHOLHDL 3.3 04/10/2010 03:40 AM   LDLCALC  04/10/2010 03:40 AM    65        Total Cholesterol/HDL:CHD Risk Coronary Heart Disease Risk Table                     Men   Women  1/2 Average Risk   3.4   3.3  Average Risk       5.0   4.4  2 X Average Risk   9.6   7.1  3 X Average Risk  23.4   11.0        Use the calculated Patient Ratio above and the CHD Risk Table to determine the patient's CHD Risk.        ATP III CLASSIFICATION (LDL):  <100     mg/dL   Optimal  100-129  mg/dL   Near or Above                    Optimal  130-159  mg/dL   Borderline  160-189  mg/dL   High  >190     mg/dL   Very High    Wt Readings from Last 3 Encounters:  07/15/18 235 lb (106.6 kg)  06/03/18 230 lb (104.3 kg)  04/23/17 245 lb (111.1 kg)     Objective:    Vital Signs:  BP (!) 148/84   Pulse 64   Temp 97.8 F (36.6 C)   Ht 5\' 5"  (1.651 m)   Wt 235 lb (106.6 kg)   BMI 39.11 kg/m    VITAL SIGNS:  reviewed GEN:  no acute distress RESPIRATORY:  normal respiratory effort, symmetric expansion CARDIOVASCULAR:  no peripheral edema NEURO:  alert and oriented x 3, no obvious focal deficit  ASSESSMENT & PLAN:    1. Paroxysmal atrial fibrillation (HCC)   2. Essential hypertension   3. OSA on CPAP   4. Obesity (BMI 30-39.9)   5. Encounter for monitoring flecainide therapy   6. Dizziness   7. 2019 novel coronavirus disease (COVID-19)     PLAN:  1. She is compliant with CPAP and flecainide use.   Plan to start anticoagulation with Eliquis given obesity, sleep apnea, female sex, hypertension,  and age.  We will check a basic metabolic panel before starting to ensure correct dosing. 2. Poor BP control.  Start losartan 50 mg/day.  Basic metabolic panel 7 days later.  Low-salt diet, aerobic activity, decreased alcohol intake, avoidance of nonsteroidal anti-inflammatory therapy were all discussed in detail. 3. Encourage compliance with CPAP. 4. Decrease caloric intake and moderate physical activity were discussed and encouraged. 5. Continue flecainide therapy.  Because of the complaint of dizziness that has been recurrent and possibly neurologic, we will plan to do bilateral carotid Doppler study to exclude vascular disease for which she is at high risk.  COVID-19 Education: The signs and symptoms of COVID-19 were discussed with the patient and how to seek care for testing (follow up with PCP or arrange E-visit).  The importance of social distancing was discussed today.  Time:   Today, I have spent 25  minutes with the patient with telehealth technology discussing the above problems.     Medication Adjustments/Labs and Tests Ordered: Current medicines are reviewed at length with the patient today.  Concerns regarding medicines are outlined above.   Tests Ordered: Orders Placed This Encounter  Procedures  . Basic metabolic panel  . CBC    Medication Changes: Meds ordered this encounter  Medications  . flecainide (TAMBOCOR) 150 MG tablet    Sig: Take 1 tablet (150 mg total) by mouth 2 (two) times daily.    Dispense:  180 tablet    Refill:  3  . metoprolol succinate (TOPROL-XL) 25 MG 24 hr tablet    Sig: Take 3 tablets (75 mg total) by mouth 2 (two) times daily. Take with or immediately following a meal.    Dispense:  540 tablet    Refill:  3  . losartan (COZAAR) 50 MG tablet    Sig: Take 1 tablet (50 mg total) by mouth daily.    Dispense:  90 tablet    Refill:  3  . apixaban  (ELIQUIS) 5 MG TABS tablet    Sig: Take 1 tablet (5 mg total) by mouth 2 (two) times daily.    Dispense:  60 tablet    Refill:  11    Please place on hold.  Checking kidney function before prescribing to ensure correct dose.    Disposition:  Follow up in 4 month(s)  Signed, Sinclair Grooms, MD  07/15/2018 10:52 AM    [place]

## 2018-07-15 ENCOUNTER — Encounter: Payer: Self-pay | Admitting: Interventional Cardiology

## 2018-07-15 ENCOUNTER — Telehealth (INDEPENDENT_AMBULATORY_CARE_PROVIDER_SITE_OTHER): Payer: BLUE CROSS/BLUE SHIELD | Admitting: Interventional Cardiology

## 2018-07-15 ENCOUNTER — Telehealth: Payer: Self-pay | Admitting: Interventional Cardiology

## 2018-07-15 ENCOUNTER — Other Ambulatory Visit: Payer: Self-pay

## 2018-07-15 VITALS — BP 148/84 | HR 64 | Temp 97.8°F | Ht 65.0 in | Wt 235.0 lb

## 2018-07-15 DIAGNOSIS — I10 Essential (primary) hypertension: Secondary | ICD-10-CM

## 2018-07-15 DIAGNOSIS — Z9989 Dependence on other enabling machines and devices: Secondary | ICD-10-CM

## 2018-07-15 DIAGNOSIS — I480 Paroxysmal atrial fibrillation: Secondary | ICD-10-CM

## 2018-07-15 DIAGNOSIS — U071 COVID-19: Secondary | ICD-10-CM

## 2018-07-15 DIAGNOSIS — Z5181 Encounter for therapeutic drug level monitoring: Secondary | ICD-10-CM

## 2018-07-15 DIAGNOSIS — G4733 Obstructive sleep apnea (adult) (pediatric): Secondary | ICD-10-CM

## 2018-07-15 DIAGNOSIS — R42 Dizziness and giddiness: Secondary | ICD-10-CM | POA: Insufficient documentation

## 2018-07-15 DIAGNOSIS — Z79899 Other long term (current) drug therapy: Secondary | ICD-10-CM

## 2018-07-15 DIAGNOSIS — E669 Obesity, unspecified: Secondary | ICD-10-CM

## 2018-07-15 MED ORDER — APIXABAN 5 MG PO TABS: 5.0000 mg | ORAL_TABLET | Freq: Two times a day (BID) | ORAL | 11 refills | Status: DC

## 2018-07-15 MED ORDER — FLECAINIDE ACETATE 150 MG PO TABS: 150.0000 mg | ORAL_TABLET | Freq: Two times a day (BID) | ORAL | 3 refills | Status: DC

## 2018-07-15 MED ORDER — METOPROLOL SUCCINATE ER 25 MG PO TB24: 75.0000 mg | ORAL_TABLET | Freq: Two times a day (BID) | ORAL | 3 refills | Status: DC

## 2018-07-15 MED ORDER — LOSARTAN POTASSIUM 50 MG PO TABS: 50.0000 mg | ORAL_TABLET | Freq: Every day | ORAL | 3 refills | Status: DC

## 2018-07-15 NOTE — Patient Instructions (Addendum)
Medication Instructions:  1) START Losartan 50mg  once daily.  Monitor your blood pressure once daily, 2 hours after your medication, and call us with those readings in a week. 2) BASED ON THE RESULTS OF YOUR LABS, you will start Eliquis 5mg  twice daily.  DO NOT start this until we have your labs drawn next week. 3) STOP Aspirin when you start Eliquis  If you need a refill on your cardiac medications before your next appointment, please call your pharmacy.   Lab work: Your physician recommends that you return for lab work in: 1 week (BMET, CBC)  If you have labs (blood work) drawn today and your tests are completely normal, you will receive your results only by: Marland Kitchen MyChart Message (if you have MyChart) OR . A paper copy in the mail If you have any lab test that is abnormal or we need to change your treatment, we will call you to review the results.  Testing/Procedures: Your physician has requested that you have a carotid duplex. This test is an ultrasound of the carotid arteries in your neck. It looks at blood flow through these arteries that supply the brain with blood. Allow one hour for this exam. There are no restrictions or special instructions.   Follow-Up: At [facility], you and your health needs are our priority.  As part of our continuing mission to provide you with exceptional heart care, we have created designated Provider Care Teams.  These Care Teams include your primary Cardiologist (physician) and Advanced Practice Providers (APPs -  Physician Assistants and Nurse Practitioners) who all work together to provide you with the care you need, when you need it. You will need a follow up appointment in 4-6 months.  Please call our office 2 months in advance to schedule this appointment.  You may see Lesleigh Noe, MD or one of the following Advanced Practice Providers on your designated Care Team:   Norma Fredrickson, NP Nada Boozer, NP . Georgie Chard, NP  Any Other Special  Instructions Will Be Listed Below (If Applicable).

## 2018-07-15 NOTE — Telephone Encounter (Signed)
Pt will start Eliquis pending labs next week.  Cathy, CMA has placed samples and a discount card at front desk for pt.  Pt advised to pick up when she comes for labs.

## 2018-07-17 DIAGNOSIS — I480 Paroxysmal atrial fibrillation: Secondary | ICD-10-CM | POA: Diagnosis not present

## 2018-07-17 DIAGNOSIS — E782 Mixed hyperlipidemia: Secondary | ICD-10-CM | POA: Diagnosis not present

## 2018-07-17 DIAGNOSIS — Z0000 Encounter for general adult medical examination without abnormal findings: Secondary | ICD-10-CM | POA: Diagnosis not present

## 2018-07-17 DIAGNOSIS — I10 Essential (primary) hypertension: Secondary | ICD-10-CM | POA: Diagnosis not present

## 2018-07-18 ENCOUNTER — Telehealth: Payer: Self-pay | Admitting: Interventional Cardiology

## 2018-07-18 MED ORDER — LOSARTAN POTASSIUM 100 MG PO TABS: 100.0000 mg | ORAL_TABLET | Freq: Every day | ORAL | 3 refills | Status: DC

## 2018-07-18 NOTE — Telephone Encounter (Signed)
  Patient just started on the losartan (COZAAR) 50 MG tablet and she states that it is not controlling her blood pressure. She states she has been on it for four days now. Her blood pressure has ranged from 148/90 to 178/108. Pt denies any other symptoms other than low energy levels and slight headache.

## 2018-07-18 NOTE — Telephone Encounter (Signed)
Spoke with Dr. Excell Seltzer, DOD.  He said to have pt take an extra Losartan now, then increase to 100mg  once daily.  Spoke with pt and went over recommendations.  Pt verbalized understanding and was in agreement with plan.  Pt currently scheduled for CBC and BMET on Tuesday to review for possibly starting Eliquis.

## 2018-07-18 NOTE — Telephone Encounter (Signed)
Pt states since starting Losartan 50mg  on Tuesday, the lowest her BP has been is 148/90 on Wednesday morning.  Today the lowest reading has been 152/102.  Checked it at 3:30pm and it was 178/108.  Has a slight HA and fatigue but states she feels the way she always does.  Advised I will send message to Dr. Katrinka Blazing for review.

## 2018-07-19 NOTE — Telephone Encounter (Signed)
Agree 

## 2018-07-22 ENCOUNTER — Ambulatory Visit (HOSPITAL_COMMUNITY)
Admission: RE | Admit: 2018-07-22 | Discharge: 2018-07-22 | Disposition: A | Discharge status: Home / Self Care | Payer: BLUE CROSS/BLUE SHIELD | Source: Ambulatory Visit | Attending: Internal Medicine | Admitting: Internal Medicine

## 2018-07-22 ENCOUNTER — Other Ambulatory Visit: Payer: Self-pay

## 2018-07-22 ENCOUNTER — Other Ambulatory Visit: Payer: BLUE CROSS/BLUE SHIELD

## 2018-07-22 ENCOUNTER — Telehealth: Payer: Self-pay

## 2018-07-22 DIAGNOSIS — I480 Paroxysmal atrial fibrillation: Secondary | ICD-10-CM | POA: Diagnosis not present

## 2018-07-22 DIAGNOSIS — R42 Dizziness and giddiness: Secondary | ICD-10-CM | POA: Insufficient documentation

## 2018-07-22 DIAGNOSIS — E785 Hyperlipidemia, unspecified: Secondary | ICD-10-CM

## 2018-07-22 LAB — BASIC METABOLIC PANEL
BUN/Creatinine Ratio: 18 (ref 12–28)
BUN: 16 mg/dL (ref 8–27)
CO2: 24 mmol/L (ref 20–29)
Calcium: 9.7 mg/dL (ref 8.7–10.3)
Chloride: 98 mmol/L (ref 96–106)
Creatinine, Ser: 0.91 mg/dL (ref 0.57–1.00)
GFR calc Af Amer: 77 mL/min/{1.73_m2} (ref >59)
GFR calc non Af Amer: 67 mL/min/{1.73_m2} (ref >59)
Glucose: 99 mg/dL (ref 65–99)
Potassium: 4.4 mmol/L (ref 3.5–5.2)
Sodium: 137 mmol/L (ref 134–144)

## 2018-07-22 LAB — CBC
Hematocrit: 46.9 % — ABNORMAL HIGH (ref 34.0–46.6)
Hemoglobin: 16.3 g/dL — ABNORMAL HIGH (ref 11.1–15.9)
MCH: 35.6 pg — ABNORMAL HIGH (ref 26.6–33.0)
MCHC: 34.8 g/dL (ref 31.5–35.7)
MCV: 102 fL — ABNORMAL HIGH (ref 79–97)
Platelets: 194 10*3/uL (ref 150–450)
RBC: 4.58 x10E6/uL (ref 3.77–5.28)
RDW: 11.9 % (ref 11.7–15.4)
WBC: 5.8 10*3/uL (ref 3.4–10.8)

## 2018-07-22 NOTE — Telephone Encounter (Signed)
Better  

## 2018-07-22 NOTE — Telephone Encounter (Signed)
Pt dropped BP readings off at the office for Dr. Michaelle Copas review.. brought to me by the front desk..  On Losartan 50mg  and 2 hours after taking med...  07/16/18  148/90 07/17/18  160/90 07/18/18 166/96    after adding Losartan 100mg  a day 07/19/18  126/73 07/20/18   124/78 07/21/18  132/83.  Pt had picked up her Eliquis samples left for her previously..   Will forward to Dr. Katrinka Blazing for his review.. Pt had labs drawn today... bmet and cbc.

## 2018-07-23 NOTE — Telephone Encounter (Signed)
Ok to [employer] Atovasattin 20 mg daily for lipids and repeat liver and lipid in 2 months.

## 2018-07-23 NOTE — Telephone Encounter (Signed)
LMTCB

## 2018-07-23 NOTE — Telephone Encounter (Signed)
Dr. Katrinka Blazing said ok to do Atorvastatin 40mg  or Rosuvastatin 20mg  QD.

## 2018-07-23 NOTE — Telephone Encounter (Signed)
Per [facility] pt had lipids drawn at PCP January 29th.  They are as follows:  Total Chol- 245 HDL- 81 LDL- 129 Trigs- 172 ALT- 25  Will route to Dr. Katrinka Blazing for review.  You had asked for these numbers in regards to her recent Carotid doppler.  Also, ok for her to start Eliquis?

## 2018-07-23 NOTE — Telephone Encounter (Signed)
Spoke with pt and went over recommendations.  Pt mentioned she currently takes Pravastatin 40mg .  Message sent to Dr. Katrinka Blazing to inquire about dose of Atorvastatin.  Advised I will call back as soon as I hear from Dr. Katrinka Blazing.

## 2018-07-23 NOTE — Telephone Encounter (Addendum)
Pt advised and verbalized understanding of her lab and carotid U/S results. Will continue to keep track of her BP. 117/70 today.   Pt is planning to start Eliquis 07/24/18 will call her if Dr. Katrinka Blazing requests she wait secondary to her CBC results.

## 2018-07-23 NOTE — Telephone Encounter (Signed)
Follow up ° ° °Patient is returning call. Please call. °

## 2018-07-24 MED ORDER — ROSUVASTATIN CALCIUM 20 MG PO TABS: 20.0000 mg | ORAL_TABLET | Freq: Every day | ORAL | 3 refills | Status: DC

## 2018-07-24 NOTE — Telephone Encounter (Signed)
Spoke with pt and she was agreeable to start Rosuvastatin 20mg  QD.  Scheduled labs for 7/23.  Pt verbalized understanding and was in agreement with this plan.

## 2018-08-04 ENCOUNTER — Ambulatory Visit: Payer: BLUE CROSS/BLUE SHIELD | Admitting: Interventional Cardiology

## 2018-09-23 DIAGNOSIS — G4733 Obstructive sleep apnea (adult) (pediatric): Secondary | ICD-10-CM | POA: Diagnosis not present

## 2018-09-25 ENCOUNTER — Other Ambulatory Visit: Payer: BLUE CROSS/BLUE SHIELD

## 2018-09-26 ENCOUNTER — Other Ambulatory Visit: Payer: BC Managed Care – PPO

## 2018-09-29 ENCOUNTER — Other Ambulatory Visit: Payer: Self-pay

## 2018-09-29 ENCOUNTER — Encounter (INDEPENDENT_AMBULATORY_CARE_PROVIDER_SITE_OTHER): Payer: Self-pay

## 2018-09-29 ENCOUNTER — Other Ambulatory Visit: Payer: BC Managed Care – PPO | Admitting: *Deleted

## 2018-09-29 DIAGNOSIS — E785 Hyperlipidemia, unspecified: Secondary | ICD-10-CM

## 2018-09-29 LAB — HEPATIC FUNCTION PANEL
ALT: 30 IU/L (ref 0–32)
AST: 46 IU/L — ABNORMAL HIGH (ref 0–40)
Albumin: 3.9 g/dL (ref 3.8–4.8)
Alkaline Phosphatase: 95 IU/L (ref 39–117)
Bilirubin Total: 0.4 mg/dL (ref 0.0–1.2)
Bilirubin, Direct: 0.18 mg/dL (ref 0.00–0.40)
Total Protein: 6.2 g/dL (ref 6.0–8.5)

## 2018-09-29 LAB — LIPID PANEL
Chol/HDL Ratio: 3 ratio (ref 0.0–4.4)
Cholesterol, Total: 242 mg/dL — ABNORMAL HIGH (ref 100–199)
HDL: 80 mg/dL (ref >39)
LDL Calculated: 131 mg/dL — ABNORMAL HIGH (ref 0–99)
Triglycerides: 153 mg/dL — ABNORMAL HIGH (ref 0–149)
VLDL Cholesterol Cal: 31 mg/dL (ref 5–40)

## 2018-09-30 ENCOUNTER — Telehealth: Payer: Self-pay | Admitting: *Deleted

## 2018-09-30 DIAGNOSIS — E785 Hyperlipidemia, unspecified: Secondary | ICD-10-CM

## 2018-09-30 IMAGING — MG DIGITAL SCREENING BILATERAL MAMMOGRAM WITH TOMO AND CAD
8 series · 8 of 24 positions shown · non-contrast
Comparison: Previous exam(s).

CLINICAL DATA: Screening.

EXAM:
DIGITAL SCREENING BILATERAL MAMMOGRAM WITH TOMO AND CAD

[R MLO synth-2D]
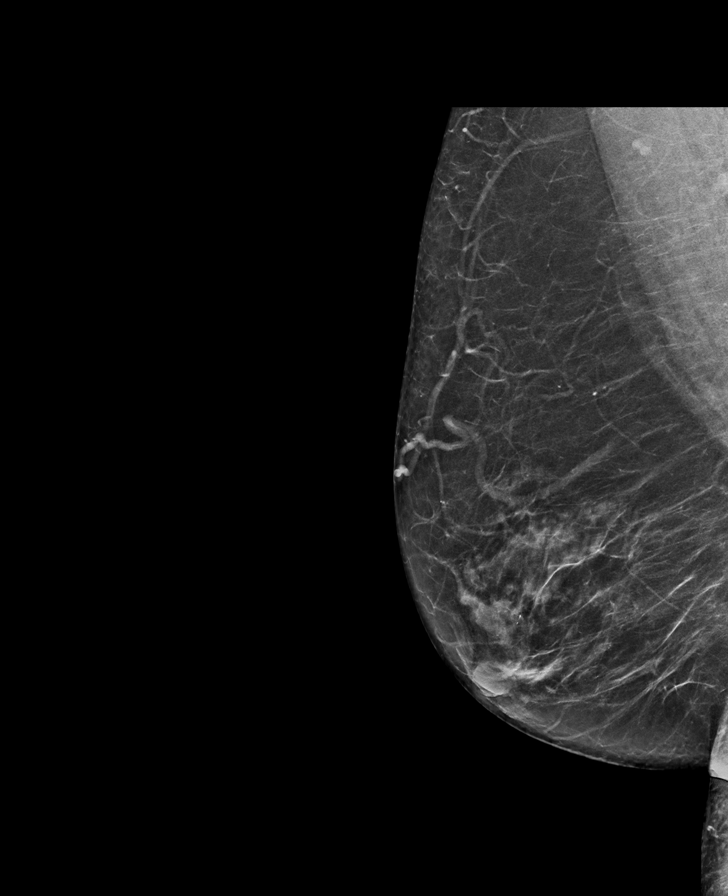

[L MLO synth-2D]
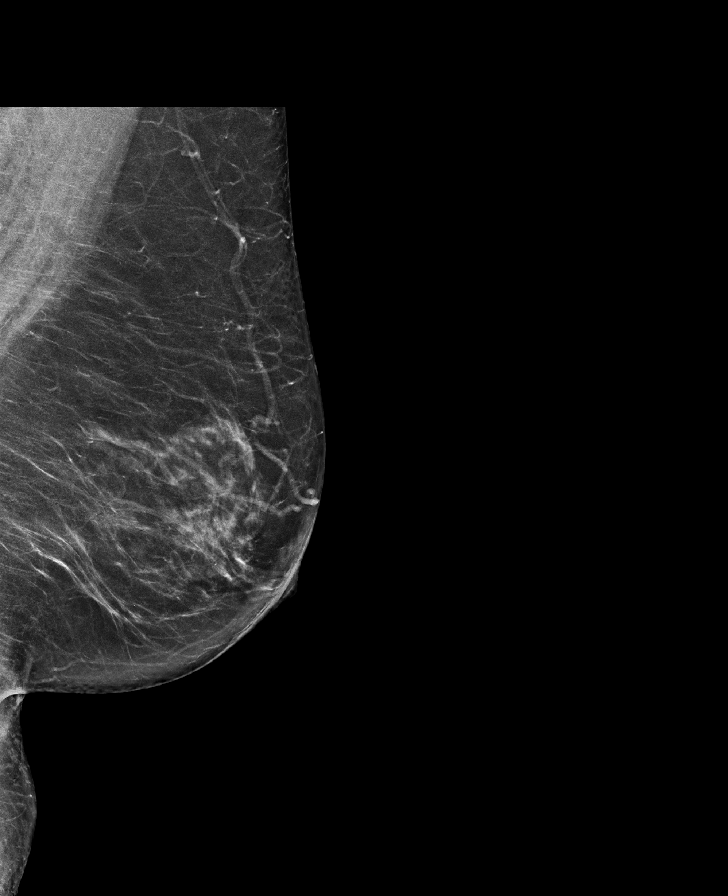

[R CC synth-2D]
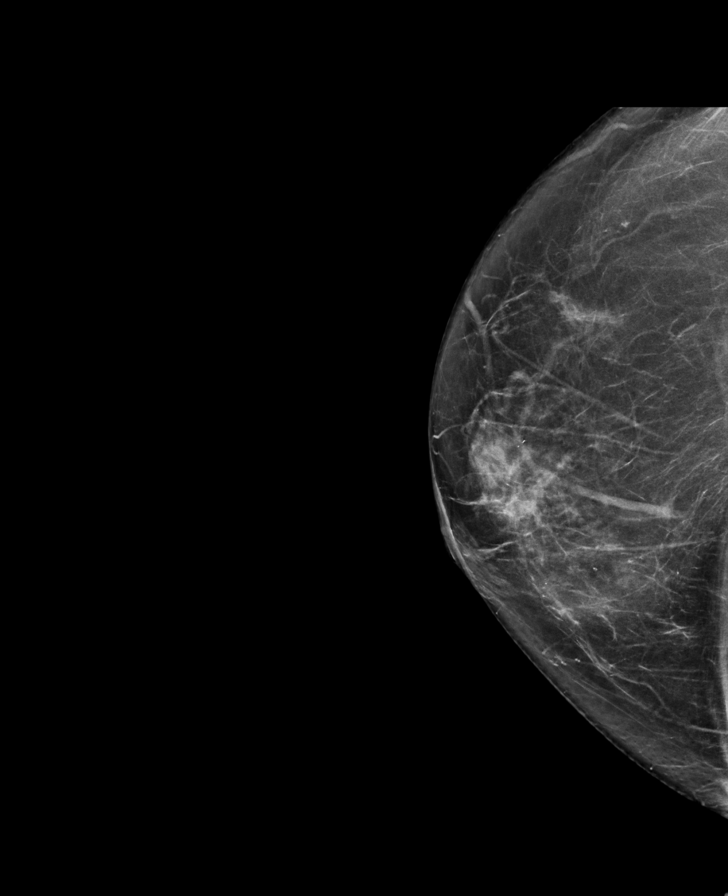

[L CC synth-2D]
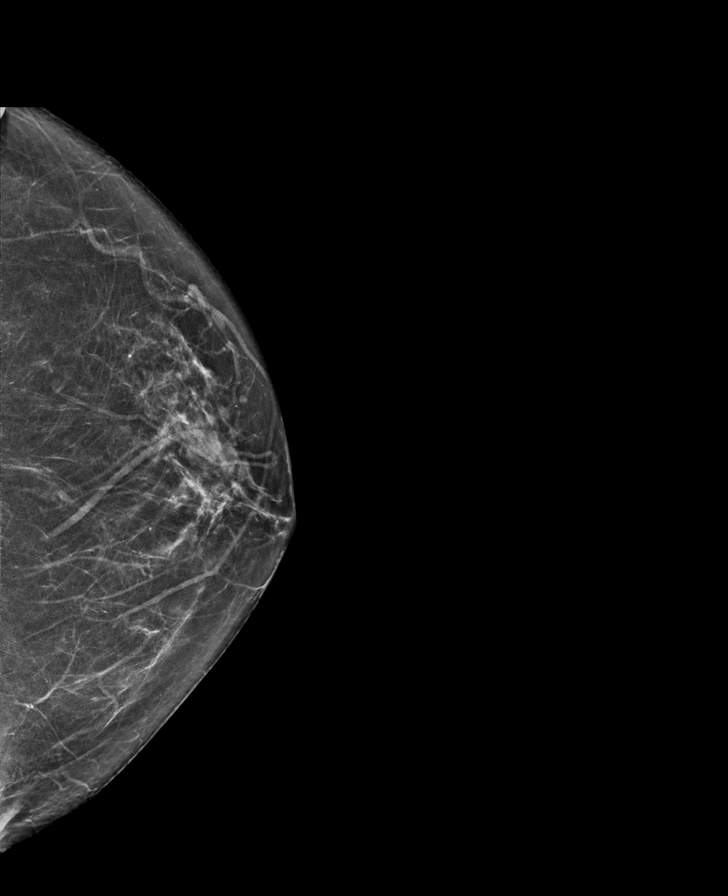

[R CC tomo · tomo slice 35/69.0]
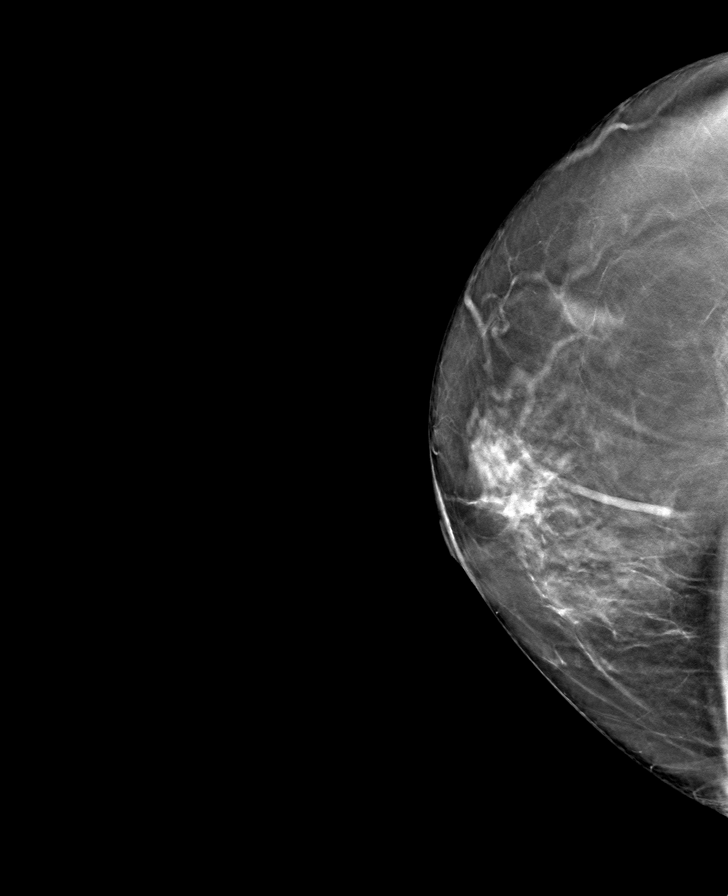

[R MLO tomo · tomo slice 37/73.0]
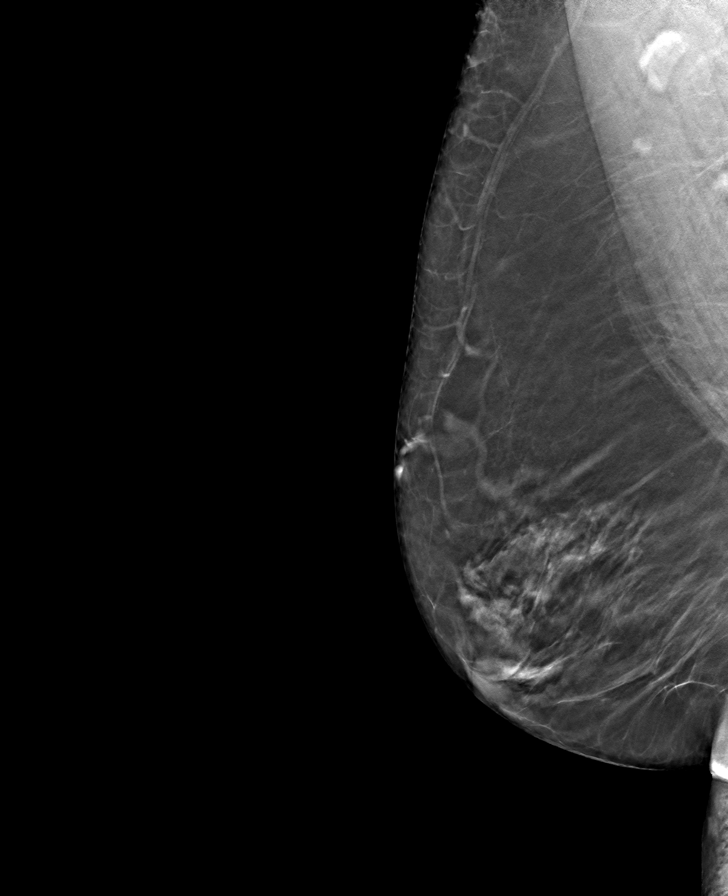

[L CC tomo · tomo slice 30/59.0]
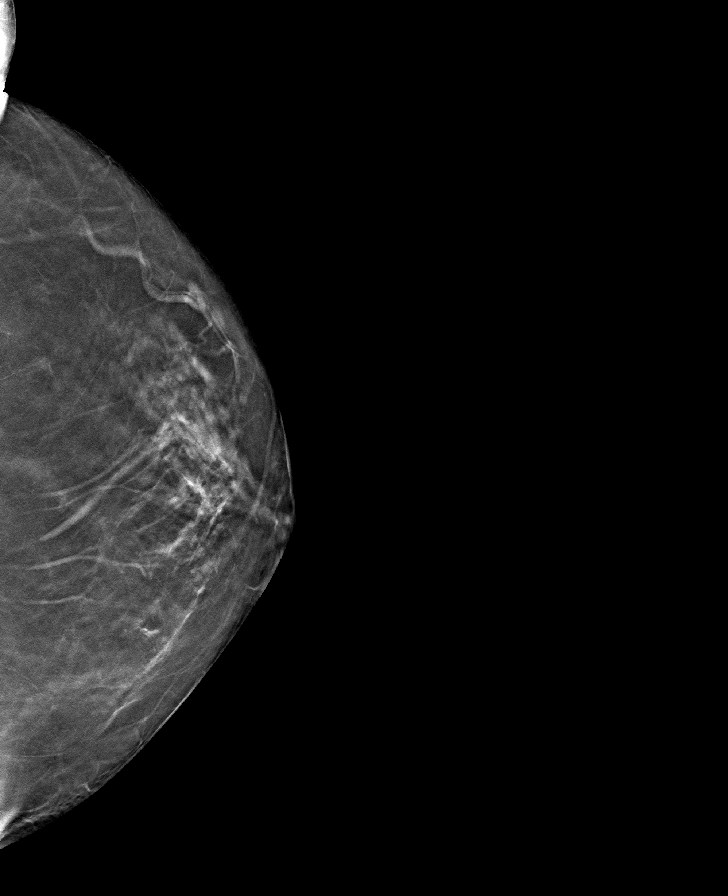

[L MLO tomo · tomo slice 36/71.0]
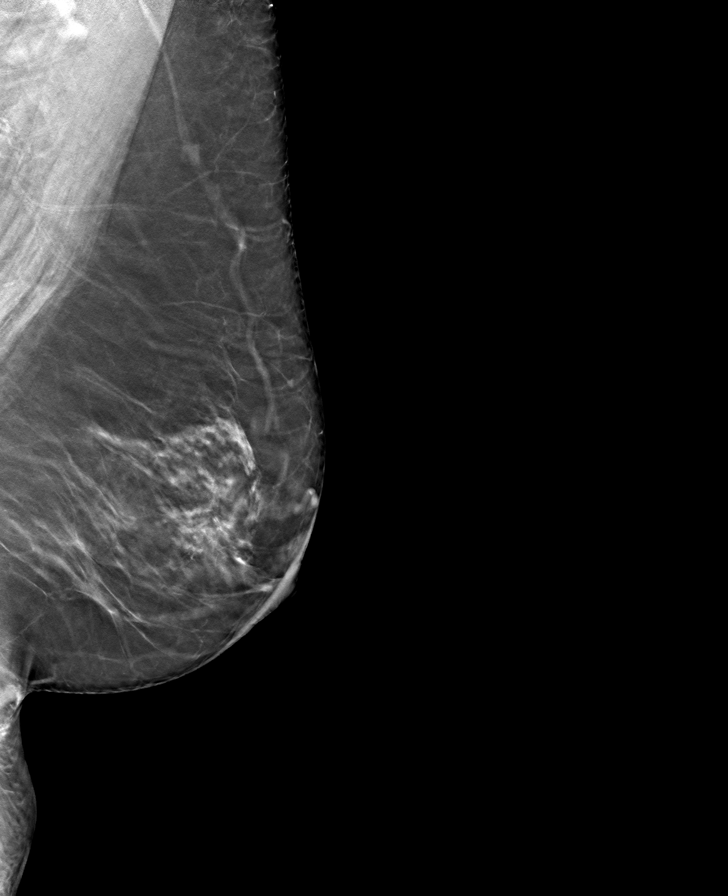

[8 of 24 positions shown; findings below may reference images not displayed]

ACR Breast Density Category b: There are scattered areas of
fibroglandular density.
FINDINGS: There are no findings suspicious for malignancy. Images were
processed with CAD.
IMPRESSION: No mammographic evidence of malignancy. A result letter of this
screening mammogram will be mailed directly to the patient.

RECOMMENDATION:
Screening mammogram in one year. (Code:[id number])

BI-RADS CATEGORY  1: Negative.

## 2018-09-30 NOTE — Telephone Encounter (Signed)
Spoke with pt and went over Dr. Thompson Caul recommendation.  Pt agreeable with plan.

## 2018-09-30 NOTE — Telephone Encounter (Signed)
-----   Message from Belva Crome, MD sent at 09/30/2018  3:40 PM EDT ----- Start Rosuvastatin 20 mg daily and recheck liver and lipid 6-8 weeks

## 2018-10-14 ENCOUNTER — Other Ambulatory Visit: Payer: Self-pay | Admitting: Family Medicine

## 2018-10-14 DIAGNOSIS — Z1231 Encounter for screening mammogram for malignant neoplasm of breast: Secondary | ICD-10-CM

## 2018-11-11 NOTE — Progress Notes (Signed)
Cardiology Office Note:    Date:  11/12/2018   ID:  Catherine Callahan, DOB 1956-02-14, MRN [id number]  PCP:  Carol Ada, MD  Cardiologist:  Sinclair Grooms, MD   Referring MD: Carol Ada, MD   Chief Complaint  Patient presents with  . Atrial Fibrillation  . Hypertension    History of Present Illness:    Catherine Callahan is a 63 y.o. female with a hx of paroxysmal atrial fibrillation, flecainide therapy, obesity, essential hypertension, and obstructive sleep apnea.  Jerzey had the impression that Eliquis was once per day.  She has had some dyspnea on exertion.  No chest pain or other complaints.  She is concerned about elevated blood pressure.  She has been gaining weight during the [place] pandemic.  Past Medical History:  Diagnosis Date  . Arthritis   . Atrial flutter (Home) 01/05/2013  . Encounter for long-term (current) use of other medications 01/11/2011  . Hypertension, essential, benign 01/11/2011   pt denies this  . OSA on CPAP 01/05/2013  . Overweight   . Paroxysmal atrial fibrillation (HCC)   . Ulcerative colitis [facility])     Past Surgical History:  Procedure Laterality Date  . COLON SURGERY  2004   colon resection    Current Medications: Current Meds  Medication Sig  . apixaban (ELIQUIS) 5 MG TABS tablet Take 1 tablet (5 mg total) by mouth 2 (two) times daily.  . flecainide (TAMBOCOR) 150 MG tablet Take 1 tablet (150 mg total) by mouth 2 (two) times daily.  . Mesalamine (ASACOL HD) 800 MG TBEC Take 800 mg by mouth daily.   . metoprolol succinate (TOPROL-XL) 25 MG 24 hr tablet Take 3 tablets (75 mg total) by mouth 2 (two) times daily. Take with or immediately following a meal.  . Multiple Vitamin (MULTI-VITAMINS) TABS Take 1 capsule by mouth daily.   . rosuvastatin (CRESTOR) 20 MG tablet Take 1 tablet (20 mg total) by mouth daily.  . [DISCONTINUED] losartan (COZAAR) 100 MG tablet Take 1 tablet (100 mg total) by mouth daily.     Allergies:    Penicillin g, Penicillins, Meloxicam, and Latex   Social History   Socioeconomic History  . Marital status: Widowed    Spouse name: Not on file  . Number of children: Not on file  . Years of education: Not on file  . Highest education level: Not on file  Occupational History  . Not on file  Social Needs  . Financial resource strain: Not on file  . Food insecurity    Worry: Not on file    Inability: Not on file  . Transportation needs    Medical: Not on file    Non-medical: Not on file  Tobacco Use  . Smoking status: Never Smoker  . Smokeless tobacco: Never Used  Substance and Sexual Activity  . Alcohol use: No  . Drug use: No  . Sexual activity: Not on file  Lifestyle  . Physical activity    Days per week: Not on file    Minutes per session: Not on file  . Stress: Not on file  Relationships  . Social Herbalist on phone: Not on file    Gets together: Not on file    Attends religious service: Not on file    Active member of club or organization: Not on file    Attends meetings of clubs or organizations: Not on file    Relationship status: Not on file  Other Topics Concern  . Not on file  Social History Narrative   Pt lives in [place] alone.  Widowed.  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     Family History: The patient's family history includes Alzheimer's disease in her mother; Atrial fibrillation in her father; CAD (age of onset: 59) in her brother; Healthy in her brother and sister; Heart disease in her brother and mother; Other in her brother; Stroke in her father.  ROS:   Please see the history of present illness.    Weight gain, dyspnea on exertion, misunderstanding concerning medications.  Otherwise no complaint.  Has been trying to get walking.  All other systems reviewed and are negative.  EKGs/Labs/Other Studies Reviewed:    The following studies were reviewed today:  No new data  EKG:  EKG sinus bradycardia at 59 bpm.  First-degree AV block at 272 ms.  Leftward axis.  Recent Labs: 07/22/2018: BUN 16; Creatinine, Ser 0.91; Hemoglobin 16.3; Platelets 194; Potassium 4.4; Sodium 137 09/29/2018: ALT 30  Recent Lipid Panel    Component Value Date/Time   CHOL 242 (H) 09/29/2018 0750   TRIG 153 (H) 09/29/2018 0750   HDL 80 09/29/2018 0750   CHOLHDL 3.0 09/29/2018 0750   CHOLHDL 3.3 04/10/2010 0340   VLDL 77 (H) 04/10/2010 0340   LDLCALC 131 (H) 09/29/2018 0750    Physical Exam:    VS:  BP (!) 152/80   Pulse (!) 59   Ht 5\' 5"  (1.651 m)   Wt 243 lb 3.2 oz (110.3 kg)   SpO2 96%   BMI 40.47 kg/m     Wt Readings from Last 3 Encounters:  11/12/18 243 lb 3.2 oz (110.3 kg)  07/15/18 235 lb (106.6 kg)  06/03/18 230 lb (104.3 kg)     GEN: Obese. No acute distress HEENT: Normal NECK: No JVD. LYMPHATICS: No lymphadenopathy CARDIAC:  RRR without murmur, gallop, or edema. VASCULAR:  Normal Pulses. No bruits. RESPIRATORY:  Clear to auscultation without rales, wheezing or rhonchi  ABDOMEN: Soft, non-tender, non-distended, No pulsatile mass, MUSCULOSKELETAL: No deformity  SKIN: Warm and dry NEUROLOGIC:  Alert and oriented x 3 PSYCHIATRIC:  Normal affect   ASSESSMENT:    1. Paroxysmal atrial fibrillation (HCC)   2. Essential hypertension   3. OSA on CPAP   4. Hyperlipidemia, unspecified hyperlipidemia type   5. Encounter for monitoring flecainide therapy   6. Educated About Covid-19 Virus Infection    PLAN:    In order of problems listed above:  1. Controlled on flecainide.  No recent episodes of palpitation or clinical suggestion of atrial fib.  Chads VASC is 3. 2. Blood pressure is too high.  Weight loss, physical activity, decrease salt in diet.  Add HCTZ 12.5 mg/day, therefore Hyzaar 100/12.5 mg daily 3. Compliant with CPAP 4. Most recent lipid panel was elevated in January.  Statin therapy was started.  An up-to-date statin panel will be  obtained later this month.  No side effects related to initiation of rosuvastatin 20 mg/day. 5. EKG does not demonstrate any evidence of QRS widening or conduction abnormality related to flecainide use. 6. Social distancing, handwashing, and masking is recommended.  Target BP: <130/80 mmHg  Diet and lifestyle measures for BP control were reviewed in detail: Low sodium diet (<2.5 gm daily); alcohol restriction (<3 ounces per day); weight loss (Mediterranean); avoid non-steroidal agents; > 6 hours sleep per day; 150  min moderate exercise per week. Medical regimen will include at least 2 agents. Resistant hypertension if not controlled on 3 agents. Consider further evaluation: Sleep study to r/o OSA; Renal angiogram; Primary hyperaldonism and Pheochromocytoma w/u. After 3 agents, consider MRA (spironolactone)/ Epleronone), hydralazine, beta-blocker, and Minoxidil if not already in use due to patient profile.    Medication Adjustments/Labs and Tests Ordered: Current medicines are reviewed at length with the patient today.  Concerns regarding medicines are outlined above.  Orders Placed This Encounter  Procedures  . Basic metabolic panel  . EKG 12-Lead   Meds ordered this encounter  Medications  . losartan-hydrochlorothiazide (HYZAAR) 100-12.5 MG tablet    Sig: Take 1 tablet by mouth daily.    Dispense:  90 tablet    Refill:  3    Change in therapy.  Ok to separate if Hyzaar not in stock.    Patient Instructions  Medication Instructions:  1) DISCONTINUE plain Losartan 2) START Losartan/HCTZ 100/12.5mg  once daily  If you need a refill on your cardiac medications before your next appointment, please call your pharmacy.   Lab work: Keep lab appointment for 11/28/2018.    If you have labs (blood work) drawn today and your tests are completely normal, you will receive your results only by: Marland Kitchen MyChart Message (if you have MyChart) OR . A paper copy in the mail If you have any lab test  that is abnormal or we need to change your treatment, we will call you to review the results.  Testing/Procedures: None  Follow-Up: Your physician wants you to follow-up in: 6 months with a Nurse Practitioner on our team. You will receive a reminder letter in the mail two months in advance. If you don't receive a letter, please call our office to schedule the follow-up appointment.  At [facility], you and your health needs are our priority.  As part of our continuing mission to provide you with exceptional heart care, we have created designated Provider Care Teams.  These Care Teams include your primary Cardiologist (physician) and Advanced Practice Providers (APPs -  Physician Assistants and Nurse Practitioners) who all work together to provide you with the care you need, when you need it. You will need a follow up appointment in 12 months.  Please call our office 2 months in advance to schedule this appointment.  You may see Sinclair Grooms, MD or one of the following Advanced Practice Providers on your designated Care Team:   Truitt Merle, NP Cecilie Kicks, NP . Kathyrn Drown, NP  Any Other Special Instructions Will Be Listed Below (If Applicable).  Monitor your blood pressure 2-3 hours after Hyzaar and call us with those readings in about a week or two.  Your goal blood pressure is 130/80 or less.       Signed, Sinclair Grooms, MD  11/12/2018 9:33 AM    [place]

## 2018-11-12 ENCOUNTER — Other Ambulatory Visit: Payer: Self-pay

## 2018-11-12 ENCOUNTER — Encounter: Payer: Self-pay | Admitting: Interventional Cardiology

## 2018-11-12 ENCOUNTER — Encounter (INDEPENDENT_AMBULATORY_CARE_PROVIDER_SITE_OTHER): Payer: Self-pay

## 2018-11-12 ENCOUNTER — Ambulatory Visit: Payer: BC Managed Care – PPO | Admitting: Interventional Cardiology

## 2018-11-12 VITALS — BP 152/80 | HR 59 | Ht 65.0 in | Wt 243.2 lb

## 2018-11-12 DIAGNOSIS — I10 Essential (primary) hypertension: Secondary | ICD-10-CM | POA: Diagnosis not present

## 2018-11-12 DIAGNOSIS — E785 Hyperlipidemia, unspecified: Secondary | ICD-10-CM

## 2018-11-12 DIAGNOSIS — I480 Paroxysmal atrial fibrillation: Secondary | ICD-10-CM

## 2018-11-12 DIAGNOSIS — G4733 Obstructive sleep apnea (adult) (pediatric): Secondary | ICD-10-CM | POA: Diagnosis not present

## 2018-11-12 DIAGNOSIS — Z5181 Encounter for therapeutic drug level monitoring: Secondary | ICD-10-CM

## 2018-11-12 DIAGNOSIS — Z79899 Other long term (current) drug therapy: Secondary | ICD-10-CM

## 2018-11-12 DIAGNOSIS — Z9989 Dependence on other enabling machines and devices: Secondary | ICD-10-CM

## 2018-11-12 DIAGNOSIS — Z7189 Other specified counseling: Secondary | ICD-10-CM

## 2018-11-12 MED ORDER — LOSARTAN POTASSIUM-HCTZ 100-12.5 MG PO TABS: 1.0000 | ORAL_TABLET | Freq: Every day | ORAL | 3 refills | Status: DC

## 2018-11-12 NOTE — Patient Instructions (Signed)
Medication Instructions:  1) DISCONTINUE plain Losartan 2) START Losartan/HCTZ 100/12.5mg  once daily  If you need a refill on your cardiac medications before your next appointment, please call your pharmacy.   Lab work: Keep lab appointment for 11/28/2018.    If you have labs (blood work) drawn today and your tests are completely normal, you will receive your results only by: Marland Kitchen MyChart Message (if you have MyChart) OR . A paper copy in the mail If you have any lab test that is abnormal or we need to change your treatment, we will call you to review the results.  Testing/Procedures: None  Follow-Up: Your physician wants you to follow-up in: 6 months with a Nurse Practitioner on our team. You will receive a reminder letter in the mail two months in advance. If you don't receive a letter, please call our office to schedule the follow-up appointment.  At [facility], you and your health needs are our priority.  As part of our continuing mission to provide you with exceptional heart care, we have created designated Provider Care Teams.  These Care Teams include your primary Cardiologist (physician) and Advanced Practice Providers (APPs -  Physician Assistants and Nurse Practitioners) who all work together to provide you with the care you need, when you need it. You will need a follow up appointment in 12 months.  Please call our office 2 months in advance to schedule this appointment.  You may see Sinclair Grooms, MD or one of the following Advanced Practice Providers on your designated Care Team:   Truitt Merle, NP Cecilie Kicks, NP . Kathyrn Drown, NP  Any Other Special Instructions Will Be Listed Below (If Applicable).  Monitor your blood pressure 2-3 hours after Hyzaar and call us with those readings in about a week or two.  Your goal blood pressure is 130/80 or less.

## 2018-11-28 ENCOUNTER — Other Ambulatory Visit: Payer: BC Managed Care – PPO | Admitting: *Deleted

## 2018-11-28 ENCOUNTER — Ambulatory Visit
Admission: RE | Admit: 2018-11-28 | Discharge: 2018-11-28 | Disposition: A | Discharge status: Home / Self Care | Payer: BC Managed Care – PPO | Source: Ambulatory Visit | Attending: Family Medicine | Admitting: Family Medicine

## 2018-11-28 ENCOUNTER — Other Ambulatory Visit: Payer: Self-pay

## 2018-11-28 DIAGNOSIS — E785 Hyperlipidemia, unspecified: Secondary | ICD-10-CM | POA: Diagnosis not present

## 2018-11-28 DIAGNOSIS — Z1231 Encounter for screening mammogram for malignant neoplasm of breast: Secondary | ICD-10-CM

## 2018-11-28 DIAGNOSIS — I10 Essential (primary) hypertension: Secondary | ICD-10-CM

## 2018-11-28 LAB — LIPID PANEL
Chol/HDL Ratio: 2 ratio (ref 0.0–4.4)
Cholesterol, Total: 181 mg/dL (ref 100–199)
HDL: 91 mg/dL (ref >39)
LDL Chol Calc (NIH): 64 mg/dL (ref 0–99)
Triglycerides: 159 mg/dL — ABNORMAL HIGH (ref 0–149)
VLDL Cholesterol Cal: 26 mg/dL (ref 5–40)

## 2018-11-28 LAB — BASIC METABOLIC PANEL
BUN/Creatinine Ratio: 13 (ref 12–28)
BUN: 11 mg/dL (ref 8–27)
CO2: 24 mmol/L (ref 20–29)
Calcium: 10.2 mg/dL (ref 8.7–10.3)
Chloride: 97 mmol/L (ref 96–106)
Creatinine, Ser: 0.85 mg/dL (ref 0.57–1.00)
GFR calc Af Amer: 84 mL/min/{1.73_m2} (ref >59)
GFR calc non Af Amer: 73 mL/min/{1.73_m2} (ref >59)
Glucose: 84 mg/dL (ref 65–99)
Potassium: 3.9 mmol/L (ref 3.5–5.2)
Sodium: 138 mmol/L (ref 134–144)

## 2018-11-28 LAB — HEPATIC FUNCTION PANEL
ALT: 38 IU/L — ABNORMAL HIGH (ref 0–32)
AST: 57 IU/L — ABNORMAL HIGH (ref 0–40)
Albumin: 4.5 g/dL (ref 3.8–4.8)
Alkaline Phosphatase: 98 IU/L (ref 39–117)
Bilirubin Total: 0.5 mg/dL (ref 0.0–1.2)
Bilirubin, Direct: 0.22 mg/dL (ref 0.00–0.40)
Total Protein: 6.6 g/dL (ref 6.0–8.5)

## 2018-12-01 ENCOUNTER — Telehealth: Payer: Self-pay | Admitting: Interventional Cardiology

## 2018-12-01 DIAGNOSIS — E785 Hyperlipidemia, unspecified: Secondary | ICD-10-CM

## 2018-12-01 NOTE — Telephone Encounter (Signed)
Pt has been notified of lab results by phone with verbal understanding. Pt is agreeable to repeat labs. LFT to be done 12/29/18. As far as repeat Fasting Lipid Panel to be done in 6 months, pt is to f/u in 6 months with APP. I advised the pt let's not schedule the 6 month lab work until she has been scheduled for her 6 month f/u with APP and have labs done day hopefully. Pt is aware to continue on current Tx plan. I will forward results to PCP Dr. Archana Eckman Ada. I will release results to [place] for pt's review. Pt thanked me for the call. Patient notified of result.  Please refer to phone note from today for complete details.   Julaine Hua, [facility] 12/01/2018 2:49 PM

## 2018-12-01 NOTE — Telephone Encounter (Signed)
New Message    Patient returning your phone call for results.

## 2018-12-29 ENCOUNTER — Other Ambulatory Visit: Payer: BC Managed Care – PPO | Admitting: *Deleted

## 2018-12-29 ENCOUNTER — Other Ambulatory Visit: Payer: Self-pay

## 2018-12-29 DIAGNOSIS — E785 Hyperlipidemia, unspecified: Secondary | ICD-10-CM | POA: Diagnosis not present

## 2018-12-29 LAB — HEPATIC FUNCTION PANEL
ALT: 18 IU/L (ref 0–32)
AST: 21 IU/L (ref 0–40)
Albumin: 4 g/dL (ref 3.8–4.8)
Alkaline Phosphatase: 94 IU/L (ref 39–117)
Bilirubin Total: 0.5 mg/dL (ref 0.0–1.2)
Bilirubin, Direct: 0.16 mg/dL (ref 0.00–0.40)
Total Protein: 6.2 g/dL (ref 6.0–8.5)

## 2019-03-30 ENCOUNTER — Other Ambulatory Visit: Payer: Self-pay | Admitting: Pharmacist

## 2019-03-30 MED ORDER — APIXABAN 5 MG PO TABS: 5.0000 mg | ORAL_TABLET | Freq: Two times a day (BID) | ORAL | 1 refills | Status: DC

## 2019-05-03 NOTE — Progress Notes (Signed)
Cardiology Office Note   Date:  05/11/2019   ID:  Catherine Callahan, DOB September 01, 1955, MRN [id number]  PCP:  Catherine Ada, MD  Cardiologist: Dr. Tamala Julian, MD  Chief Complaint  Catherine Callahan presents with   Follow-up    History of Present Illness: Catherine Callahan is a 64 y.o. female who presents for 91-month follow-up, seen for Dr. Tamala Callahan.  Catherine Callahan has a history of paroxysmal atrial fibrillation on flecainide therapy and anticoagulation with Eliquis, obesity, essential hypertension and obstructive sleep apnea.  She last saw Dr. Tamala Callahan 11/12/2018 in follow-up and was doing well from a CV standpoint although blood pressure was noted to be elevated.  HCTZ 12.5 mg (Hyzaar 100/12.5) was added to her regimen.  LDL was noted to be elevated therefore statin was started as well.  EKG had no evidence of QRS widening or conduction abnormalities.  Per chart review, Catherine Callahan called the office 02/29/2020 with complaints of nosebleed felt to be secondary to dry mucous membranes from CPAP as well as Eliquis therapy.  Today Catherine Callahan reports that she is doing relatively well from a CV standpoint.  Over the last 2 weeks however she has not had more frequent breakthroughs of her AF for which she has had to take an extra 25 mg of metoprolol.  Episodes typically only happen at night and resolve with beta-blocker therapy.  Does report she recently has been trying a keto diet and feels this is precipitated the episodes.  We discussed medication changes however will continue to monitor frequency for now.  She is to let us know if episodes continue or increase and we will discuss medication changes at that time.  Has also been having infrequent, 1-2 tissue nosebleeds which typically resolve on their own.  Did have 1 isolated episode several weeks ago which was harder to control with no recurrent symptoms.  Does wear CPAP at night therefore recommended water-soluble mucous membrane lubricant.  Also reports she has  history of allergies during times of weather change therefore she could also try OTC Zyrtec, Claritin to see if symptoms resolve.  She denies chest pain, shortness of breath, LE edema, PND, dizziness or syncope.  Continues to practice massage therapy.  Is looking forward to receiving her first Covid vaccine.   Past Medical History:  Diagnosis Date   Arthritis    Atrial flutter ([place]) 01/05/2013   Encounter for long-term (current) use of other medications 01/11/2011   Hypertension, essential, benign 01/11/2011   pt denies this   OSA on CPAP 01/05/2013   Overweight    Paroxysmal atrial fibrillation (HCC)    Ulcerative colitis ([place])     Past Surgical History:  Procedure Laterality Date   COLON SURGERY  2004   colon resection     Current Outpatient Medications  Medication Sig Dispense Refill   apixaban (ELIQUIS) 5 MG TABS tablet Take 1 tablet (5 mg total) by mouth 2 (two) times daily. 180 tablet 1   flecainide (TAMBOCOR) 150 MG tablet Take 1 tablet (150 mg total) by mouth 2 (two) times daily. 180 tablet 3   losartan-hydrochlorothiazide (HYZAAR) 100-12.5 MG tablet Take 1 tablet by mouth daily. 90 tablet 3   Mesalamine (ASACOL HD) 800 MG TBEC Take 800 mg by mouth daily.      metoprolol succinate (TOPROL-XL) 25 MG 24 hr tablet Take 3 tablets (75 mg total) by mouth 2 (two) times daily. Take with or immediately following a meal. 540 tablet 3   Multiple Vitamin (MULTI-VITAMINS)  TABS Take 1 capsule by mouth daily.      rosuvastatin (CRESTOR) 20 MG tablet Take 1 tablet (20 mg total) by mouth daily. 90 tablet 3   No current facility-administered medications for this visit.    Allergies:   Penicillin g, Penicillins, Meloxicam, and Latex    Social History:  The Catherine Callahan  reports that she has never smoked. She has never used smokeless tobacco. She reports that she does not drink alcohol or use drugs.   Family History:  The Catherine Callahan's family history includes Alzheimer's disease in  her mother; Atrial fibrillation in her father; CAD (age of onset: 48) in her brother; Healthy in her brother and sister; Heart disease in her brother and mother; Other in her brother; Stroke in her father.    ROS:  Please see the history of present illness. Otherwise, review of systems are positive for none.   All other systems are reviewed and negative.    PHYSICAL EXAM: VS:  BP 106/70    Pulse (!) 52    Ht 5\' 5"  (1.651 m)    Wt 243 lb 12.8 oz (110.6 kg)    SpO2 97%    BMI 40.57 kg/m  , BMI Body mass index is 40.57 kg/m.   General: Well developed, well nourished, NAD Neck: Negative for carotid bruits. No JVD Lungs:Clear to ausculation bilaterally. No wheezes, rales, or rhonchi. Breathing is unlabored. Cardiovascular: RRR with S1 S2. No murmurs, rubs, gallops, or LV heave appreciated. Extremities: No edema. Radial pulses 2+ bilaterally Neuro: Alert and oriented. No focal deficits. No facial asymmetry. MAE spontaneously. Psych: Responds to questions appropriately with normal affect.     EKG:  EKG is not ordered today.  Recent Labs: 07/22/2018: Hemoglobin 16.3; Platelets 194 11/28/2018: BUN 11; Creatinine, Ser 0.85; Potassium 3.9; Sodium 138 12/29/2018: ALT 18    Lipid Panel    Component Value Date/Time   CHOL 181 11/28/2018 0835   TRIG 159 (H) 11/28/2018 0835   HDL 91 11/28/2018 0835   CHOLHDL 2.0 11/28/2018 0835   CHOLHDL 3.3 04/10/2010 0340   VLDL 77 (H) 04/10/2010 0340   LDLCALC 64 11/28/2018 0835     Wt Readings from Last 3 Encounters:  05/11/19 243 lb 12.8 oz (110.6 kg)  11/12/18 243 lb 3.2 oz (110.3 kg)  07/15/18 235 lb (106.6 kg)    ASSESSMENT AND PLAN:  1.  Paroxysmal atrial fibrillation: -Maintained on flecainide and anticoagulated with Eliquis -More recent breakthrough episodes over the last 2 weeks felt to be related to recent diet change therefore will monitor for now.  Takes an extra 25 mg Toprol with episodes.  If episodes continue, Catherine Callahan is to call to  notify us for possible medication titration. -Intermittent episodes of nosebleeds felt to be related to weather change, CPAP therefore recommendations are for water-soluble mucous membrane lubricant as well as antihistamine with Zyrtec, Claritin given history of weather change allergies. -CHA2DS2VASc = 3  2.  Hypertension: -BP elevated at last office visit therefore Hyzaar 100/12.5 mg daily was added to her regimen -BP today, 106/70 -Continue current regimen  3.  Hyperlipidemia: -Last LDL, 131 09/29/2018 -Continue statin -Repeat lab work today  4.  Flecainide monitoring: -Last OV EKG with no evidence of QRS widening or conduction abnormalities   Current medicines are reviewed at length with the Catherine Callahan today.  The Catherine Callahan does not have concerns regarding medicines.  The following changes have been made:  no change  Labs/ tests ordered today include: CBC, Lipid  Orders Placed This Encounter  Procedures   CBC   Lipid panel    Disposition:   FU with Dr. Tamala Callahan in 6 months  Signed, Kathyrn Drown, NP  05/11/2019 8:44 AM    [address], [place], [place]  [address] Phone: [phone]; Fax: [phone]

## 2019-05-11 ENCOUNTER — Encounter: Payer: Self-pay | Admitting: Cardiology

## 2019-05-11 ENCOUNTER — Ambulatory Visit (INDEPENDENT_AMBULATORY_CARE_PROVIDER_SITE_OTHER): Payer: Medicare Other | Admitting: Cardiology

## 2019-05-11 ENCOUNTER — Other Ambulatory Visit: Payer: Self-pay

## 2019-05-11 VITALS — BP 106/70 | HR 52 | Ht 65.0 in | Wt 243.8 lb

## 2019-05-11 DIAGNOSIS — I10 Essential (primary) hypertension: Secondary | ICD-10-CM | POA: Diagnosis not present

## 2019-05-11 DIAGNOSIS — I480 Paroxysmal atrial fibrillation: Secondary | ICD-10-CM | POA: Diagnosis not present

## 2019-05-11 DIAGNOSIS — Z79899 Other long term (current) drug therapy: Secondary | ICD-10-CM | POA: Diagnosis not present

## 2019-05-11 DIAGNOSIS — Z5181 Encounter for therapeutic drug level monitoring: Secondary | ICD-10-CM

## 2019-05-11 DIAGNOSIS — G4733 Obstructive sleep apnea (adult) (pediatric): Secondary | ICD-10-CM

## 2019-05-11 DIAGNOSIS — Z9989 Dependence on other enabling machines and devices: Secondary | ICD-10-CM

## 2019-05-11 DIAGNOSIS — E785 Hyperlipidemia, unspecified: Secondary | ICD-10-CM | POA: Diagnosis not present

## 2019-05-11 LAB — CBC
Hematocrit: 41.9 % (ref 34.0–46.6)
Hemoglobin: 14.3 g/dL (ref 11.1–15.9)
MCH: 35.1 pg — ABNORMAL HIGH (ref 26.6–33.0)
MCHC: 34.1 g/dL (ref 31.5–35.7)
MCV: 103 fL — ABNORMAL HIGH (ref 79–97)
Platelets: 193 10*3/uL (ref 150–450)
RBC: 4.07 x10E6/uL (ref 3.77–5.28)
RDW: 12.5 % (ref 11.7–15.4)
WBC: 6.2 10*3/uL (ref 3.4–10.8)

## 2019-05-11 LAB — LIPID PANEL
Chol/HDL Ratio: 2.2 ratio (ref 0.0–4.4)
Cholesterol, Total: 157 mg/dL (ref 100–199)
HDL: 70 mg/dL (ref >39)
LDL Chol Calc (NIH): 71 mg/dL (ref 0–99)
Triglycerides: 84 mg/dL (ref 0–149)
VLDL Cholesterol Cal: 16 mg/dL (ref 5–40)

## 2019-05-11 NOTE — Patient Instructions (Signed)
Medication Instructions:   Your physician recommends that you continue on your current medications as directed. Please refer to the Current Medication list given to you today.  *If you need a refill on your cardiac medications before your next appointment, please call your pharmacy*  Lab Work:  You will have labs drawn today: CBC/Lipid panel  If you have labs (blood work) drawn today and your tests are completely normal, you will receive your results only by: Marland Kitchen MyChart Message (if you have MyChart) OR . A paper copy in the mail If you have any lab test that is abnormal or we need to change your treatment, we will call you to review the results.  Testing/Procedures:  None ordered today  Follow-Up: At [facility], you and your health needs are our priority.  As part of our continuing mission to provide you with exceptional heart care, we have created designated Provider Care Teams.  These Care Teams include your primary Cardiologist (physician) and Advanced Practice Providers (APPs -  Physician Assistants and Nurse Practitioners) who all work together to provide you with the care you need, when you need it.  We recommend signing up for the patient portal called "MyChart".  Sign up information is provided on this After Visit Summary.  MyChart is used to connect with patients for Virtual Visits (Telemedicine).  Patients are able to view lab/test results, encounter notes, upcoming appointments, etc.  Non-urgent messages can be sent to your provider as well.   To learn more about what you can do with MyChart, go to [email or web address].    Your next appointment:   6 month(s)  The format for your next appointment:   In Person  Provider:   Verdis Prime, MD

## 2019-05-16 ENCOUNTER — Other Ambulatory Visit: Payer: Self-pay

## 2019-05-16 ENCOUNTER — Ambulatory Visit: Payer: Medicare Other | Attending: Internal Medicine

## 2019-05-16 DIAGNOSIS — Z23 Encounter for immunization: Secondary | ICD-10-CM

## 2019-05-16 NOTE — Progress Notes (Signed)
   Covid-19 Vaccination Clinic  Name:  Catherine Callahan    MRN: [id number] DOB: 12/04/1955  05/16/2019  [name] was observed post Covid-19 immunization for 15 minutes without incident. She was provided with Vaccine Information Sheet and instruction to access the V-Safe system.   [name] was instructed to call 911 with any severe reactions post vaccine: Marland Kitchen Difficulty breathing  . Swelling of face and throat  . A fast heartbeat  . A bad rash all over body  . Dizziness and weakness   Immunizations Administered    Name Date Dose VIS Date Route   Pfizer COVID-19 Vaccine 05/16/2019  2:10 PM 0.3 mL 02/13/2019 Intramuscular   Manufacturer: [employer], [employer]   Lot: [id number]   NDC: [address]-9

## 2019-05-22 NOTE — Telephone Encounter (Signed)
Follow up:    Patient need to speak with some one concering being in AFIB. Patient left message in staff messages no one has reached out to the patient. Please call patient back.

## 2019-05-25 ENCOUNTER — Other Ambulatory Visit: Payer: Self-pay | Admitting: *Deleted

## 2019-05-25 DIAGNOSIS — I4891 Unspecified atrial fibrillation: Secondary | ICD-10-CM

## 2019-06-08 DIAGNOSIS — I480 Paroxysmal atrial fibrillation: Secondary | ICD-10-CM | POA: Diagnosis not present

## 2019-06-08 DIAGNOSIS — E782 Mixed hyperlipidemia: Secondary | ICD-10-CM | POA: Diagnosis not present

## 2019-06-08 DIAGNOSIS — I10 Essential (primary) hypertension: Secondary | ICD-10-CM | POA: Diagnosis not present

## 2019-06-09 ENCOUNTER — Ambulatory Visit: Payer: Medicare Other | Attending: Internal Medicine

## 2019-06-09 DIAGNOSIS — Z23 Encounter for immunization: Secondary | ICD-10-CM

## 2019-06-09 NOTE — Progress Notes (Signed)
   Covid-19 Vaccination Clinic  Name:  Catherine Callahan    MRN: [id number] DOB: 1955/09/01  06/09/2019  [name] was observed post Covid-19 immunization for 15 minutes without incident. She was provided with Vaccine Information Sheet and instruction to access the V-Safe system.   [name] was instructed to call 911 with any severe reactions post vaccine: Marland Kitchen Difficulty breathing  . Swelling of face and throat  . A fast heartbeat  . A bad rash all over body  . Dizziness and weakness   Immunizations Administered    Name Date Dose VIS Date Route   Pfizer COVID-19 Vaccine 06/09/2019  2:03 PM 0.3 mL 02/13/2019 Intramuscular   Manufacturer: [employer], [employer]   Lot: [id number]   NDC: [address]-7

## 2019-06-15 ENCOUNTER — Institutional Professional Consult (permissible substitution): Payer: BC Managed Care – PPO | Admitting: Cardiology

## 2019-06-16 ENCOUNTER — Telehealth: Payer: Self-pay

## 2019-06-17 ENCOUNTER — Other Ambulatory Visit: Payer: Self-pay

## 2019-06-17 ENCOUNTER — Telehealth: Payer: Self-pay

## 2019-06-17 ENCOUNTER — Encounter: Payer: Self-pay | Admitting: Internal Medicine

## 2019-06-17 ENCOUNTER — Telehealth (INDEPENDENT_AMBULATORY_CARE_PROVIDER_SITE_OTHER): Payer: Medicare Other | Admitting: Internal Medicine

## 2019-06-17 DIAGNOSIS — D6869 Other thrombophilia: Secondary | ICD-10-CM

## 2019-06-17 DIAGNOSIS — I4891 Unspecified atrial fibrillation: Secondary | ICD-10-CM

## 2019-06-17 DIAGNOSIS — I10 Essential (primary) hypertension: Secondary | ICD-10-CM

## 2019-06-17 DIAGNOSIS — G4733 Obstructive sleep apnea (adult) (pediatric): Secondary | ICD-10-CM

## 2019-06-17 DIAGNOSIS — I4819 Other persistent atrial fibrillation: Secondary | ICD-10-CM | POA: Diagnosis not present

## 2019-06-17 DIAGNOSIS — I483 Typical atrial flutter: Secondary | ICD-10-CM

## 2019-06-17 DIAGNOSIS — Z01810 Encounter for preprocedural cardiovascular examination: Secondary | ICD-10-CM

## 2019-06-17 NOTE — H&P (View-Only) (Signed)
Electrophysiology TeleHealth Note   Due to national recommendations of social distancing due to [place] 19, Audio/video telehealth visit is felt to be most appropriate for this patient at this time.  See MyChart message from today for patient consent regarding telehealth for [facility].   Date:  06/17/2019   ID:  DAIANNA Callahan, DOB 1955-10-27, MRN [id number]  Location: home Provider location: [address] [place] Evaluation Performed: New patient consult  PCP:  Carol Ada, MD  Cardiologist:  Sinclair Grooms, MD  Electrophysiologist:  None   Chief Complaint:  afib  History of Present Illness:    Catherine Callahan is a 64 y.o. female who presents via audio/video conferencing for a telehealth visit today.   The patient is referred for new consultation regarding afib by Dr Tamala Julian.   She reports initially having atrial flutter in 2012.  She subsequently developed atrial fibrillation as well.  She was treated with some success with flecainiide.  I saw her in 2014 (note reviewed) and offered ablation.  She declined but reports doing reasonably well since that time. She has had increasing frequency and duration of her afib recently.  Typically, she has afib about once per week, intermittently during the day.  She feels that episodes have lasted up to 8 hours.  She finds that episodes are more likely to occur at night.  She is unaware of triggers/ precipitants.  She reports SOB and fatigue with events.  + palpitations Previously had swelling which resolved with hctz. Today, she denies symptoms of chest pain, claudication, dizziness, presyncope, syncope, bleeding, or neurologic sequela. The patient is tolerating medications without difficulties and is otherwise without complaint today.   she denies symptoms of cough, fevers, chills, or new SOB worrisome for COVID 19.   Past Medical History:  Diagnosis Date  . Arthritis   . Atrial flutter ([place]) 01/05/2013  .  Hypertension, essential, benign 01/11/2011  . OSA on CPAP 01/05/2013  . Overweight   . Paroxysmal atrial fibrillation ([facility])   . Ulcerative colitis [facility])     Past Surgical History:  Procedure Laterality Date  . COLON SURGERY  2004   colon resection    Current Outpatient Medications  Medication Sig Dispense Refill  . apixaban (ELIQUIS) 5 MG TABS tablet Take 1 tablet (5 mg total) by mouth 2 (two) times daily. 180 tablet 1  . flecainide (TAMBOCOR) 150 MG tablet Take 1 tablet (150 mg total) by mouth 2 (two) times daily. 180 tablet 3  . losartan-hydrochlorothiazide (HYZAAR) 100-12.5 MG tablet Take 1 tablet by mouth daily. 90 tablet 3  . Mesalamine (ASACOL HD) 800 MG TBEC Take 800 mg by mouth daily.     . metoprolol succinate (TOPROL-XL) 25 MG 24 hr tablet Take 3 tablets (75 mg total) by mouth 2 (two) times daily. Take with or immediately following a meal. 540 tablet 3  . Multiple Vitamin (MULTI-VITAMINS) TABS Take 1 capsule by mouth daily.     . rosuvastatin (CRESTOR) 20 MG tablet Take 1 tablet (20 mg total) by mouth daily. 90 tablet 3   No current facility-administered medications for this visit.    Allergies:   Penicillin g, Penicillins, Meloxicam, and Latex   Social History:  The patient  reports that she has never smoked. She has never used smokeless tobacco. She reports that she does not drink alcohol or use drugs.   Family History:  The patient's family history includes Alzheimer's disease in her mother; Atrial  fibrillation in her father; CAD (age of onset: 40) in her brother; Healthy in her brother and sister; Heart disease in her brother and mother; Other in her brother; Stroke in her father.    ROS:  Please see the history of present illness.   All other systems are personally reviewed and negative.    Exam:    Vital Signs:  122/78, HR 58, Wt 235  Well appearing, alert and conversant, regular work of breathing,  good skin color Eyes- anicteric, neuro- grossly intact, skin-  no apparent rash or lesions or cyanosis, mouth- oral mucosa is pink   Labs/Other Tests and Data Reviewed:    Recent Labs: 11/28/2018: BUN 11; Creatinine, Ser 0.85; Potassium 3.9; Sodium 138 12/29/2018: ALT 18 05/11/2019: Hemoglobin 14.3; Platelets 193   Wt Readings from Last 3 Encounters:  05/11/19 243 lb 12.8 oz (110.6 kg)  11/12/18 243 lb 3.2 oz (110.3 kg)  07/15/18 235 lb (106.6 kg)     Other studies personally reviewed: Additional studies/ records that were reviewed today include: my prior office notes,  Dr Katrinka Blazing and his team's notes,  Prior ecg and echo  Review of the above records today demonstrates: as above  ASSESSMENT & PLAN:    1.  Paroxysmal atrial fibrillation/ atrial flutter The patient has symptomatic, recurrent paroxysmal atrial fibrillation and atrial flutter. she has failed medical therapy with flecainide.  She has first degree AV block with flecainide and I worry that her dose may ultimately need to be reduced Chads2vasc score is 3.  she is anticoagulated with eliquis . Therapeutic strategies for afib including medicine (tikosyn/ amiodarone) and ablation were discussed in detail with the patient today. Risk, benefits, and alternatives to EP study and radiofrequency ablation for afib were also discussed in detail today. These risks include but are not limited to stroke, bleeding, vascular damage, tamponade, perforation, damage to the esophagus, lungs, and other structures, pulmonary vein stenosis, worsening renal function, and death. The patient understands these risk and wishes to proceed.  We will therefore proceed with catheter ablation at the next available time.  Carto, ICE, anesthesia are requested for the procedure.  Will also obtain cardiac CT prior to the procedure to exclude LAA thrombus and further evaluate atrial anatomy.  We will also obtain 2d echo to evaluate for structural changes related to afib.  She had moderate LA enlargement in 2015.  Hopefully we can  stop or reduce flecainide post ablation given PR prolongation on ekg  The importance of lifestyle change was also discussed today  2. Obesity Lifestyle modification was discussed at length  3. OSA She is compliant with CPAP  4. HTN Stable No change required today Echo to evaluate for structural changes    Patient Risk:  after full review of this patients clinical status, I feel that they are at moderate risk at this time.   Today, I have spent 25 minutes with the patient with telehealth technology discussing afib .    SignedHillis Range MD, [facility] 06/17/2019 11:16 AM   [facility] [address] [place] [address] [phone] (office) [phone] (fax)

## 2019-06-17 NOTE — Telephone Encounter (Signed)
-----   Message from [name], MD sent at 06/17/2019 11:29 AM EDT ----- Afib ablation C/I/A  Cardiac CT  Also needs a 2D echo to evaluate her heart.  Last echo was 2015.Marland KitchenMarland Kitchen

## 2019-06-17 NOTE — Progress Notes (Signed)
   Electrophysiology TeleHealth Note   Due to national recommendations of social distancing due to COVID 19, Audio/video telehealth visit is felt to be most appropriate for this patient at this time.  See MyChart message from today for patient consent regarding telehealth for [facility].   Date:  06/17/2019   ID:  Catherine Callahan, DOB 05/12/1955, MRN [id number]  Location: home Provider location: [address], [place] Evaluation Performed: New patient consult  PCP:  Smith, Candace, MD  Cardiologist:  Henry W Smith III, MD  Electrophysiologist:  None   Chief Complaint:  afib  History of Present Illness:    Catherine Callahan is a 64 y.o. female who presents via audio/video conferencing for a telehealth visit today.   The patient is referred for new consultation regarding afib by Dr Smith.   She reports initially having atrial flutter in 2012.  She subsequently developed atrial fibrillation as well.  She was treated with some success with flecainiide.  I saw her in 2014 (note reviewed) and offered ablation.  She declined but reports doing reasonably well since that time. She has had increasing frequency and duration of her afib recently.  Typically, she has afib about once per week, intermittently during the day.  She feels that episodes have lasted up to 8 hours.  She finds that episodes are more likely to occur at night.  She is unaware of triggers/ precipitants.  She reports SOB and fatigue with events.  + palpitations Previously had swelling which resolved with hctz. Today, she denies symptoms of chest pain, claudication, dizziness, presyncope, syncope, bleeding, or neurologic sequela. The patient is tolerating medications without difficulties and is otherwise without complaint today.   she denies symptoms of cough, fevers, chills, or new SOB worrisome for COVID 19.   Past Medical History:  Diagnosis Date  . Arthritis   . Atrial flutter ([facility]) 01/05/2013  .  Hypertension, essential, benign 01/11/2011  . OSA on CPAP 01/05/2013  . Overweight   . Paroxysmal atrial fibrillation ([facility])   . Ulcerative colitis ([facility])     Past Surgical History:  Procedure Laterality Date  . COLON SURGERY  2004   colon resection    Current Outpatient Medications  Medication Sig Dispense Refill  . apixaban (ELIQUIS) 5 MG TABS tablet Take 1 tablet (5 mg total) by mouth 2 (two) times daily. 180 tablet 1  . flecainide (TAMBOCOR) 150 MG tablet Take 1 tablet (150 mg total) by mouth 2 (two) times daily. 180 tablet 3  . losartan-hydrochlorothiazide (HYZAAR) 100-12.5 MG tablet Take 1 tablet by mouth daily. 90 tablet 3  . Mesalamine (ASACOL HD) 800 MG TBEC Take 800 mg by mouth daily.     . metoprolol succinate (TOPROL-XL) 25 MG 24 hr tablet Take 3 tablets (75 mg total) by mouth 2 (two) times daily. Take with or immediately following a meal. 540 tablet 3  . Multiple Vitamin (MULTI-VITAMINS) TABS Take 1 capsule by mouth daily.     . rosuvastatin (CRESTOR) 20 MG tablet Take 1 tablet (20 mg total) by mouth daily. 90 tablet 3   No current facility-administered medications for this visit.    Allergies:   Penicillin g, Penicillins, Meloxicam, and Latex   Social History:  The patient  reports that she has never smoked. She has never used smokeless tobacco. She reports that she does not drink alcohol or use drugs.   Family History:  The patient's family history includes Alzheimer's disease in her mother; Atrial   fibrillation in her father; CAD (age of onset: 40) in her brother; Healthy in her brother and sister; Heart disease in her brother and mother; Other in her brother; Stroke in her father.    ROS:  Please see the history of present illness.   All other systems are personally reviewed and negative.    Exam:    Vital Signs:  122/78, HR 58, Wt 235  Well appearing, alert and conversant, regular work of breathing,  good skin color Eyes- anicteric, neuro- grossly intact, skin-  no apparent rash or lesions or cyanosis, mouth- oral mucosa is pink   Labs/Other Tests and Data Reviewed:    Recent Labs: 11/28/2018: BUN 11; Creatinine, Ser 0.85; Potassium 3.9; Sodium 138 12/29/2018: ALT 18 05/11/2019: Hemoglobin 14.3; Platelets 193   Wt Readings from Last 3 Encounters:  05/11/19 243 lb 12.8 oz (110.6 kg)  11/12/18 243 lb 3.2 oz (110.3 kg)  07/15/18 235 lb (106.6 kg)     Other studies personally reviewed: Additional studies/ records that were reviewed today include: my prior office notes,  Dr Katrinka Blazing and his team's notes,  Prior ecg and echo  Review of the above records today demonstrates: as above  ASSESSMENT & PLAN:    1.  Paroxysmal atrial fibrillation/ atrial flutter The patient has symptomatic, recurrent paroxysmal atrial fibrillation and atrial flutter. she has failed medical therapy with flecainide.  She has first degree AV block with flecainide and I worry that her dose may ultimately need to be reduced Chads2vasc score is 3.  she is anticoagulated with eliquis . Therapeutic strategies for afib including medicine (tikosyn/ amiodarone) and ablation were discussed in detail with the patient today. Risk, benefits, and alternatives to EP study and radiofrequency ablation for afib were also discussed in detail today. These risks include but are not limited to stroke, bleeding, vascular damage, tamponade, perforation, damage to the esophagus, lungs, and other structures, pulmonary vein stenosis, worsening renal function, and death. The patient understands these risk and wishes to proceed.  We will therefore proceed with catheter ablation at the next available time.  Carto, ICE, anesthesia are requested for the procedure.  Will also obtain cardiac CT prior to the procedure to exclude LAA thrombus and further evaluate atrial anatomy.  We will also obtain 2d echo to evaluate for structural changes related to afib.  She had moderate LA enlargement in 2015.  Hopefully we can  stop or reduce flecainide post ablation given PR prolongation on ekg  The importance of lifestyle change was also discussed today  2. Obesity Lifestyle modification was discussed at length  3. OSA She is compliant with CPAP  4. HTN Stable No change required today Echo to evaluate for structural changes    Patient Risk:  after full review of this patients clinical status, I feel that they are at moderate risk at this time.   Today, I have spent 25 minutes with the patient with telehealth technology discussing afib .    SignedHillis Range MD, [facility] 06/17/2019 11:16 AM   [facility] [address] [place] [address] [phone] (office) [phone] (fax)

## 2019-06-17 NOTE — Telephone Encounter (Addendum)
Spoke with the pt and she agrees to Afib Ablation with Dr. Johney Frame 06/30/19..... Echo/ labs planned for 06/22/19  Spoke with the pt re: her instructions but had to LM to go over her appt dates/ times after all was scheduled.

## 2019-06-19 NOTE — Telephone Encounter (Signed)
Pt called to report that she had a "cook out" planned for the Sunday after her COVID test for her Ablation and I advised her that she would need to cancel and we went over the quarantine expectations. Pt verbalized understanding and agreed. She reports that she will cancel and stay at home. Pt declined to reschedule her Ablation as an option.

## 2019-06-22 ENCOUNTER — Other Ambulatory Visit: Payer: BC Managed Care – PPO

## 2019-06-22 ENCOUNTER — Ambulatory Visit (HOSPITAL_COMMUNITY): Payer: Medicare Other | Attending: Cardiovascular Disease

## 2019-06-22 ENCOUNTER — Other Ambulatory Visit: Payer: Self-pay

## 2019-06-22 ENCOUNTER — Other Ambulatory Visit (HOSPITAL_COMMUNITY): Payer: BC Managed Care – PPO

## 2019-06-22 ENCOUNTER — Other Ambulatory Visit: Payer: Medicare Other | Admitting: *Deleted

## 2019-06-22 DIAGNOSIS — I4819 Other persistent atrial fibrillation: Secondary | ICD-10-CM

## 2019-06-22 DIAGNOSIS — I4891 Unspecified atrial fibrillation: Secondary | ICD-10-CM | POA: Diagnosis not present

## 2019-06-22 DIAGNOSIS — Z01810 Encounter for preprocedural cardiovascular examination: Secondary | ICD-10-CM

## 2019-06-23 LAB — CBC WITH DIFFERENTIAL/PLATELET
Basophils Absolute: 0.1 10*3/uL (ref 0.0–0.2)
Basos: 1 %
EOS (ABSOLUTE): 0.1 10*3/uL (ref 0.0–0.4)
Eos: 2 %
Hematocrit: 45.3 % (ref 34.0–46.6)
Hemoglobin: 15.6 g/dL (ref 11.1–15.9)
Immature Grans (Abs): 0 10*3/uL (ref 0.0–0.1)
Immature Granulocytes: 0 %
Lymphocytes Absolute: 1.9 10*3/uL (ref 0.7–3.1)
Lymphs: 25 %
MCH: 35.5 pg — ABNORMAL HIGH (ref 26.6–33.0)
MCHC: 34.4 g/dL (ref 31.5–35.7)
MCV: 103 fL — ABNORMAL HIGH (ref 79–97)
Monocytes Absolute: 0.6 10*3/uL (ref 0.1–0.9)
Monocytes: 8 %
Neutrophils Absolute: 4.8 10*3/uL (ref 1.4–7.0)
Neutrophils: 64 %
Platelets: 248 10*3/uL (ref 150–450)
RBC: 4.4 x10E6/uL (ref 3.77–5.28)
RDW: 12.9 % (ref 11.7–15.4)
WBC: 7.5 10*3/uL (ref 3.4–10.8)

## 2019-06-23 LAB — BASIC METABOLIC PANEL
BUN/Creatinine Ratio: 15 (ref 12–28)
BUN: 15 mg/dL (ref 8–27)
CO2: 28 mmol/L (ref 20–29)
Calcium: 10.2 mg/dL (ref 8.7–10.3)
Chloride: 94 mmol/L — ABNORMAL LOW (ref 96–106)
Creatinine, Ser: 1.01 mg/dL — ABNORMAL HIGH (ref 0.57–1.00)
GFR calc Af Amer: 68 mL/min/{1.73_m2} (ref >59)
GFR calc non Af Amer: 59 mL/min/{1.73_m2} — ABNORMAL LOW (ref >59)
Glucose: 110 mg/dL — ABNORMAL HIGH (ref 65–99)
Potassium: 4 mmol/L (ref 3.5–5.2)
Sodium: 140 mmol/L (ref 134–144)

## 2019-06-24 ENCOUNTER — Telehealth (HOSPITAL_COMMUNITY): Payer: Self-pay | Admitting: Emergency Medicine

## 2019-06-24 NOTE — Telephone Encounter (Signed)
Reaching out to patient to offer assistance regarding upcoming cardiac imaging study; pt verbalizes understanding of appt date/time, parking situation and where to check in, pre-test NPO status and medications ordered, and verified current allergies; name and call back number provided for further questions should they arise Nandita Mathenia RN Navigator Cardiac Imaging [facility] [phone] office [phone] cell 

## 2019-06-25 ENCOUNTER — Ambulatory Visit (HOSPITAL_COMMUNITY)
Admission: RE | Admit: 2019-06-25 | Discharge: 2019-06-25 | Disposition: A | Discharge status: Home / Self Care | Payer: Medicare Other | Source: Ambulatory Visit | Attending: Internal Medicine | Admitting: Internal Medicine

## 2019-06-25 ENCOUNTER — Other Ambulatory Visit: Payer: Self-pay

## 2019-06-25 DIAGNOSIS — I4891 Unspecified atrial fibrillation: Secondary | ICD-10-CM | POA: Insufficient documentation

## 2019-06-25 DIAGNOSIS — Z01810 Encounter for preprocedural cardiovascular examination: Secondary | ICD-10-CM | POA: Insufficient documentation

## 2019-06-25 DIAGNOSIS — I4819 Other persistent atrial fibrillation: Secondary | ICD-10-CM

## 2019-06-25 MED ORDER — IOHEXOL 350 MG/ML SOLN
80.0000 mL | Freq: Once | INTRAVENOUS | Status: AC | PRN
  Administered 2019-06-25: 80 mL via INTRAVENOUS

## 2019-06-26 ENCOUNTER — Telehealth: Payer: Self-pay | Admitting: *Deleted

## 2019-06-26 NOTE — Telephone Encounter (Signed)
-----   Message from [name], MD sent at 06/23/2019  9:21 PM EDT ----- Results reviewed.  Catherine Callahan, please inform pt of result. I will route to primary care also.

## 2019-06-26 NOTE — Telephone Encounter (Signed)
Informed patient of results and verbal understanding expressed.  She did mention that her CT reports her as a female.  Will forward to nurse to see if addendum can be made to correct.

## 2019-06-26 NOTE — Telephone Encounter (Signed)
lmtcb

## 2019-06-27 ENCOUNTER — Other Ambulatory Visit (HOSPITAL_COMMUNITY)
Admission: RE | Admit: 2019-06-27 | Discharge: 2019-06-27 | Disposition: A | Discharge status: Home / Self Care | Payer: Medicare Other | Source: Ambulatory Visit | Attending: Internal Medicine | Admitting: Internal Medicine

## 2019-06-27 DIAGNOSIS — Z01812 Encounter for preprocedural laboratory examination: Secondary | ICD-10-CM | POA: Insufficient documentation

## 2019-06-27 DIAGNOSIS — Z20822 Contact with and (suspected) exposure to covid-19: Secondary | ICD-10-CM | POA: Insufficient documentation

## 2019-06-27 LAB — SARS CORONAVIRUS 2 (TAT 6-24 HRS): SARS Coronavirus 2: NEGATIVE

## 2019-06-29 ENCOUNTER — Other Ambulatory Visit (HOSPITAL_COMMUNITY): Payer: BC Managed Care – PPO

## 2019-06-29 NOTE — Telephone Encounter (Signed)
Message sent to reading doctor and SW to review if can addend cardiac CT results to identify Pt as female rather than female.

## 2019-06-30 ENCOUNTER — Encounter (HOSPITAL_COMMUNITY): Payer: Self-pay | Admitting: Internal Medicine

## 2019-06-30 ENCOUNTER — Encounter (HOSPITAL_COMMUNITY)
Admission: RE | Disposition: A | Discharge status: Home / Self Care | Payer: BC Managed Care – PPO | Source: Home / Self Care | Attending: Internal Medicine

## 2019-06-30 ENCOUNTER — Ambulatory Visit (HOSPITAL_COMMUNITY): Payer: Medicare Other | Admitting: Certified Registered Nurse Anesthetist

## 2019-06-30 ENCOUNTER — Other Ambulatory Visit: Payer: Self-pay

## 2019-06-30 ENCOUNTER — Ambulatory Visit (HOSPITAL_COMMUNITY)
Admission: RE | Admit: 2019-06-30 | Discharge: 2019-06-30 | Disposition: A | Discharge status: Home / Self Care | Payer: Medicare Other | Attending: Internal Medicine | Admitting: Internal Medicine

## 2019-06-30 DIAGNOSIS — M1990 Unspecified osteoarthritis, unspecified site: Secondary | ICD-10-CM | POA: Insufficient documentation

## 2019-06-30 DIAGNOSIS — I483 Typical atrial flutter: Secondary | ICD-10-CM | POA: Insufficient documentation

## 2019-06-30 DIAGNOSIS — G4733 Obstructive sleep apnea (adult) (pediatric): Secondary | ICD-10-CM | POA: Insufficient documentation

## 2019-06-30 DIAGNOSIS — Z7901 Long term (current) use of anticoagulants: Secondary | ICD-10-CM | POA: Insufficient documentation

## 2019-06-30 DIAGNOSIS — Z8249 Family history of ischemic heart disease and other diseases of the circulatory system: Secondary | ICD-10-CM | POA: Diagnosis not present

## 2019-06-30 DIAGNOSIS — Z888 Allergy status to other drugs, medicaments and biological substances status: Secondary | ICD-10-CM | POA: Insufficient documentation

## 2019-06-30 DIAGNOSIS — Z91040 Latex allergy status: Secondary | ICD-10-CM | POA: Insufficient documentation

## 2019-06-30 DIAGNOSIS — I10 Essential (primary) hypertension: Secondary | ICD-10-CM | POA: Insufficient documentation

## 2019-06-30 DIAGNOSIS — Z880 Allergy status to penicillin: Secondary | ICD-10-CM | POA: Diagnosis not present

## 2019-06-30 DIAGNOSIS — E669 Obesity, unspecified: Secondary | ICD-10-CM | POA: Diagnosis not present

## 2019-06-30 DIAGNOSIS — I440 Atrioventricular block, first degree: Secondary | ICD-10-CM | POA: Diagnosis not present

## 2019-06-30 DIAGNOSIS — Z79899 Other long term (current) drug therapy: Secondary | ICD-10-CM | POA: Insufficient documentation

## 2019-06-30 DIAGNOSIS — Z6839 Body mass index (BMI) 39.0-39.9, adult: Secondary | ICD-10-CM | POA: Insufficient documentation

## 2019-06-30 DIAGNOSIS — I480 Paroxysmal atrial fibrillation: Secondary | ICD-10-CM | POA: Diagnosis present

## 2019-06-30 HISTORY — PX: ATRIAL FIBRILLATION ABLATION: EP1191

## 2019-06-30 LAB — POCT ACTIVATED CLOTTING TIME
Activated Clotting Time: 290 seconds
Activated Clotting Time: 312 seconds

## 2019-06-30 SURGERY — ATRIAL FIBRILLATION ABLATION
Anesthesia: General

## 2019-06-30 MED ORDER — PROTAMINE SULFATE 10 MG/ML IV SOLN
INTRAVENOUS | Status: DC | PRN
  Administered 2019-06-30: 10 mg via INTRAVENOUS
  Administered 2019-06-30: 20 mg via INTRAVENOUS
  Administered 2019-06-30: 10 mg via INTRAVENOUS

## 2019-06-30 MED ORDER — APIXABAN 5 MG PO TABS
5.0000 mg | ORAL_TABLET | Freq: Once | ORAL | Status: AC
  Administered 2019-06-30: 5 mg via ORAL
  Filled 2019-06-30: qty 1

## 2019-06-30 MED ORDER — HEPARIN (PORCINE) IN NACL 2000-0.9 UNIT/L-% IV SOLN
INTRAVENOUS | Status: DC | PRN
  Administered 2019-06-30: 500 mL

## 2019-06-30 MED ORDER — HEPARIN SODIUM (PORCINE) 1000 UNIT/ML IJ SOLN
INTRAMUSCULAR | Status: AC
  Filled 2019-06-30: qty 1

## 2019-06-30 MED ORDER — LIDOCAINE 2% (20 MG/ML) 5 ML SYRINGE
INTRAMUSCULAR | Status: DC | PRN
  Administered 2019-06-30: 60 mg via INTRAVENOUS

## 2019-06-30 MED ORDER — ACETAMINOPHEN 325 MG PO TABS: 650.0000 mg | ORAL_TABLET | ORAL | Status: DC | PRN

## 2019-06-30 MED ORDER — HEPARIN SODIUM (PORCINE) 1000 UNIT/ML IJ SOLN
INTRAMUSCULAR | Status: DC | PRN
  Administered 2019-06-30: 3000 [IU] via INTRAVENOUS

## 2019-06-30 MED ORDER — HYDROCODONE-ACETAMINOPHEN 5-325 MG PO TABS
1.0000 | ORAL_TABLET | ORAL | Status: DC | PRN
Start: 2019-06-30 — End: 2019-06-30

## 2019-06-30 MED ORDER — METOPROLOL SUCCINATE ER 25 MG PO TB24: 25.0000 mg | ORAL_TABLET | Freq: Every day | ORAL | 3 refills | Status: DC

## 2019-06-30 MED ORDER — SODIUM CHLORIDE 0.9% FLUSH: 3.0000 mL | INTRAVENOUS | Status: DC | PRN

## 2019-06-30 MED ORDER — FENTANYL CITRATE (PF) 100 MCG/2ML IJ SOLN
INTRAMUSCULAR | Status: DC | PRN
  Administered 2019-06-30: 25 ug via INTRAVENOUS

## 2019-06-30 MED ORDER — SODIUM CHLORIDE 0.9 % IV SOLN: 250.0000 mL | INTRAVENOUS | Status: DC | PRN

## 2019-06-30 MED ORDER — PHENYLEPHRINE 40 MCG/ML (10ML) SYRINGE FOR IV PUSH (FOR BLOOD PRESSURE SUPPORT)
PREFILLED_SYRINGE | INTRAVENOUS | Status: DC | PRN
  Administered 2019-06-30: 120 ug via INTRAVENOUS

## 2019-06-30 MED ORDER — SODIUM CHLORIDE 0.9% FLUSH: 3.0000 mL | Freq: Two times a day (BID) | INTRAVENOUS | Status: DC

## 2019-06-30 MED ORDER — SUGAMMADEX SODIUM 200 MG/2ML IV SOLN
INTRAVENOUS | Status: DC | PRN
  Administered 2019-06-30: 200 mg via INTRAVENOUS

## 2019-06-30 MED ORDER — ISOPROTERENOL HCL 0.2 MG/ML IJ SOLN
INTRAMUSCULAR | Status: AC
  Filled 2019-06-30: qty 5

## 2019-06-30 MED ORDER — ONDANSETRON HCL 4 MG/2ML IJ SOLN
4.0000 mg | Freq: Four times a day (QID) | INTRAMUSCULAR | Status: DC | PRN
Start: 2019-06-30 — End: 2019-06-30

## 2019-06-30 MED ORDER — SODIUM CHLORIDE 0.9 % IV SOLN: INTRAVENOUS | Status: DC

## 2019-06-30 MED ORDER — MIDAZOLAM HCL 5 MG/5ML IJ SOLN
INTRAMUSCULAR | Status: DC | PRN
  Administered 2019-06-30: 2 mg via INTRAVENOUS

## 2019-06-30 MED ORDER — PANTOPRAZOLE SODIUM 40 MG PO TBEC
40.0000 mg | DELAYED_RELEASE_TABLET | Freq: Every day | ORAL | 0 refills | Status: DC
Start: 2019-06-30 — End: 2019-08-07

## 2019-06-30 MED ORDER — DEXAMETHASONE SODIUM PHOSPHATE 10 MG/ML IJ SOLN
INTRAMUSCULAR | Status: DC | PRN
  Administered 2019-06-30: 10 mg via INTRAVENOUS

## 2019-06-30 MED ORDER — PHENYLEPHRINE HCL-NACL 10-0.9 MG/250ML-% IV SOLN
INTRAVENOUS | Status: DC | PRN
  Administered 2019-06-30: 25 ug/min via INTRAVENOUS

## 2019-06-30 MED ORDER — HEPARIN (PORCINE) IN NACL 2000-0.9 UNIT/L-% IV SOLN
INTRAVENOUS | Status: DC | PRN
  Administered 2019-06-30: 1000 mL

## 2019-06-30 MED ORDER — HEPARIN SODIUM (PORCINE) 1000 UNIT/ML IJ SOLN
INTRAMUSCULAR | Status: DC | PRN
  Administered 2019-06-30: 14000 [IU] via INTRAVENOUS

## 2019-06-30 MED ORDER — ONDANSETRON HCL 4 MG/2ML IJ SOLN
INTRAMUSCULAR | Status: DC | PRN
  Administered 2019-06-30: 4 mg via INTRAVENOUS

## 2019-06-30 MED ORDER — PROPOFOL 10 MG/ML IV BOLUS
INTRAVENOUS | Status: DC | PRN
  Administered 2019-06-30: 130 mg via INTRAVENOUS

## 2019-06-30 MED ORDER — SUCCINYLCHOLINE CHLORIDE 200 MG/10ML IV SOSY
PREFILLED_SYRINGE | INTRAVENOUS | Status: DC | PRN
  Administered 2019-06-30: 120 mg via INTRAVENOUS

## 2019-06-30 MED ORDER — ROCURONIUM BROMIDE 100 MG/10ML IV SOLN
INTRAVENOUS | Status: DC | PRN
  Administered 2019-06-30: 60 mg via INTRAVENOUS

## 2019-06-30 SURGICAL SUPPLY — 18 items
BLANKET WARM UNDERBOD FULL ACC (MISCELLANEOUS) ×2 IMPLANT
CATH 8FR REPROCESSED SOUNDSTAR (CATHETERS) ×2 IMPLANT
CATH MAPPNG PENTARAY F 2-6-2MM (CATHETERS) ×1 IMPLANT
CATH SMTCH THERMOCOOL SF DF (CATHETERS) ×2 IMPLANT
CATH WEBSTER BI DIR CS D-F CRV (CATHETERS) ×2 IMPLANT
COVER SWIFTLINK CONNECTOR (BAG) ×2 IMPLANT
DEVICE CLOSURE PERCLS PRGLD 6F (VASCULAR PRODUCTS) ×3 IMPLANT
NEEDLE BAYLIS TRANSSEPTAL 71CM (NEEDLE) ×2 IMPLANT
PACK EP LATEX FREE (CUSTOM PROCEDURE TRAY) ×2
PACK EP LF (CUSTOM PROCEDURE TRAY) ×1 IMPLANT
PAD PRO RADIOLUCENT 2001M-C (PAD) ×2 IMPLANT
PATCH CARTO3 (PAD) ×2 IMPLANT
PENTARAY F 2-6-2MM (CATHETERS) ×2
PERCLOSE PROGLIDE 6F (VASCULAR PRODUCTS) ×6
SHEATH PINNACLE 7F 10CM (SHEATH) ×4 IMPLANT
SHEATH PINNACLE 9F 10CM (SHEATH) ×2 IMPLANT
SHEATH SWARTZ TS SL2 63CM 8.5F (SHEATH) ×2 IMPLANT
TUBING SMART ABLATE COOLFLOW (TUBING) ×2 IMPLANT

## 2019-06-30 NOTE — Transfer of Care (Signed)
Immediate Anesthesia Transfer of Care Note  Patient: Catherine Callahan  Procedure(s) Performed: ATRIAL FIBRILLATION ABLATION (N/A )  Patient Location: Cath Lab  Anesthesia Type:General  Level of Consciousness: awake, alert  and oriented  Airway & Oxygen Therapy: Patient Spontanous Breathing and Patient connected to nasal cannula oxygen  Post-op Assessment: Report given to RN and Post -op Vital signs reviewed and stable  Post vital signs: Reviewed and stable  Last Vitals:  Vitals Value Taken Time  BP    Temp    Pulse 66 06/30/19 1328  Resp 14 06/30/19 1328  SpO2 97 % 06/30/19 1328  Vitals shown include unvalidated device data.  Last Pain:  Vitals:   06/30/19 0837  TempSrc: Skin         Complications: No apparent anesthesia complications

## 2019-06-30 NOTE — Progress Notes (Signed)
Patient was given discharge instructions. Patient verbalized understanding. ?

## 2019-06-30 NOTE — Anesthesia Procedure Notes (Signed)
Procedure Name: Intubation Date/Time: 06/30/2019 10:56 AM Performed by: Candis Shine, CRNA Pre-anesthesia Checklist: Patient identified, Emergency Drugs available, Suction available and Patient being monitored Patient Re-evaluated:Patient Re-evaluated prior to induction Oxygen Delivery Method: Circle System Utilized Preoxygenation: Pre-oxygenation with 100% oxygen Induction Type: IV induction and Rapid sequence Laryngoscope Size: Mac and 3 Grade View: Grade I Tube type: Oral Tube size: 7.0 mm Number of attempts: 1 Airway Equipment and Method: Stylet Placement Confirmation: ETT inserted through vocal cords under direct vision,  positive ETCO2 and breath sounds checked- equal and bilateral Secured at: 22 cm Tube secured with: Tape Dental Injury: Teeth and Oropharynx as per pre-operative assessment

## 2019-06-30 NOTE — Discharge Instructions (Signed)
Post procedure care instructions No driving for 4 days. No lifting over 5 lbs for 1 week. No vigorous or sexual activity for 1 week. You may return to work/your usual activities on 07/07/2019. Keep procedure site clean & dry. If you notice increased pain, swelling, bleeding or pus, call/return!  You may shower, but no soaking baths/hot tubs/pools for 1 week.     Femoral Site Care This sheet gives you information about how to care for yourself after your procedure. Your health care provider may also give you more specific instructions. If you have problems or questions, contact your health care provider. What can I expect after the procedure? After the procedure, it is common to have:  Bruising that usually fades within 1-2 weeks.  Tenderness at the site. Follow these instructions at home: Wound care  Follow instructions from your health care provider about how to take care of your insertion site. Make sure you: ? Wash your hands with soap and water before you change your bandage (dressing). If soap and water are not available, use hand sanitizer. ? Change your dressing as told by your health care provider. ? Leave stitches (sutures), skin glue, or adhesive strips in place. These skin closures may need to stay in place for 2 weeks or longer. If adhesive strip edges start to loosen and curl up, you may trim the loose edges. Do not remove adhesive strips completely unless your health care provider tells you to do that.  Do not take baths, swim, or use a hot tub until your health care provider approves.  You may shower 24-48 hours after the procedure or as told by your health care provider. ? Gently wash the site with plain soap and water. ? Pat the area dry with a clean towel. ? Do not rub the site. This may cause bleeding.  Do not apply powder or lotion to the site. Keep the site clean and dry.  Check your femoral site every day for signs of infection. Check for: ? Redness, swelling, or  pain. ? Fluid or blood. ? Warmth. ? Pus or a bad smell. Activity  For the first 2-3 days after your procedure, or as long as directed: ? Avoid climbing stairs as much as possible. ? Do not squat.  Do not lift anything that is heavier than 10 lb (4.5 kg), or the limit that you are told, until your health care provider says that it is safe.  Rest as directed. ? Avoid sitting for a long time without moving. Get up to take short walks every 1-2 hours.  Do not drive for 24 hours if you were given a medicine to help you relax (sedative). General instructions  Take over-the-counter and prescription medicines only as told by your health care provider.  Keep all follow-up visits as told by your health care provider. This is important. Contact a health care provider if you have:  A fever or chills.  You have redness, swelling, or pain around your insertion site. Get help right away if:  The catheter insertion area swells very fast.  You pass out.  You suddenly start to sweat or your skin gets clammy.  The catheter insertion area is bleeding, and the bleeding does not stop when you hold steady pressure on the area.  The area near or just beyond the catheter insertion site becomes pale, cool, tingly, or numb. These symptoms may represent a serious problem that is an emergency. Do not wait to see if the symptoms will  go away. Get medical help right away. Call your local emergency services (911 in the [place] Do not drive yourself to the hospital. Summary  After the procedure, it is common to have bruising that usually fades within 1-2 weeks.  Check your femoral site every day for signs of infection.  Do not lift anything that is heavier than 10 lb (4.5 kg), or the limit that you are told, until your health care provider says that it is safe. This information is not intended to replace advice given to you by your health care provider. Make sure you discuss any questions you have with  your health care provider. Document Revised: 03/04/2017 Document Reviewed: 03/04/2017 Elsevier Patient Education  2020 [employer].

## 2019-06-30 NOTE — Interval H&P Note (Signed)
History and Physical Interval Note:  06/30/2019 10:19 AM  Catherine Callahan  has presented today for surgery, with the diagnosis of atrial fibrillation.  The various methods of treatment have been discussed with the patient and family. After consideration of risks, benefits and other options for treatment, the patient has consented to  Procedure(s): ATRIAL FIBRILLATION ABLATION (N/A) as a surgical intervention.  The patient's history has been reviewed, patient examined, no change in status, stable for surgery.  I have reviewed the patient's chart and labs.  Questions were answered to the patient's satisfaction.    Cardiac CT was reviewed with her today.  We discussed lifestyle modification.  I will also review with Dr Katrinka Blazing.  She has no ischemic symptoms.  I spoke with Dr Delton See who did not feel that further CV resting was required prior to ablation in the absence of symptoms. Echo was also reviewed with patient. She reports compliance with eliquis without interruption.    [name]

## 2019-06-30 NOTE — Anesthesia Preprocedure Evaluation (Addendum)
Anesthesia Evaluation  Patient identified by MRN, date of birth, ID band Patient awake    Reviewed: Allergy & Precautions, NPO status , Patient's Chart, lab work & pertinent test results  Airway Mallampati: II  TM Distance: >3 FB     Dental   Pulmonary    breath sounds clear to auscultation       Cardiovascular hypertension, + dysrhythmias Atrial Fibrillation  Rhythm:Regular Rate:Normal     Neuro/Psych    GI/Hepatic Neg liver ROS, PUD,   Endo/Other  negative endocrine ROS  Renal/GU negative Renal ROS     Musculoskeletal   Abdominal   Peds  Hematology   Anesthesia Other Findings   Reproductive/Obstetrics                            Anesthesia Physical Anesthesia Plan  ASA: III  Anesthesia Plan: General   Post-op Pain Management:    Induction: Intravenous  PONV Risk Score and Plan: 3 and Ondansetron, Dexamethasone and Midazolam  Airway Management Planned: Oral ETT  Additional Equipment:   Intra-op Plan:   Post-operative Plan:   Informed Consent: I have reviewed the patients History and Physical, chart, labs and discussed the procedure including the risks, benefits and alternatives for the proposed anesthesia with the patient or authorized representative who has indicated his/her understanding and acceptance.     Dental advisory given  Plan Discussed with: Anesthesiologist and CRNA  Anesthesia Plan Comments:        Anesthesia Quick Evaluation

## 2019-06-30 NOTE — Anesthesia Postprocedure Evaluation (Signed)
Anesthesia Post Note  Patient: Catherine Callahan  Procedure(s) Performed: ATRIAL FIBRILLATION ABLATION (N/A )     Patient location during evaluation: PACU Anesthesia Type: General Level of consciousness: awake Pain management: pain level controlled Vital Signs Assessment: post-procedure vital signs reviewed and stable Respiratory status: spontaneous breathing Cardiovascular status: blood pressure returned to baseline Postop Assessment: no apparent nausea or vomiting Anesthetic complications: no    Last Vitals:  Vitals:   06/30/19 1430 06/30/19 1450  BP: 115/66 130/79  Pulse: 65 65  Resp: 19 13  Temp:    SpO2: 96% 96%    Last Pain:  Vitals:   06/30/19 1455  TempSrc:   PainSc: 0-No pain                 Makynlee Kressin

## 2019-06-30 NOTE — Care Management Important Message (Signed)
Important Message  Patient Details  Name: [name] MRN: [id number] Date of Birth: [birth year]   Medicare Important Message Given:  Yes     Dorena Bodo 06/30/2019, 3:06 PM

## 2019-06-30 NOTE — Care Management Important Message (Signed)
Important Message  Patient Details  Name: Catherine Callahan MRN: [id number] Date of Birth: October 10, 1955   Medicare Important Message Given:  Yes   Please disregard IM note.  Note was enter in error.    Gabriel Paulding 06/30/2019, 3:08 PM

## 2019-07-01 ENCOUNTER — Encounter: Payer: Self-pay | Admitting: Interventional Cardiology

## 2019-07-01 DIAGNOSIS — I2584 Coronary atherosclerosis due to calcified coronary lesion: Secondary | ICD-10-CM | POA: Insufficient documentation

## 2019-07-01 DIAGNOSIS — I2510 Atherosclerotic heart disease of native coronary artery without angina pectoris: Secondary | ICD-10-CM | POA: Insufficient documentation

## 2019-07-01 HISTORY — DX: Atherosclerotic heart disease of native coronary artery without angina pectoris: I25.10

## 2019-07-28 ENCOUNTER — Ambulatory Visit (HOSPITAL_COMMUNITY)
Admission: RE | Admit: 2019-07-28 | Discharge: 2019-07-28 | Disposition: A | Discharge status: Home / Self Care | Payer: Medicare Other | Source: Ambulatory Visit | Attending: Physician Assistant | Admitting: Physician Assistant

## 2019-07-28 ENCOUNTER — Other Ambulatory Visit: Payer: Self-pay

## 2019-07-28 VITALS — BP 94/52 | HR 68 | Ht 65.0 in | Wt 245.4 lb

## 2019-07-28 DIAGNOSIS — I480 Paroxysmal atrial fibrillation: Secondary | ICD-10-CM | POA: Diagnosis not present

## 2019-07-28 DIAGNOSIS — I483 Typical atrial flutter: Secondary | ICD-10-CM | POA: Insufficient documentation

## 2019-07-28 DIAGNOSIS — E669 Obesity, unspecified: Secondary | ICD-10-CM | POA: Insufficient documentation

## 2019-07-28 DIAGNOSIS — D6869 Other thrombophilia: Secondary | ICD-10-CM | POA: Diagnosis not present

## 2019-07-28 DIAGNOSIS — I2510 Atherosclerotic heart disease of native coronary artery without angina pectoris: Secondary | ICD-10-CM | POA: Diagnosis not present

## 2019-07-28 DIAGNOSIS — I10 Essential (primary) hypertension: Secondary | ICD-10-CM | POA: Insufficient documentation

## 2019-07-28 DIAGNOSIS — Z6841 Body Mass Index (BMI) 40.0 and over, adult: Secondary | ICD-10-CM | POA: Insufficient documentation

## 2019-07-28 DIAGNOSIS — Z7901 Long term (current) use of anticoagulants: Secondary | ICD-10-CM | POA: Insufficient documentation

## 2019-07-28 DIAGNOSIS — G4733 Obstructive sleep apnea (adult) (pediatric): Secondary | ICD-10-CM | POA: Insufficient documentation

## 2019-07-28 DIAGNOSIS — K5190 Ulcerative colitis, unspecified, without complications: Secondary | ICD-10-CM | POA: Insufficient documentation

## 2019-07-28 DIAGNOSIS — Z886 Allergy status to analgesic agent status: Secondary | ICD-10-CM | POA: Insufficient documentation

## 2019-07-28 DIAGNOSIS — Z9989 Dependence on other enabling machines and devices: Secondary | ICD-10-CM | POA: Insufficient documentation

## 2019-07-28 DIAGNOSIS — Z79899 Other long term (current) drug therapy: Secondary | ICD-10-CM | POA: Insufficient documentation

## 2019-07-28 DIAGNOSIS — Z8249 Family history of ischemic heart disease and other diseases of the circulatory system: Secondary | ICD-10-CM | POA: Insufficient documentation

## 2019-07-28 DIAGNOSIS — Z880 Allergy status to penicillin: Secondary | ICD-10-CM | POA: Insufficient documentation

## 2019-07-28 DIAGNOSIS — M1990 Unspecified osteoarthritis, unspecified site: Secondary | ICD-10-CM | POA: Diagnosis not present

## 2019-07-28 DIAGNOSIS — Z91040 Latex allergy status: Secondary | ICD-10-CM | POA: Insufficient documentation

## 2019-07-28 MED ORDER — DILTIAZEM HCL 30 MG PO TABS
ORAL_TABLET | ORAL | 1 refills | Status: DC
Start: 2019-07-28 — End: 2019-08-20

## 2019-07-28 NOTE — Progress Notes (Signed)
Primary Care Physician: Merri Brunette, MD Primary Cardiologist: Dr Katrinka Blazing Primary Electrophysiologist: Dr Johney Frame Referring Physician: Dr Lavon Paganini is a 64 y.o. female with a history of atrial flutter, paroxysmal atrial fibrillation, OSA, HTN, and ulcerative colitis who presents for follow up in the [facility].  The patient was initially diagnosed with atrial flutter in 2012 and subsequently atrial fibrillation. She was maintaining on flecainide but had increasing episodes of afib with symptoms of SOB, fatigue, and palpitations. Patient is on Eliquis for a CHADS2VASC score of 3. She underwent afib and flutter ablation with Dr Johney Frame on 06/30/19. She does reports that she has had several episodes of heart racing since the procedure which last about 45 minutes. She has taken extra doses of metoprolol. She denies CP, swallowing, or groin issues.   Today, she denies symptoms of chest pain, shortness of breath, orthopnea, PND, lower extremity edema, dizziness, presyncope, syncope, snoring, daytime somnolence, bleeding, or neurologic sequela. The patient is tolerating medications without difficulties and is otherwise without complaint today.    Atrial Fibrillation Risk Factors:  she does have symptoms or diagnosis of sleep apnea. she is compliant with CPAP therapy.   she has a BMI of Body mass index is 40.84 kg/m.Marland Kitchen Filed Weights   07/28/19 0848  Weight: 111.3 kg    Family History  Problem Relation Age of Onset  . Healthy Brother   . CAD Brother 16  . Heart disease Brother   . Other Brother        open heart surgery  . Heart disease Mother   . Alzheimer's disease Mother   . Atrial fibrillation Father   . Stroke Father   . Healthy Sister      Atrial Fibrillation Management history:  Previous antiarrhythmic drugs: flecainide Previous cardioversions: none Previous ablations: 06/30/19 afib and flutter CHADS2VASC score: 3  Anticoagulation history: Eliquis   Past Medical History:  Diagnosis Date  . Arthritis   . Atrial flutter (HCC) 01/05/2013  . CAD (coronary artery disease) 07/01/2019  . Hypertension, essential, benign 01/11/2011  . OSA on CPAP 01/05/2013  . Overweight   . Paroxysmal atrial fibrillation (HCC)   . Ulcerative colitis [facility])    Past Surgical History:  Procedure Laterality Date  . ATRIAL FIBRILLATION ABLATION N/A 06/30/2019   Procedure: ATRIAL FIBRILLATION ABLATION;  Surgeon: [name], MD;  [facility]VAS[facility] CV LAB;  Service: Cardiovascular;  Laterality: N/A;  . COLON SURGERY  2004   colon resection    Current Outpatient Medications  Medication Sig Dispense Refill  . apixaban (ELIQUIS) 5 MG TABS tablet Take 1 tablet (5 mg total) by mouth 2 (two) times daily. 180 tablet 1  . FIBER PO Take 4 capsules by mouth at bedtime.    Marland Kitchen losartan-hydrochlorothiazide (HYZAAR) 100-12.5 MG tablet Take 1 tablet by mouth daily. 90 tablet 3  . Mesalamine (ASACOL HD) 800 MG TBEC Take 800 mg by mouth daily.     . metoprolol succinate (TOPROL-XL) 25 MG 24 hr tablet Take 1 tablet (25 mg total) by mouth daily. Take with or immediately following a meal. 540 tablet 3  . Multiple Vitamin (MULTIVITAMIN WITH MINERALS) TABS tablet Take 1 tablet by mouth daily. Centrum Silver    . pantoprazole (PROTONIX) 40 MG tablet Take 1 tablet (40 mg total) by mouth daily. 45 tablet 0  . rosuvastatin (CRESTOR) 20 MG tablet Take 1 tablet (20 mg total) by mouth daily. 90 tablet 3   No  current facility-administered medications for this encounter.    Allergies  Allergen Reactions  . Penicillin G Anaphylaxis  . Penicillins Anaphylaxis  . Meloxicam Hives  . Latex Rash and Hives    Social History   Socioeconomic History  . Marital status: Widowed    Spouse name: Not on file  . Number of children: Not on file  . Years of education: Not on file  . Highest education level: Not on file  Occupational History  . Not on  file  Tobacco Use  . Smoking status: Never Smoker  . Smokeless tobacco: Never Used  Substance and Sexual Activity  . Alcohol use: No  . Drug use: No  . Sexual activity: Not on file  Other Topics Concern  . Not on file  Social History Narrative   Pt lives in [place] alone.  Widowed.  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   Social Determinants of Health   Financial Resource Strain:   . Difficulty of Paying Living Expenses:   Food Insecurity:   . Worried About Charity fundraiser in the Last Year:   . Arboriculturist in the Last Year:   Transportation Needs:   . Film/video editor (Medical):   Marland Kitchen Lack of Transportation (Non-Medical):   Physical Activity:   . Days of Exercise per Week:   . Minutes of Exercise per Session:   Stress:   . Feeling of Stress :   Social Connections:   . Frequency of Communication with Friends and Family:   . Frequency of Social Gatherings with Friends and Family:   . Attends Religious Services:   . Active Member of Clubs or Organizations:   . Attends Archivist Meetings:   Marland Kitchen Marital Status:   Intimate Partner Violence:   . Fear of Current or Ex-Partner:   . Emotionally Abused:   Marland Kitchen Physically Abused:   . Sexually Abused:      ROS- All systems are reviewed and negative except as per the HPI above.  Physical Exam: Vitals:   07/28/19 0848  BP: (!) 94/52  Pulse: 68  Weight: 111.3 kg  Height: 5\' 5"  (1.651 m)    GEN- The patient is well appearing obese female, alert and oriented x 3 today.   Head- normocephalic, atraumatic Eyes-  Sclera clear, conjunctiva pink Ears- hearing intact Oropharynx- clear Neck- supple  Lungs- Clear to ausculation bilaterally, normal work of breathing Heart- Regular rate and rhythm, no murmurs, rubs or gallops  GI- soft, NT, ND, + BS Extremities- no clubbing, cyanosis, or edema MS- no significant deformity or atrophy  Skin- no rash or lesion Psych- euthymic mood, full affect Neuro- strength and sensation are intact  Wt Readings from Last 3 Encounters:  07/28/19 111.3 kg  06/30/19 108.9 kg  05/11/19 110.6 kg    EKG today demonstrates SR HR 68 1st degree AV block, PACs, PR 252, QRS 76, QTc 446  Echo 06/22/19 demonstrated  1. Left ventricular ejection fraction, by estimation, is 60 to 65%. The  left ventricle has normal function. The left ventricle has no regional  wall motion abnormalities. Left ventricular diastolic parameters are  indeterminate.  2. Right ventricular systolic function is normal. The right ventricular  size is normal. There is mildly elevated pulmonary artery systolic  pressure.  3. Left atrial size was moderately dilated.  4. Right atrial size was mildly dilated.  5. The  mitral valve is normal in structure. Trivial mitral valve  regurgitation. No evidence of mitral stenosis.  6. The aortic valve is tricuspid. Aortic valve regurgitation is not  visualized. Mild aortic valve sclerosis is present, with no evidence of  aortic valve stenosis.  7. The inferior vena cava is normal in size with greater than 50%  respiratory variability, suggesting right atrial pressure of 3 mmHg.   Epic records are reviewed at length today  CHA2DS2-VASc Score = 3  The patient's score is based upon: CHF History: 0 HTN History: 1 Age : 1 Diabetes History: 0 Stroke History: 0 Vascular Disease History: 0 Gender: 1      ASSESSMENT AND PLAN: 1. Paroxysmal Atrial Fibrillation/typical atrial flutter The patient's CHA2DS2-VASc score is 3, indicating a 3.2% annual risk of stroke. S/p afib and flutter ablation 06/30/19. Flecainide stopped post ablation 2/2 prolonged PR. Reassured patient that some afib post ablation is normal for the first 3 months.  Will start diltiazem 30 mg PRN q 4 hours for heart racing. Continue Toprol 25 mg daily Continue Eliquis 5 mg BID with no missed doses for at  least 3 months post ablation.   2. Secondary Hypercoagulable State (ICD10:  D68.69) The patient is at significant risk for stroke/thromboembolism based upon her CHA2DS2-VASc Score of 3.  Continue Apixaban (Eliquis).   3. Obesity Body mass index is 40.84 kg/m. Lifestyle modification was discussed at length including regular exercise and weight reduction.  4. Obstructive sleep apnea The importance of adequate treatment of sleep apnea was discussed today in order to improve our ability to maintain sinus rhythm long term. Patient reports compliance with CPAP therapy.  5. HTN Stable, no changes today.   Follow up with Dr Johney Frame as scheduled.    Jorja Loa PA-C [facility] [address] [place], [place] [address] [phone] 07/28/2019 9:11 AM

## 2019-08-07 ENCOUNTER — Other Ambulatory Visit: Payer: Self-pay | Admitting: Internal Medicine

## 2019-08-20 ENCOUNTER — Other Ambulatory Visit (HOSPITAL_COMMUNITY): Payer: Self-pay

## 2019-08-20 MED ORDER — DILTIAZEM HCL 30 MG PO TABS: ORAL_TABLET | ORAL | 1 refills | Status: DC

## 2019-09-09 ENCOUNTER — Other Ambulatory Visit: Payer: Self-pay | Admitting: Interventional Cardiology

## 2019-09-11 ENCOUNTER — Telehealth (HOSPITAL_COMMUNITY): Payer: Self-pay | Admitting: *Deleted

## 2019-09-11 NOTE — Telephone Encounter (Signed)
Pt called in stating she went into AF this morning HR now 83 after 2 doses of cardizem. BP 122/74. Per Rudi Coco NP can increase metoprolol to extra 1/2 tablet a day while in AF. Pt will call if still out on Monday.

## 2019-09-21 ENCOUNTER — Other Ambulatory Visit (HOSPITAL_COMMUNITY): Payer: Self-pay

## 2019-09-21 MED ORDER — DILTIAZEM HCL 30 MG PO TABS: ORAL_TABLET | ORAL | 1 refills | Status: DC

## 2019-09-23 ENCOUNTER — Other Ambulatory Visit (HOSPITAL_COMMUNITY): Payer: Self-pay

## 2019-09-23 MED ORDER — DILTIAZEM HCL 30 MG PO TABS: ORAL_TABLET | ORAL | 1 refills | Status: DC

## 2019-09-25 NOTE — Progress Notes (Signed)
Virtual Visit via Video Note   This visit type was conducted due to national recommendations for restrictions regarding the COVID-19 Pandemic (e.g. social distancing) in an effort to limit this patient's exposure and mitigate transmission in our community.  Due to her co-morbid illnesses, this patient is at least at moderate risk for complications without adequate follow up.  This format is felt to be most appropriate for this patient at this time.  All issues noted in this document were discussed and addressed.  A limited physical exam was performed with this format.  Please refer to the patient's chart for her consent to telehealth for [facility].     Evaluation Performed:  Follow-up visit  This visit type was conducted due to national recommendations for restrictions regarding the COVID-19 Pandemic (e.g. social distancing).  This format is felt to be most appropriate for this patient at this time.  All issues noted in this document were discussed and addressed.  No physical exam was performed (except for noted visual exam findings with Video Visits).  Please refer to the patient's chart (MyChart message for video visits and phone note for telephone visits) for the patient's consent to telehealth for [facility].  Date:  09/28/2019   ID:  Catherine Callahan, DOB [birth year], MRN [id number]  Patient Location:  Home  Provider location:   [place]  PCP:  Merri Brunette, MD  Cardiologist:  Lesleigh Noe, MD  Sleep Medicine: Armanda Magic, MD Electrophysiologist:  None   Chief Complaint:  OS   History of Present Illness:    Catherine Callahan is a 64 y.o. female who presents via audio/video conferencing for a telehealth visit today.    This is a 64yo female with a history of OSA, HTN and obesity.  She is doing well with her CPAP device and thinks that she has gotten used to it.  She tolerates the mask and feels the pressure is adequate.  Since going on CPAP she feels rested in  the am and has no significant daytime sleepiness.  She denies any significant mouth or nasal dryness or nasal congestion.  She does not think that he snores.     The patient does not have symptoms concerning for COVID-19 infection (fever, chills, cough, or new shortness of breath).    Prior CV studies:   The following studies were reviewed today:  PAP compliance download  Past Medical History:  Diagnosis Date  . Arthritis   . Atrial flutter (HCC) 01/05/2013  . CAD (coronary artery disease) 07/01/2019  . Hypertension, essential, benign 01/11/2011  . OSA on CPAP 01/05/2013  . Overweight   . Paroxysmal atrial fibrillation (HCC)   . Ulcerative colitis [facility])    Past Surgical History:  Procedure Laterality Date  . ATRIAL FIBRILLATION ABLATION N/A 06/30/2019   Procedure: ATRIAL FIBRILLATION ABLATION;  Surgeon: [name], MD;  Location: [facility] INVAS[facility] CV LAB;  Service: Cardiovascular;  Laterality: N/A;  . COLON SURGERY  2004   colon resection     Current Meds  Medication Sig  . apixaban (ELIQUIS) 5 MG TABS tablet Take 1 tablet (5 mg total) by mouth 2 (two) times daily.  Marland Kitchen diltiazem (CARDIZEM) 30 MG tablet Take 1 tablet every 4 hours AS NEEDED for afib heart rate >100  . FIBER PO Take 4 capsules by mouth at bedtime.  Marland Kitchen losartan-hydrochlorothiazide (HYZAAR) 100-12.5 MG tablet Take 1 tablet by mouth daily.  . Mesalamine (ASACOL HD) 800 MG TBEC Take 800 mg by mouth  daily.   . metoprolol succinate (TOPROL-XL) 25 MG 24 hr tablet Take 1 tablet (25 mg total) by mouth daily. Take with or immediately following a meal.  . Multiple Vitamin (MULTIVITAMIN WITH MINERALS) TABS tablet Take 1 tablet by mouth daily. Centrum Silver  . rosuvastatin (CRESTOR) 20 MG tablet TAKE 1 TABLET BY MOUTH EVERY DAY     Allergies:   Penicillin g, Penicillins, Meloxicam, and Latex   Social History   Tobacco Use  . Smoking status: Never Smoker  . Smokeless tobacco: Never Used  Vaping Use  . Vaping Use: Never used   Substance Use Topics  . Alcohol use: No  . Drug use: No     Family Hx: The patient's family history includes Alzheimer's disease in her mother; Atrial fibrillation in her father; CAD (age of onset: 62) in her brother; Healthy in her brother and sister; Heart disease in her brother and mother; Other in her brother; Stroke in her father.  ROS:   Please see the history of present illness.     All other systems reviewed and are negative.   Labs/Other Tests and Data Reviewed:    Recent Labs: 12/29/2018: ALT 18 06/22/2019: BUN 15; Creatinine, Ser 1.01; Hemoglobin 15.6; Platelets 248; Potassium 4.0; Sodium 140   Recent Lipid Panel Lab Results  Component Value Date/Time   CHOL 157 05/11/2019 08:45 AM   TRIG 84 05/11/2019 08:45 AM   HDL 70 05/11/2019 08:45 AM   CHOLHDL 2.2 05/11/2019 08:45 AM   CHOLHDL 3.3 04/10/2010 03:40 AM   LDLCALC 71 05/11/2019 08:45 AM    Wt Readings from Last 3 Encounters:  09/28/19 (!) 240 lb (108.9 kg)  07/28/19 245 lb 6.4 oz (111.3 kg)  06/30/19 240 lb (108.9 kg)     Objective:    Vital Signs:  BP (!) 140/90   Pulse 72   Ht 5\' 5"  (1.651 m)   Wt (!) 240 lb (108.9 kg)   BMI 39.94 kg/m    CONSTITUTIONAL:  Well nourished, well developed female in no acute distress.  EYES: anicteric MOUTH: oral mucosa is pink RESPIRATORY: Normal respiratory effort, symmetric expansion CARDIOVASCULAR: No peripheral edema SKIN: No rash, lesions or ulcers MUSCULOSKELETAL: no digital cyanosis NEURO: Cranial Nerves II-XII grossly intact, moves all extremities PSYCH: Intact judgement and insight.  A&O x 3, Mood/affect appropriate   ASSESSMENT & PLAN:    1.  OSA -The patient is tolerating PAP therapy well without any problems. The PAP download was reviewed today and showed an AHI of 1.3/hr on 14 cm H2O with 88% compliance in using more than 4 hours nightly.  The patient has been using and benefiting from PAP use and will continue to benefit from therapy.  -her device  is 65 years old and she still has to use a SD card to get a download -I will order her a new CPAP at 14cm H2o with heated humidity and mask of choice and supplies  2.  HTN -BP borderline controlled on exam -continue Toprol XL 25mg  daily and Hyzaar 100-12.5mg  daily -I have asked her to check her BP daily for a week and call with results  3. Obesity -I have encouraged her to get into a routine exercise program and cut back on carbs and portions.   COVID-19 Education: The signs and symptoms of COVID-19 were discussed with the patient and how to seek care for testing (follow up with PCP or arrange E-visit).  The importance of social distancing was discussed today.  Patient  Risk:   After full review of this patient's clinical status, I feel that they are at least moderate risk at this time.  Time:   Today, I have spent 20 minutes on telemedicine discussing medical problems including OSA, HTN, Obesity and reviewing patient's chart including PAP compliance download.  Medication Adjustments/Labs and Tests Ordered: Current medicines are reviewed at length with the patient today.  Concerns regarding medicines are outlined above.  Tests Ordered: No orders of the defined types were placed in this encounter.  Medication Changes: No orders of the defined types were placed in this encounter.   Disposition:  Follow up in 1 year(s)  Signed, Armanda Magic, MD  09/28/2019 9:33 AM    [facility]

## 2019-09-28 ENCOUNTER — Other Ambulatory Visit: Payer: Self-pay

## 2019-09-28 ENCOUNTER — Telehealth (INDEPENDENT_AMBULATORY_CARE_PROVIDER_SITE_OTHER): Payer: Medicare Other | Admitting: Cardiology

## 2019-09-28 ENCOUNTER — Encounter: Payer: Self-pay | Admitting: Cardiology

## 2019-09-28 ENCOUNTER — Telehealth: Payer: Self-pay | Admitting: *Deleted

## 2019-09-28 VITALS — BP 140/90 | HR 72 | Ht 65.0 in | Wt 240.0 lb

## 2019-09-28 DIAGNOSIS — E669 Obesity, unspecified: Secondary | ICD-10-CM

## 2019-09-28 DIAGNOSIS — G4733 Obstructive sleep apnea (adult) (pediatric): Secondary | ICD-10-CM

## 2019-09-28 DIAGNOSIS — Z9989 Dependence on other enabling machines and devices: Secondary | ICD-10-CM

## 2019-09-28 DIAGNOSIS — I10 Essential (primary) hypertension: Secondary | ICD-10-CM | POA: Diagnosis not present

## 2019-09-28 NOTE — Telephone Encounter (Addendum)
Order placed to [facility] via community message.  Upon patient request DME selection is [facility] Patient understands she will be contacted by [facility] to set up her cpap. Patient understands to call if [facility] does not contact her with new setup in a timely manner. Patient understands they will be called once confirmation has been received from [facility] that they have received their new machine to schedule 10 week follow up appointment.  [facility] notified of new cpap order  Please add to airview Patient was grateful for the call and thanked me.

## 2019-09-28 NOTE — Telephone Encounter (Signed)
-----   Message from Quintella Reichert, MD sent at 09/28/2019  9:41 AM EDT ----- Please order new CPAP at 14cm H2o with heated humidity and mask of choice and supplies.  She will need followup in 8 weeks

## 2019-10-02 ENCOUNTER — Telehealth: Payer: Self-pay | Admitting: *Deleted

## 2019-10-02 NOTE — Telephone Encounter (Signed)
Informed patient of compliance results and verbalized understanding was indicated. Pt is aware and agreeable to normal results.

## 2019-10-02 NOTE — Telephone Encounter (Signed)
-----   Message from Traci R Turner, MD sent at 09/28/2019  2:09 PM EDT ----- Good AHI and compliance.  Continue current PAP settings. 

## 2019-10-05 ENCOUNTER — Encounter: Payer: Self-pay | Admitting: Internal Medicine

## 2019-10-05 ENCOUNTER — Ambulatory Visit (INDEPENDENT_AMBULATORY_CARE_PROVIDER_SITE_OTHER): Payer: Medicare Other | Admitting: Internal Medicine

## 2019-10-05 ENCOUNTER — Other Ambulatory Visit: Payer: Self-pay

## 2019-10-05 VITALS — BP 94/68 | HR 75 | Ht 65.0 in | Wt 246.0 lb

## 2019-10-05 DIAGNOSIS — I10 Essential (primary) hypertension: Secondary | ICD-10-CM | POA: Diagnosis not present

## 2019-10-05 DIAGNOSIS — I4819 Other persistent atrial fibrillation: Secondary | ICD-10-CM

## 2019-10-05 DIAGNOSIS — Z9989 Dependence on other enabling machines and devices: Secondary | ICD-10-CM

## 2019-10-05 DIAGNOSIS — G4733 Obstructive sleep apnea (adult) (pediatric): Secondary | ICD-10-CM

## 2019-10-05 NOTE — Patient Instructions (Addendum)
Medication Instructions:  Your physician recommends that you continue on your current medications as directed. Please refer to the Current Medication list given to you today.  *If you need a refill on your cardiac medications before your next appointment, please call your pharmacy*  Lab Work: None ordered.  If you have labs (blood work) drawn today and your tests are completely normal, you will receive your results only by: Marland Kitchen MyChart Message (if you have MyChart) OR . A paper copy in the mail If you have any lab test that is abnormal or we need to change your treatment, we will call you to review the results.  Testing/Procedures: None ordered.  Follow-Up: At [facility], you and your health needs are our priority.  As part of our continuing mission to provide you with exceptional heart care, we have created designated Provider Care Teams.  These Care Teams include your primary Cardiologist (physician) and Advanced Practice Providers (APPs -  Physician Assistants and Nurse Practitioners) who all work together to provide you with the care you need, when you need it.  We recommend signing up for the patient portal called "MyChart".  Sign up information is provided on this After Visit Summary.  MyChart is used to connect with patients for Virtual Visits (Telemedicine).  Patients are able to view lab/test results, encounter notes, upcoming appointments, etc.  Non-urgent messages can be sent to your provider as well.   To learn more about what you can do with MyChart, go to [email or web address].    Your next appointment:   Your physician wants you to follow-up in: 01/04/2020 at 9:45 at [address] with Dr. Johney Frame.   Other Instructions:

## 2019-10-05 NOTE — Progress Notes (Signed)
   PCP: Merri Brunette, MD Primary Cardiologist: Dr Lutricia Feil is a 64 y.o. female who presents today for routine electrophysiology followup.  Since his recent afib ablation, the patient reports doing very well.  she denies procedure related complications and is pleased with the results of the procedure. She does have frequent palpitations which she attributes to afib.  Today, she denies symptoms of chest pain, shortness of breath,  lower extremity edema, dizziness, presyncope, or syncope.  The patient is otherwise without complaint today.   Past Medical History:  Diagnosis Date  . Arthritis   . Atrial flutter (HCC) 01/05/2013  . CAD (coronary artery disease) 07/01/2019  . Hypertension, essential, benign 01/11/2011  . OSA on CPAP 01/05/2013  . Overweight   . Paroxysmal atrial fibrillation (HCC)   . Ulcerative colitis [facility])    Past Surgical History:  Procedure Laterality Date  . ATRIAL FIBRILLATION ABLATION N/A 06/30/2019   Procedure: ATRIAL FIBRILLATION ABLATION;  Surgeon: [name], MD;  [facility];  Service: Cardiovascular;  Laterality: N/A;  . COLON SURGERY  2004   colon resection    ROS- all systems are personally reviewed and negatives except as per HPI above  Current Outpatient Medications  Medication Sig Dispense Refill  . apixaban (ELIQUIS) 5 MG TABS tablet Take 1 tablet (5 mg total) by mouth 2 (two) times daily. 180 tablet 1  . diltiazem (CARDIZEM) 30 MG tablet Take 1 tablet every 4 hours AS NEEDED for afib heart rate >100 45 tablet 1  . FIBER PO Take 4 capsules by mouth at bedtime.    Marland Kitchen losartan-hydrochlorothiazide (HYZAAR) 100-12.5 MG tablet Take 1 tablet by mouth daily. 90 tablet 3  . Mesalamine (ASACOL HD) 800 MG TBEC Take 800 mg by mouth daily.     . metoprolol succinate (TOPROL-XL) 25 MG 24 hr tablet Take 1 tablet (25 mg total) by mouth daily. Take with or immediately following a meal. 540 tablet 3  . Multiple Vitamin (MULTIVITAMIN  WITH MINERALS) TABS tablet Take 1 tablet by mouth daily. Centrum Silver    . rosuvastatin (CRESTOR) 20 MG tablet TAKE 1 TABLET BY MOUTH EVERY DAY 90 tablet 2   No current facility-administered medications for this visit.    Physical Exam: Vitals:   10/05/19 0839  BP: 94/68  Pulse: 75  SpO2: 99%  Weight: (!) 246 lb (111.6 kg)  Height: 5\' 5"  (1.651 m)    GEN- The patient is well appearing, alert and oriented x 3 today.   Head- normocephalic, atraumatic Eyes-  Sclera clear, conjunctiva pink Ears- hearing intact Oropharynx- clear Lungs- Clear to ausculation bilaterally, normal work of breathing Heart- Regular rate and rhythm with frequent ectopy, no murmurs, rubs or gallops, PMI not laterally displaced GI- soft, NT, ND, + BS Extremities- no clubbing, cyanosis, or edema  EKG tracing ordered today is personally reviewed and shows sinus with PACs, first degree AV block  Assessment and Plan:  1. Persistent  atrial fibrillation/ atrial flutter Doing well s/p ablation chads2vasc score is 3.  Continue eliquis  2. Obesity Body mass index is 40.94 kg/m. Lifestyle modification is advised  3. OSA Follows with Dr Uses CPAP  4. HTN BP is low today. I have advised that she follow her BP closely at home No medicine changes today Adequate hydration advised  Return to see me in 3 months  Mayford Knife MD, [facility] 10/05/2019 8:54 AM

## 2019-10-15 ENCOUNTER — Other Ambulatory Visit: Payer: Self-pay | Admitting: Interventional Cardiology

## 2019-11-02 ENCOUNTER — Other Ambulatory Visit: Payer: Self-pay | Admitting: Family Medicine

## 2019-11-02 DIAGNOSIS — Z1231 Encounter for screening mammogram for malignant neoplasm of breast: Secondary | ICD-10-CM

## 2019-11-04 NOTE — Telephone Encounter (Signed)
Patient has a 10 week follow up appointment scheduled for 11/11/19. °Patient understands she needs to keep this appointment for insurance compliance. °Patient was grateful for the call and thanked me.  °

## 2019-11-10 NOTE — Progress Notes (Signed)
Virtual Visit via Video Note   This visit type was conducted due to national recommendations for restrictions regarding the COVID-19 Pandemic (e.g. social distancing) in an effort to limit this patient's exposure and mitigate transmission in our community.  Due to her co-morbid illnesses, this patient is at least at moderate risk for complications without adequate follow up.  This format is felt to be most appropriate for this patient at this time.  All issues noted in this document were discussed and addressed.  A limited physical exam was performed with this format.  Please refer to the patient's chart for her consent to telehealth for [facility].  Evaluation Performed:  Follow-up visit  This visit type was conducted due to national recommendations for restrictions regarding the COVID-19 Pandemic (e.g. social distancing).  This format is felt to be most appropriate for this patient at this time.  All issues noted in this document were discussed and addressed.  No physical exam was performed (except for noted visual exam findings with Video Visits).  Please refer to the patient's chart (MyChart message for video visits and phone note for telephone visits) for the patient's consent to telehealth for [facility].  Date:  11/11/2019   ID:  Catherine Callahan, DOB [birth year], MRN [id number]  Patient Location:  Home  Provider location:   [place]  PCP:  Merri Brunette, MD  Cardiologist:  Lesleigh Noe, MD  Sleep Medicine: Armanda Magic, MD Electrophysiologist:  None   Chief Complaint:  OS   History of Present Illness:    Catherine Callahan is a 64 y.o. female who presents via audio/video conferencing for a telehealth visit today.    This is a 64yo female with a history of OSA, HTN and obesity.  At her last OV her device was 65 years old and she wanted a new one.  She is now here for followup after getting a new ResMed CPAP per insurance requirements to document compliance.     She is doing well with her CPAP device and thinks that she has gotten used to it.  She tolerates the mask and feels the pressure is adequate.  Since going on CPAP she feels rested in the am and has no significant daytime sleepiness.  She denies any significant mouth or nasal dryness or nasal congestion.  She does not think that he snores.    The patient does not have symptoms concerning for COVID-19 infection (fever, chills, cough, or new shortness of breath).   Prior CV studies:   The following studies were reviewed today:  PAP compliance download  Past Medical History:  Diagnosis Date  . Arthritis   . Atrial flutter (HCC) 01/05/2013  . CAD (coronary artery disease) 07/01/2019  . Hypertension, essential, benign 01/11/2011  . OSA on CPAP 01/05/2013  . Overweight   . Paroxysmal atrial fibrillation (HCC)   . Ulcerative colitis [facility])    Past Surgical History:  Procedure Laterality Date  . ATRIAL FIBRILLATION ABLATION N/A 06/30/2019   Procedure: ATRIAL FIBRILLATION ABLATION;  Surgeon: [name], MD;  [facility] CV LAB;  Service: Cardiovascular;  Laterality: N/A;  . COLON SURGERY  2004   colon resection     Current Meds  Medication Sig  . apixaban (ELIQUIS) 5 MG TABS tablet Take 1 tablet (5 mg total) by mouth 2 (two) times daily.  Marland Kitchen diltiazem (CARDIZEM) 30 MG tablet Take 1 tablet every 4 hours AS NEEDED for afib heart rate >100  . FIBER PO  Take 4 capsules by mouth at bedtime.  Marland Kitchen losartan-hydrochlorothiazide (HYZAAR) 100-12.5 MG tablet Take 1 tablet by mouth daily.   . Mesalamine (ASACOL HD) 800 MG TBEC Take 800 mg by mouth daily.   . metoprolol succinate (TOPROL-XL) 25 MG 24 hr tablet Take 1 tablet (25 mg total) by mouth daily. Take with or immediately following a meal.  . Multiple Vitamin (MULTIVITAMIN WITH MINERALS) TABS tablet Take 1 tablet by mouth daily. Centrum Silver  . rosuvastatin (CRESTOR) 20 MG tablet TAKE 1 TABLET BY MOUTH EVERY DAY  . [DISCONTINUED]  hydrochlorothiazide (HYDRODIURIL) 25 MG tablet Take 25 mg by mouth daily.  . [DISCONTINUED] lisinopril (ZESTRIL) 10 MG tablet Take 10 mg by mouth daily.     Allergies:   Penicillin g, Penicillins, Meloxicam, and Latex   Social History   Tobacco Use  . Smoking status: Never Smoker  . Smokeless tobacco: Never Used  Vaping Use  . Vaping Use: Never used  Substance Use Topics  . Alcohol use: No  . Drug use: No     Family Hx: The patient's family history includes Alzheimer's disease in her mother; Atrial fibrillation in her father; CAD (age of onset: 82) in her brother; Healthy in her brother and sister; Heart disease in her brother and mother; Other in her brother; Stroke in her father.  ROS:   Please see the history of present illness.     All other systems reviewed and are negative.   Labs/Other Tests and Data Reviewed:    Recent Labs: 12/29/2018: ALT 18 06/22/2019: BUN 15; Creatinine, Ser 1.01; Hemoglobin 15.6; Platelets 248; Potassium 4.0; Sodium 140   Recent Lipid Panel Lab Results  Component Value Date/Time   CHOL 157 05/11/2019 08:45 AM   TRIG 84 05/11/2019 08:45 AM   HDL 70 05/11/2019 08:45 AM   CHOLHDL 2.2 05/11/2019 08:45 AM   CHOLHDL 3.3 04/10/2010 03:40 AM   LDLCALC 71 05/11/2019 08:45 AM    Wt Readings from Last 3 Encounters:  11/11/19 242 lb (109.8 kg)  10/05/19 (!) 246 lb (111.6 kg)  09/28/19 (!) 240 lb (108.9 kg)     Objective:    Vital Signs:  BP 117/73   Ht 5\' 6"  (1.676 m)   Wt 242 lb (109.8 kg)   BMI 39.06 kg/m    ASSESSMENT & PLAN:    1.  OSA -  The PAP download was reviewed today and showed an AHI of 1.4/hr on 14 cm H2O with 87% compliance in using more than 4 hours nightly.  The patient has been using and benefiting from PAP use and will continue to benefit from therapy.  -she would like her ramp turned off because it makes a noise - I have told her that she can adjust this on her device herself  2.  HTN -BP controlled -continue Toprol  XL 25mg  daily   3. Obesity -I have encouraged her to get into a routine exercise program and cut back on carbs and portions.   COVID-19 Education: The signs and symptoms of COVID-19 were discussed with the patient and how to seek care for testing (follow up with PCP or arrange E-visit).  The importance of social distancing was discussed today.  Patient Risk:   After full review of this patient's clinical status, I feel that they are at least moderate risk at this time.  Time:   Today, I have spent 20 minutes on telemedicine discussing medical problems including OSA, HTN, Obesity and reviewing patient's chart including PAP  compliance download.  Medication Adjustments/Labs and Tests Ordered: Current medicines are reviewed at length with the patient today.  Concerns regarding medicines are outlined above.  Tests Ordered: No orders of the defined types were placed in this encounter.  Medication Changes: No orders of the defined types were placed in this encounter.   Disposition:  Follow up in 1 year(s)  Signed, Armanda Magic, MD  11/11/2019 10:18 AM    [facility]

## 2019-11-11 ENCOUNTER — Telehealth (INDEPENDENT_AMBULATORY_CARE_PROVIDER_SITE_OTHER): Payer: Medicare Other | Admitting: Cardiology

## 2019-11-11 ENCOUNTER — Other Ambulatory Visit: Payer: Self-pay

## 2019-11-11 ENCOUNTER — Encounter: Payer: Self-pay | Admitting: Cardiology

## 2019-11-11 VITALS — BP 117/73 | Ht 66.0 in | Wt 242.0 lb

## 2019-11-11 DIAGNOSIS — G4733 Obstructive sleep apnea (adult) (pediatric): Secondary | ICD-10-CM

## 2019-11-11 DIAGNOSIS — I10 Essential (primary) hypertension: Secondary | ICD-10-CM | POA: Diagnosis not present

## 2019-11-11 DIAGNOSIS — E669 Obesity, unspecified: Secondary | ICD-10-CM | POA: Diagnosis not present

## 2019-11-22 NOTE — Progress Notes (Signed)
Cardiology Office Note:    Date:  11/23/2019   ID:  Catherine Callahan, DOB October 14, 1955, MRN [id number]  PCP:  Merri Brunette, MD  Cardiologist:  Lesleigh Noe, MD   Referring MD: Merri Brunette, MD   Chief Complaint  Patient presents with  . Coronary Artery Disease  . Atrial Fibrillation  . Hypertension    History of Present Illness:    Catherine Callahan is a 64 y.o. female with a hx of paroxysmal atrial fibrillation, flecainide therapy, obesity, essential hypertension, hyperlipidemia, asymptomatic CAD (CAC 548), and obstructive sleep apnea.  She is doing well.  She denies cardiovascular symptoms.  These denials including specifically chest pain, dyspnea on exertion, neurological complaints, bleeding on anticoagulation therapy, claudication, orthopnea, and peripheral edema.  She has had occasional palpitations, but they have been short-lived.  She is status post ablation earlier in the year.  Cardiac CT with morphology demonstrated coronary calcification with a calcium score greater than 500 (high risk).  Past Medical History:  Diagnosis Date  . Arthritis   . Atrial flutter (HCC) 01/05/2013  . CAD (coronary artery disease) 07/01/2019  . Hypertension, essential, benign 01/11/2011  . OSA on CPAP 01/05/2013  . Overweight   . Paroxysmal atrial fibrillation (HCC)   . Ulcerative colitis [facility])     Past Surgical History:  Procedure Laterality Date  . ATRIAL FIBRILLATION ABLATION N/A 06/30/2019   Procedure: ATRIAL FIBRILLATION ABLATION;  Surgeon: [name], MD;  Location: [facility] IN[facility] CV LAB;  Service: Cardiovascular;  Laterality: N/A;  . COLON SURGERY  2004   colon resection    Current Medications: Current Meds  Medication Sig  . apixaban (ELIQUIS) 5 MG TABS tablet Take 1 tablet (5 mg total) by mouth 2 (two) times daily.  Marland Kitchen FIBER PO Take 4 capsules by mouth at bedtime.  Marland Kitchen losartan-hydrochlorothiazide (HYZAAR) 100-12.5 MG tablet Take 1 tablet by mouth daily.   .  Mesalamine (ASACOL HD) 800 MG TBEC Take 800 mg by mouth daily.   . metoprolol succinate (TOPROL-XL) 25 MG 24 hr tablet Take 1 tablet (25 mg total) by mouth daily. Take with or immediately following a meal.  . Multiple Vitamin (MULTIVITAMIN WITH MINERALS) TABS tablet Take 1 tablet by mouth daily. Centrum Silver  . rosuvastatin (CRESTOR) 20 MG tablet TAKE 1 TABLET BY MOUTH EVERY DAY     Allergies:   Penicillin g, Penicillins, Meloxicam, and Latex   Social History   Socioeconomic History  . Marital status: Widowed    Spouse name: Not on file  . Number of children: Not on file  . Years of education: Not on file  . Highest education level: Not on file  Occupational History  . Not on file  Tobacco Use  . Smoking status: Never Smoker  . Smokeless tobacco: Never Used  Vaping Use  . Vaping Use: Never used  Substance and Sexual Activity  . Alcohol use: No  . Drug use: No  . Sexual activity: Not on file  Other Topics Concern  . Not on file  Social History Narrative   Pt lives in [place] alone.  Widowed.  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   Social Determinants of Health   Financial Resource Strain:   . Difficulty of Paying Living Expenses: Not on file  Food Insecurity:   . Worried About Programme researcher, broadcasting/film/video in the Last Year: Not on file  . Ran  Out of Food in the Last Year: Not on file  Transportation Needs:   . Lack of Transportation (Medical): Not on file  . Lack of Transportation (Non-Medical): Not on file  Physical Activity:   . Days of Exercise per Week: Not on file  . Minutes of Exercise per Session: Not on file  Stress:   . Feeling of Stress : Not on file  Social Connections:   . Frequency of Communication with Friends and Family: Not on file  . Frequency of Social Gatherings with Friends and Family: Not on file  . Attends Religious Services: Not on file  . Active Member of Clubs or  Organizations: Not on file  . Attends Banker Meetings: Not on file  . Marital Status: Not on file     Family History: The patient's family history includes Alzheimer's disease in her mother; Atrial fibrillation in her father; CAD (age of onset: 57) in her brother; Healthy in her brother and sister; Heart disease in her brother and mother; Other in her brother; Stroke in her father.  ROS:   Please see the history of present illness.    She may drink 1 to 2 glasses of wine 4 out of the 7 days of the week.  She does not drink daily.  She is getting some physical activity although it is not consistent.  All other systems reviewed and are negative.  EKGs/Labs/Other Studies Reviewed:    The following studies were reviewed today:  CARDIAC CT WITH MORPHOLOGY 06/2019: IMPRESSION: 1. There is normal pulmonary vein drainage into the left atrium.  2. The left atrial appendage is large with chicken wing morphology and no evidence for a thrombus.  3. The esophagus runs to the left from the left atrial midline and is in the proximity to the ostia of the left upper and left lower pulmonary veins.  4. Calcium score is 548 that represents 96 percentile for age/sex.   EKG:  EKG not repeated  Recent Labs: 12/29/2018: ALT 18 06/22/2019: BUN 15; Creatinine, Ser 1.01; Hemoglobin 15.6; Platelets 248; Potassium 4.0; Sodium 140  Recent Lipid Panel    Component Value Date/Time   CHOL 157 05/11/2019 0845   TRIG 84 05/11/2019 0845   HDL 70 05/11/2019 0845   CHOLHDL 2.2 05/11/2019 0845   CHOLHDL 3.3 04/10/2010 0340   VLDL 77 (H) 04/10/2010 0340   LDLCALC 71 05/11/2019 0845    Physical Exam:    VS:  BP 94/62   Pulse 76   Ht 5\' 6"  (1.676 m)   Wt 245 lb 3.2 oz (111.2 kg)   SpO2 97%   BMI 39.58 kg/m     Wt Readings from Last 3 Encounters:  11/23/19 245 lb 3.2 oz (111.2 kg)  11/11/19 242 lb (109.8 kg)  10/05/19 (!) 246 lb (111.6 kg)     GEN: Obesity. No acute  distress HEENT: Normal NECK: No JVD. LYMPHATICS: No lymphadenopathy CARDIAC:  RRR without murmur, gallop, or edema. VASCULAR:  Normal Pulses. No bruits. RESPIRATORY:  Clear to auscultation without rales, wheezing or rhonchi  ABDOMEN: Soft, non-tender, non-distended, No pulsatile mass, MUSCULOSKELETAL: No deformity  SKIN: Warm and dry NEUROLOGIC:  Alert and oriented x 3 PSYCHIATRIC:  Normal affect   ASSESSMENT:    1. Paroxysmal atrial fibrillation (HCC)   2. CAD in native artery   3. Hyperlipidemia LDL goal <70   4. Benign essential HTN   5. OSA on CPAP   6. Secondary hypercoagulable state (HCC)  7. Encounter for monitoring flecainide therapy   8. Educated about COVID-19 virus infection    PLAN:    In order of problems listed above:  1. Status post ablation.  Continue Eliquis.  Monitor for bleeding. 2. Aggressive primary prevention as noted below.  Strongly encouraged to increase physical activity and to be compliant with CPAP. 3. LDL less than 70 is goal.  Continue high intensity statin therapy in the form of rosuvastatin 20 mg/day.  Last LDL was 71. 4. Target blood pressure 130/80 mmHg or less.  Current pressures are borderline low.  During the day she states her blood pressures climbed into the 120 mmHg range.  She is not symptomatic.  If blood pressures continue to frequently range less than 100 mmHg systolic, may remove or decrease the intensity of diuretic therapy or decrease losartan from 100/12.5 mg a day to 50/12.5 mg/day. 5. Continue Eliquis 6. Flecainide failed and has been discontinued after ablation. 7. She has been vaccinated and is practicing social mitigation.  Overall education and awareness concerning primary/secondary risk prevention was discussed in detail: LDL less than 70, hemoglobin A1c less than 7, blood pressure target less than 130/80 mmHg, >150 minutes of moderate aerobic activity per week, avoidance of smoking, weight control (via diet and exercise),  and continued surveillance/management of/for obstructive sleep apnea.  59-month follow-up.   Medication Adjustments/Labs and Tests Ordered: Current medicines are reviewed at length with the patient today.  Concerns regarding medicines are outlined above.  No orders of the defined types were placed in this encounter.  No orders of the defined types were placed in this encounter.   Patient Instructions  Medication Instructions:  Your physician recommends that you continue on your current medications as directed. Please refer to the Current Medication list given to you today.  *If you need a refill on your cardiac medications before your next appointment, please call your pharmacy*   Lab Work: None If you have labs (blood work) drawn today and your tests are completely normal, you will receive your results only by: Marland Kitchen MyChart Message (if you have MyChart) OR . A paper copy in the mail If you have any lab test that is abnormal or we need to change your treatment, we will call you to review the results.   Testing/Procedures: None   Follow-Up: At [facility], you and your health needs are our priority.  As part of our continuing mission to provide you with exceptional heart care, we have created designated Provider Care Teams.  These Care Teams include your primary Cardiologist (physician) and Advanced Practice Providers (APPs -  Physician Assistants and Nurse Practitioners) who all work together to provide you with the care you need, when you need it.  We recommend signing up for the patient portal called "MyChart".  Sign up information is provided on this After Visit Summary.  MyChart is used to connect with patients for Virtual Visits (Telemedicine).  Patients are able to view lab/test results, encounter notes, upcoming appointments, etc.  Non-urgent messages can be sent to your provider as well.   To learn more about what you can do with MyChart, go to [email or web address].     Your next appointment:   6 month(s)  The format for your next appointment:   In Person  Provider:   You may see Lesleigh Noe, MD or one of the following Advanced Practice Providers on your designated Care Team:    Norma Fredrickson, NP  Nada Boozer, NP  Noreene Larsson  Julien GirtMcDaniel, NP    Other Instructions      Signed, Lesleigh NoeHenry W Cahlil Sattar III, MD  11/23/2019 11:24 AM    [facility]

## 2019-11-23 ENCOUNTER — Encounter: Payer: Self-pay | Admitting: Interventional Cardiology

## 2019-11-23 ENCOUNTER — Other Ambulatory Visit: Payer: Self-pay

## 2019-11-23 ENCOUNTER — Ambulatory Visit (INDEPENDENT_AMBULATORY_CARE_PROVIDER_SITE_OTHER): Payer: Medicare Other | Admitting: Interventional Cardiology

## 2019-11-23 VITALS — BP 94/62 | HR 76 | Ht 66.0 in | Wt 245.2 lb

## 2019-11-23 DIAGNOSIS — I10 Essential (primary) hypertension: Secondary | ICD-10-CM

## 2019-11-23 DIAGNOSIS — E785 Hyperlipidemia, unspecified: Secondary | ICD-10-CM | POA: Diagnosis not present

## 2019-11-23 DIAGNOSIS — I480 Paroxysmal atrial fibrillation: Secondary | ICD-10-CM

## 2019-11-23 DIAGNOSIS — G4733 Obstructive sleep apnea (adult) (pediatric): Secondary | ICD-10-CM

## 2019-11-23 DIAGNOSIS — I2510 Atherosclerotic heart disease of native coronary artery without angina pectoris: Secondary | ICD-10-CM | POA: Diagnosis not present

## 2019-11-23 DIAGNOSIS — Z5181 Encounter for therapeutic drug level monitoring: Secondary | ICD-10-CM

## 2019-11-23 DIAGNOSIS — Z79899 Other long term (current) drug therapy: Secondary | ICD-10-CM

## 2019-11-23 DIAGNOSIS — Z7189 Other specified counseling: Secondary | ICD-10-CM

## 2019-11-23 DIAGNOSIS — Z9989 Dependence on other enabling machines and devices: Secondary | ICD-10-CM

## 2019-11-23 DIAGNOSIS — D6869 Other thrombophilia: Secondary | ICD-10-CM

## 2019-11-23 NOTE — Patient Instructions (Signed)

## 2019-11-25 ENCOUNTER — Telehealth: Payer: Self-pay | Admitting: *Deleted

## 2019-11-25 MED ORDER — APIXABAN 5 MG PO TABS: 5.0000 mg | ORAL_TABLET | Freq: Two times a day (BID) | ORAL | 1 refills | Status: DC

## 2019-11-25 NOTE — Telephone Encounter (Signed)
° °  [facility] Pre-operative Risk Assessment    HEARTCARE STAFF: - Please ensure there is not already an duplicate clearance open for this procedure. - Under Visit Info/Reason for Call, type in Other and utilize the format Clearance MM/DD/YY or Clearance TBD. Do not use dashes or single digits. - If request is for dental extraction, please clarify the # of teeth to be extracted.  Request for surgical clearance:  1. What type of surgery is being performed? COLONOSCOPY  2. When is this surgery scheduled? 01/13/20   3. What type of clearance is required (medical clearance vs. Pharmacy clearance to hold med vs. Both)? BOTH  4. Are there any medications that need to be held prior to surgery and how long? ELIQUIS x1-2 DAYS PRIOR TOPROCEDURE  5. Practice name and name of physician performing surgery? [facility]; DR. Alessandra Bevels   6. What is the office phone number? [phone]   7.   What is the office fax number? [phone]  8.   Anesthesia type (None, local, MAC, general) ? PROPOFOL   Julaine Hua 11/25/2019, 2:58 PM  _________________________________________________________________   (provider comments below)

## 2019-11-25 NOTE — Telephone Encounter (Signed)
Dr. Katrinka Blazing, can you comment on surgical clearance? You saw the patient 2 days ago. Please forward your response to [facility] PREOP.   Thank you

## 2019-11-25 NOTE — Telephone Encounter (Signed)
   Primary Cardiologist: Lesleigh Noe, MD  Chart reviewed as part of pre-operative protocol coverage. Patient was contacted 11/25/2019 in reference to pre-operative risk assessment for pending surgery as outlined below.  Catherine Callahan was last seen on 11/23/19 by Dr. Katrinka Blazing for PAF, flecainide therapy, HTN, asymptomatic CAD and OSA .  Since that day, Catherine Callahan has done well.  Therefore, based on ACC/AHA guidelines, the patient would be at acceptable risk for the planned procedure without further cardiovascular testing.   She may hold her Eliquis for 1-2 days prior to procedure and resume post procedure when felt safe.  The patient was advised that if she develops new symptoms prior to surgery to contact our office to arrange for a follow-up visit, and she verbalized understanding.  I will route this recommendation to the requesting party via Epic fax function and remove from pre-op pool. Please call with questions.  Nada Boozer, NP 11/25/2019, 3:53 PM

## 2019-11-25 NOTE — Telephone Encounter (Signed)
Kat to proceed with colonoscopy. Hold anticoagulation x 2 days.

## 2019-11-25 NOTE — Telephone Encounter (Signed)
Patient with diagnosis of afib on Eliquis for anticoagulation.    Procedure: colonoscopy Date of procedure: 01/13/20  CHADS2-VASc score of 4 (age, sex, HTN, CAD). Underwent afib ablation 06/30/19.  CrCl 62mL/min using adjusted body weight Platelet count 248K  Per office protocol, patient can hold Eliquis for 1-2 days prior to procedure as requested.

## 2019-11-25 NOTE — Telephone Encounter (Signed)
Pharm can you please address eliquis thanks 

## 2019-11-26 NOTE — Telephone Encounter (Signed)
   Primary Cardiologist: Lesleigh Noe, MD  Chart reviewed as part of pre-operative protocol coverage. Given past medical history and time since last visit, based on ACC/AHA guidelines, Catherine Callahan would be at acceptable risk for the planned procedure without further cardiovascular testing.   She may hold her Eliquis 1-2 days prior to her procedure.  Please resume as soon as hemostasis is achieved.  I will route this recommendation to the requesting party via Epic fax function and remove from pre-op pool.  Please call with questions.  Thomasene Ripple. Shenia Alan NP-C    11/26/2019, 8:11 AM [facility] [address] Office [phone] Fax [phone]

## 2019-11-30 ENCOUNTER — Ambulatory Visit
Admission: RE | Admit: 2019-11-30 | Discharge: 2019-11-30 | Disposition: A | Discharge status: Home / Self Care | Payer: Medicare Other | Source: Ambulatory Visit | Attending: Family Medicine | Admitting: Family Medicine

## 2019-11-30 ENCOUNTER — Other Ambulatory Visit: Payer: Self-pay

## 2019-11-30 DIAGNOSIS — Z1231 Encounter for screening mammogram for malignant neoplasm of breast: Secondary | ICD-10-CM

## 2020-01-04 ENCOUNTER — Other Ambulatory Visit: Payer: Self-pay | Admitting: Family Medicine

## 2020-01-04 ENCOUNTER — Ambulatory Visit: Payer: Medicare Other | Admitting: Internal Medicine

## 2020-01-04 DIAGNOSIS — E2839 Other primary ovarian failure: Secondary | ICD-10-CM

## 2020-01-11 ENCOUNTER — Ambulatory Visit (INDEPENDENT_AMBULATORY_CARE_PROVIDER_SITE_OTHER): Payer: Medicare Other | Admitting: Internal Medicine

## 2020-01-11 ENCOUNTER — Other Ambulatory Visit: Payer: Self-pay

## 2020-01-11 ENCOUNTER — Encounter: Payer: Self-pay | Admitting: Internal Medicine

## 2020-01-11 VITALS — BP 88/60 | HR 77 | Ht 66.0 in | Wt 253.0 lb

## 2020-01-11 DIAGNOSIS — I4819 Other persistent atrial fibrillation: Secondary | ICD-10-CM

## 2020-01-11 DIAGNOSIS — I2510 Atherosclerotic heart disease of native coronary artery without angina pectoris: Secondary | ICD-10-CM | POA: Diagnosis not present

## 2020-01-11 DIAGNOSIS — E785 Hyperlipidemia, unspecified: Secondary | ICD-10-CM

## 2020-01-11 DIAGNOSIS — D6869 Other thrombophilia: Secondary | ICD-10-CM

## 2020-01-11 MED ORDER — LOSARTAN POTASSIUM 100 MG PO TABS: 100.0000 mg | ORAL_TABLET | Freq: Every day | ORAL | 3 refills | Status: DC

## 2020-01-11 NOTE — Patient Instructions (Addendum)
Medication Instructions:  1 Stop Losartan potassium  2 Start Losartan 100 mg daily  *If you need a refill on your cardiac medications before your next appointment, please call your pharmacy*  Lab Work: None ordered.  If you have labs (blood work) drawn today and your tests are completely normal, you will receive your results only by: Marland Kitchen MyChart Message (if you have MyChart) OR . A paper copy in the mail If you have any lab test that is abnormal or we need to change your treatment, we will call you to review the results.  Testing/Procedures: None ordered.  Follow-Up: At [facility], you and your health needs are our priority.  As part of our continuing mission to provide you with exceptional heart care, we have created designated Provider Care Teams.  These Care Teams include your primary Cardiologist (physician) and Advanced Practice Providers (APPs -  Physician Assistants and Nurse Practitioners) who all work together to provide you with the care you need, when you need it.  We recommend signing up for the patient portal called "MyChart".  Sign up information is provided on this After Visit Summary.  MyChart is used to connect with patients for Virtual Visits (Telemedicine).  Patients are able to view lab/test results, encounter notes, upcoming appointments, etc.  Non-urgent messages can be sent to your provider as well.   To learn more about what you can do with MyChart, go to [email or web address].    Your next appointment:   Your physician wants you to follow-up in: 1 year with Dr. Johney Frame. You will receive a reminder letter in the mail two months in advance. If you don't receive a letter, please call our office to schedule the follow-up appointment.    Other Instructions:

## 2020-01-11 NOTE — Progress Notes (Signed)
° °  PCP: Merri Brunette, MD Primary Cardiologist: Dr Katrinka Blazing Primary EP: Dr Lavon Paganini is a 64 y.o. female who presents today for routine electrophysiology followup.  Since last being seen in our clinic, the patient reports doing very well.  Today, she denies symptoms of palpitations, chest pain, shortness of breath,  lower extremity edema, dizziness, presyncope, or syncope.  The patient is otherwise without complaint today.   Past Medical History:  Diagnosis Date   Arthritis    Atrial flutter (HCC) 01/05/2013   CAD (coronary artery disease) 07/01/2019   Hypertension, essential, benign 01/11/2011   OSA on CPAP 01/05/2013   Overweight    Paroxysmal atrial fibrillation (HCC)    Ulcerative colitis (HCC)    Past Surgical History:  Procedure Laterality Date   ATRIAL FIBRILLATION ABLATION N/A 06/30/2019   Procedure: ATRIAL FIBRILLATION ABLATION;  Surgeon: [name], MD;  Location: [facility];  Service: Cardiovascular;  Laterality: N/A;   COLON SURGERY  2004   colon resection    ROS- all systems are reviewed and negatives except as per HPI above  Current Outpatient Medications  Medication Sig Dispense Refill   apixaban (ELIQUIS) 5 MG TABS tablet Take 1 tablet (5 mg total) by mouth 2 (two) times daily. 180 tablet 1   FIBER PO Take 4 capsules by mouth at bedtime.     losartan-hydrochlorothiazide (HYZAAR) 100-12.5 MG tablet Take 1 tablet by mouth daily.      Mesalamine (ASACOL HD) 800 MG TBEC Take 800 mg by mouth daily.      metoprolol succinate (TOPROL-XL) 25 MG 24 hr tablet Take 1 tablet (25 mg total) by mouth daily. Take with or immediately following a meal. 540 tablet 3   Multiple Vitamin (MULTIVITAMIN WITH MINERALS) TABS tablet Take 1 tablet by mouth daily. Centrum Silver     rosuvastatin (CRESTOR) 20 MG tablet TAKE 1 TABLET BY MOUTH EVERY DAY 90 tablet 2   No current facility-administered medications for this visit.    Physical Exam: Vitals:    01/11/20 1054  BP: (!) 88/60  Pulse: 77  SpO2: 97%  Weight: 253 lb (114.8 kg)  Height: 5\' 6"  (1.676 m)    GEN- The patient is well appearing, alert and oriented x 3 today.   Head- normocephalic, atraumatic Eyes-  Sclera clear, conjunctiva pink Ears- hearing intact Oropharynx- clear Lungs- Clear to ausculation bilaterally, normal work of breathing Heart- Regular rate and rhythm, no murmurs, rubs or gallops, PMI not laterally displaced GI- soft, NT, ND, + BS Extremities- no clubbing, cyanosis, or edema  Wt Readings from Last 3 Encounters:  01/11/20 253 lb (114.8 kg)  11/23/19 245 lb 3.2 oz (111.2 kg)  11/11/19 242 lb (109.8 kg)    EKG tracing ordered today is personally reviewed and shows sinus rhythm 77 bpm, PR 264 msec,   Assessment and Plan:  1. Persistent afib/ atrial flutter Well controlled post ablation chads2vasc score is 3.  Continue eliquis She takes metoprolol prn for episodes of tachycardia  2. OSA Uses CPAP  3. Obesity Body mass index is 40.84 kg/m. Lifestyle modification is advised  4. HTN BP is low today Stop hctz  5. HL Continue crestor   Risks, benefits and potential toxicities for medications prescribed and/or refilled reviewed with patient today.   Follow-up with Dr 01/11/20 as scheduled I will see in a year  Katrinka Blazing MD, [facility] 01/11/2020 11:18 AM

## 2020-01-12 ENCOUNTER — Telehealth: Payer: Self-pay | Admitting: Internal Medicine

## 2020-01-12 ENCOUNTER — Telehealth: Payer: Self-pay | Admitting: *Deleted

## 2020-01-12 MED ORDER — LOSARTAN POTASSIUM 100 MG PO TABS: 100.0000 mg | ORAL_TABLET | Freq: Every day | ORAL | 3 refills | Status: DC

## 2020-01-12 NOTE — Addendum Note (Signed)
Addended by: Sampson Goon on: 01/12/2020 07:47 AM   Modules accepted: Orders

## 2020-01-12 NOTE — Telephone Encounter (Signed)
Pt c/o medication issue:  1. Name of Medication: losartan-hydrochlorothiazide (HYZAAR) 100-12.5 MG tablet [[id number]]   2. How are you currently taking this medication (dosage and times per day)? 1 tablet by mouth daily   3. Are you having a reaction (difficulty breathing--STAT)? No   4. What is your medication issue? Catherine Callahan is calling stating when she last spoke with Catherine Callahan she advised her that Catherine Callahan is wanting her to stop this medication. [name] states she does not think he does due to being an [employer] fund[employer] and reading over Catherine Callahan notes. Please advise.

## 2020-01-12 NOTE — Addendum Note (Signed)
Addended by: Sheppard Evens E on: 01/12/2020 12:46 PM   Modules accepted: Orders

## 2020-01-12 NOTE — Telephone Encounter (Signed)
Patient verbalized understanding  

## 2020-01-12 NOTE — Telephone Encounter (Signed)
Left message to call back.   Patient needs to stop her Losartan.

## 2020-01-12 NOTE — Telephone Encounter (Signed)
Verified with paperwork from Dr. Johney Frame. Stop Hyzaar, Start Losartan 100 mg daily.

## 2020-02-17 MED ORDER — HYDROCHLOROTHIAZIDE 12.5 MG PO CAPS
12.5000 mg | ORAL_CAPSULE | Freq: Every day | ORAL | 3 refills | Status: DC
Start: 2020-02-17 — End: 2020-05-19

## 2020-02-17 NOTE — Telephone Encounter (Signed)
Per MD Allred will restart her HCTZ 12.5 mg but not as a combination medication. IF her BP starts to get low again to hold this medication as needed.  Patient agreeable and verbalized understanding.

## 2020-04-07 ENCOUNTER — Other Ambulatory Visit: Payer: Medicare Other

## 2020-05-13 IMAGING — CT CT HEART MORPH/PULM VEIN W/ CM & W/O CA SCORE
2 of 9 series · 6 of 20 positions shown, 7 images · non-contrast
Comparison: None.
COMPARISON: None.
COMPARISON: None.

Addendum:
EXAM:
OVER-READ INTERPRETATION  CT CHEST

The following report is an over-read performed by radiologist Dr.
Akiy Florist [REDACTED] on 06/25/2019. This
over-read does not include interpretation of cardiac or coronary
anatomy or pathology. The coronary calcium score/coronary CTA
interpretation by the cardiologist is attached.
CLINICAL DATA: 64-year-old male with atrial fibrillation scheduled
for an ablation.
Cardiac CT/CTA
TECHNIQUE: The patient was scanned on a Siemens Somatom scanner.

[Series 10: best diast · axial · 0.39mm/px · z∈[+1128,+1192]mm · 3 of 321 slices shown, 4 images]
[im 81/321  vessel]
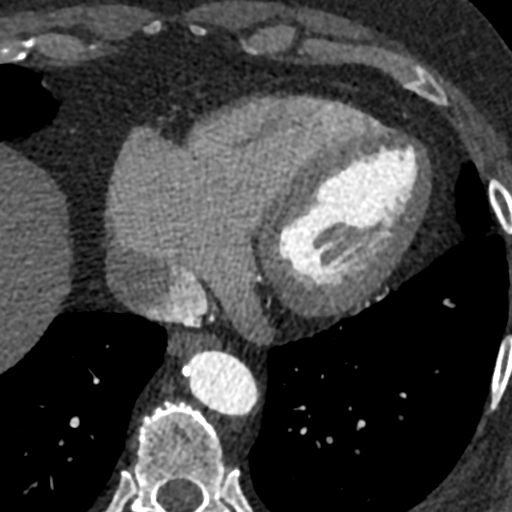
[im 81/321  lung]
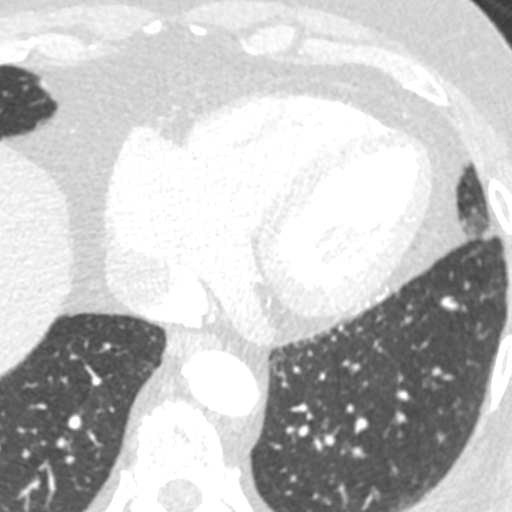
[im 161/321  vessel]
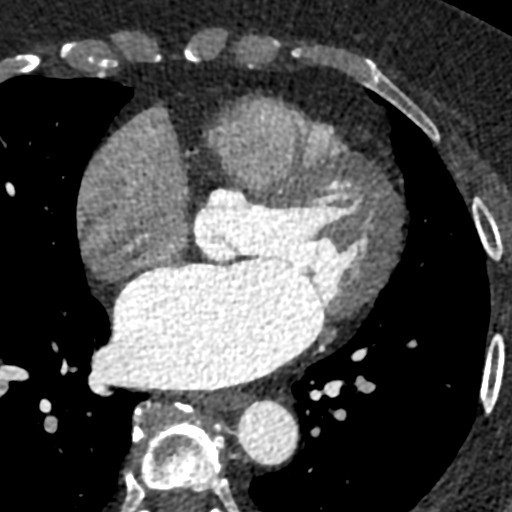
[im 241/321  vessel]
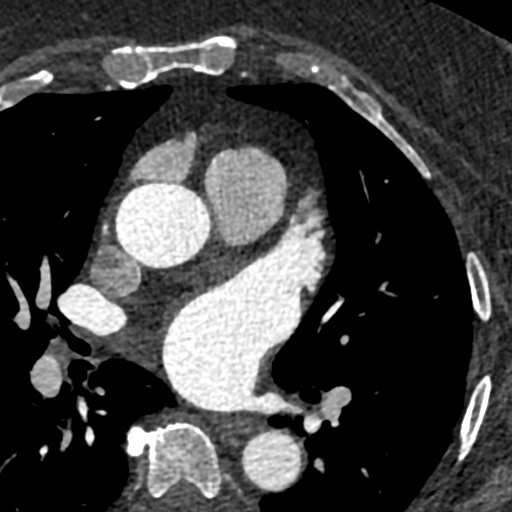

[Series 11: +300 ms · axial · 0.39mm/px · z∈[+1128,+1192]mm · 3 of 321 slices shown]
[im 81/321  vessel]
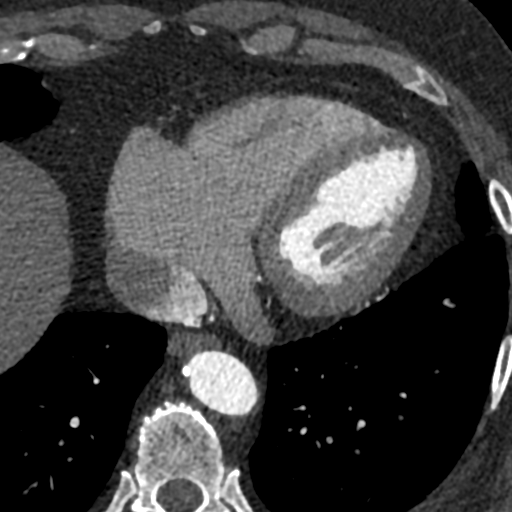
[im 161/321  vessel]
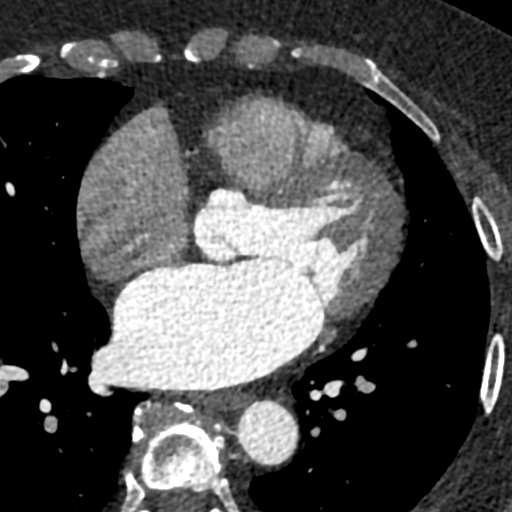
[im 241/321  vessel]
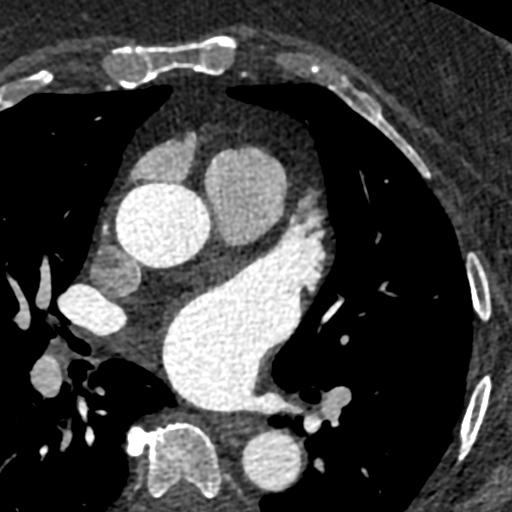

[6 of 20 positions shown; findings below may reference images not displayed]

FINDINGS: Aortic atherosclerosis. Within the visualized portions of the thorax
there are no suspicious appearing pulmonary nodules or masses, there
is no acute consolidative airspace disease, no pleural effusions, no
pneumothorax and no lymphadenopathy. Visualized portions of the
upper abdomen are unremarkable. There are no aggressive appearing
lytic or blastic lesions noted in the visualized portions of the
skeleton.
IMPRESSION: 1.  Aortic Atherosclerosis (O6FVK-V73.3).
FINDINGS: A 120 kV prospective scan was triggered in the descending thoracic
aorta at 111 HU's. Gantry rotation speed was 280 msecs and
collimation was .9 mm. No beta blockade and no NTG was given. The 3D
data set was reconstructed in 5% intervals of the 60-80 % of the R-R
cycle. Diastolic phases were analyzed on a dedicated work station
using MPR, MIP and VRT modes. The patient received 80 cc of
contrast.

There is normal pulmonary vein drainage into the left atrium (2 on
the right and 2 on the left) with ostial measurements as follows:

RUPV: 22.6 x 19.1 mm

RLPV: 20.4 x 16.8 mm

LUPV: 18.7 x 9.13 mm

LLPV: 19.8 x 13.4 mm

The left atrial appendage is large with chicken wing morphology and
no evidence for a thrombus.

The esophagus runs to the left from the left atrial midline and is
in the proximity to the ostia of the left upper and left lower
pulmonary veins.

Aorta: Normal caliber. No dissection, trivial calcifications and
atherosclerotic plaque.

Aortic Valve:  Trileaflet.  No calcifications.

Coronary Arteries: Normal coronary origin. Right dominance. The
study was performed without use of NTG and insufficient for plaque
evaluation. Calcium score is 548 that represents 96 percentile for
age/sex.
IMPRESSION: 1. There is normal pulmonary vein drainage into the left atrium.

2. The left atrial appendage is large with chicken wing morphology
and no evidence for a thrombus.

3. The esophagus runs to the left from the left atrial midline and
is in the proximity to the ostia of the left upper and left lower
pulmonary veins.

4. Calcium score is 548 that represents 96 percentile for age/sex.

ADDENDUM:
Correction: 64-year-old female (not male) with h/o paroxysmal atrial
fibrillation is being evaluated prior to RF ablation.

*** End of Addendum ***
Addendum:
EXAM:
OVER-READ INTERPRETATION  CT CHEST

The following report is an over-read performed by radiologist Dr.
Akiy Florist [REDACTED] on 06/25/2019. This
over-read does not include interpretation of cardiac or coronary
anatomy or pathology. The coronary calcium score/coronary CTA
interpretation by the cardiologist is attached.
FINDINGS: Aortic atherosclerosis. Within the visualized portions of the thorax
there are no suspicious appearing pulmonary nodules or masses, there
is no acute consolidative airspace disease, no pleural effusions, no
pneumothorax and no lymphadenopathy. Visualized portions of the
upper abdomen are unremarkable. There are no aggressive appearing
lytic or blastic lesions noted in the visualized portions of the
skeleton.
IMPRESSION: 1.  Aortic Atherosclerosis (O6FVK-V73.3).
FINDINGS: A 120 kV prospective scan was triggered in the descending thoracic
aorta at 111 HU's. Gantry rotation speed was 280 msecs and
collimation was .9 mm. No beta blockade and no NTG was given. The 3D
data set was reconstructed in 5% intervals of the 60-80 % of the R-R
cycle. Diastolic phases were analyzed on a dedicated work station
using MPR, MIP and VRT modes. The patient received 80 cc of
contrast.

There is normal pulmonary vein drainage into the left atrium (2 on
the right and 2 on the left) with ostial measurements as follows:

RUPV: 22.6 x 19.1 mm

RLPV: 20.4 x 16.8 mm

LUPV: 18.7 x 9.13 mm

LLPV: 19.8 x 13.4 mm

The left atrial appendage is large with chicken wing morphology and
no evidence for a thrombus.

The esophagus runs to the left from the left atrial midline and is
in the proximity to the ostia of the left upper and left lower
pulmonary veins.

Aorta: Normal caliber. No dissection, trivial calcifications and
atherosclerotic plaque.

Aortic Valve:  Trileaflet.  No calcifications.

Coronary Arteries: Normal coronary origin. Right dominance. The
study was performed without use of NTG and insufficient for plaque
evaluation. Calcium score is 548 that represents 96 percentile for
age/sex.
IMPRESSION: 1. There is normal pulmonary vein drainage into the left atrium.

2. The left atrial appendage is large with chicken wing morphology
and no evidence for a thrombus.

3. The esophagus runs to the left from the left atrial midline and
is in the proximity to the ostia of the left upper and left lower
pulmonary veins.

4. Calcium score is 548 that represents 96 percentile for age/sex.

*** End of Addendum ***
EXAM:
OVER-READ INTERPRETATION  CT CHEST

The following report is an over-read performed by radiologist Dr.
Akiy Florist [REDACTED] on 06/25/2019. This
over-read does not include interpretation of cardiac or coronary
anatomy or pathology. The coronary calcium score/coronary CTA
interpretation by the cardiologist is attached.
FINDINGS: Aortic atherosclerosis. Within the visualized portions of the thorax
there are no suspicious appearing pulmonary nodules or masses, there
is no acute consolidative airspace disease, no pleural effusions, no
pneumothorax and no lymphadenopathy. Visualized portions of the
upper abdomen are unremarkable. There are no aggressive appearing
lytic or blastic lesions noted in the visualized portions of the
skeleton.
IMPRESSION: 1.  Aortic Atherosclerosis (O6FVK-V73.3).

## 2020-05-14 NOTE — Progress Notes (Signed)
Cardiology Office Note:    Date:  05/19/2020   ID:  Catherine Callahan, DOB 1955/04/03, MRN [id number]  PCP:  Carol Ada, MD  Cardiologist:  Sinclair Grooms, MD   Referring MD: Carol Ada, MD   Chief Complaint  Patient presents with  . Coronary Artery Disease  . Atrial Fibrillation    History of Present Illness:    Catherine Callahan is a 65 y.o. female with a hx of  paroxysmal atrial fibrillation, flecainide therapy, obesity, essential hypertension, hyperlipidemia, asymptomatic CAD (CAC 548), and obstructive sleep apnea.  She is doing very well.  Instances of atrial fibrillation are decreasing.  She is 100% compliant with CPAP.  She has lost nearly 20 pounds since the last office visit with [place].  This has been due to change in diet, decreasing carbohydrates, and aerobic exercise.  She has not had syncope.  She denies chest pain.  She did stop taking HCTZ as weight loss has led to decreased blood pressure.  She is limiting salt in her diet.  Past Medical History:  Diagnosis Date  . Arthritis   . Atrial flutter ([place]) 01/05/2013  . CAD (coronary artery disease) 07/01/2019  . Hypertension, essential, benign 01/11/2011  . OSA on CPAP 01/05/2013  . Overweight   . Paroxysmal atrial fibrillation (HCC)   . Ulcerative colitis [facility])     Past Surgical History:  Procedure Laterality Date  . ATRIAL FIBRILLATION ABLATION N/A 06/30/2019   Procedure: ATRIAL FIBRILLATION ABLATION;  Surgeon: Thompson Grayer, MD;  Location: [place] [facility];  Service: Cardiovascular;  Laterality: N/A;  . COLON SURGERY  2004   colon resection    Current Medications: Current Meds  Medication Sig  . apixaban (ELIQUIS) 5 MG TABS tablet Take 1 tablet (5 mg total) by mouth 2 (two) times daily.  Marland Kitchen FIBER PO Take 4 capsules by mouth at bedtime.  Marland Kitchen losartan (COZAAR) 100 MG tablet Take 1 tablet (100 mg total) by mouth daily.  . Mesalamine 800 MG TBEC Take 800 mg by mouth daily.   . metoprolol succinate  (TOPROL-XL) 25 MG 24 hr tablet Take 1 tablet (25 mg total) by mouth daily. Take with or immediately following a meal.  . Multiple Vitamin (MULTIVITAMIN WITH MINERALS) TABS tablet Take 1 tablet by mouth daily. Centrum Silver  . rosuvastatin (CRESTOR) 20 MG tablet TAKE 1 TABLET BY MOUTH EVERY DAY  . [DISCONTINUED] hydrochlorothiazide (MICROZIDE) 12.5 MG capsule Take 1 capsule (12.5 mg total) by mouth daily. IF BP starts to get low again hold as needed.     Allergies:   Penicillin g, Penicillins, Meloxicam, and Latex   Social History   Socioeconomic History  . Marital status: Widowed    Spouse name: Not on file  . Number of children: Not on file  . Years of education: Not on file  . Highest education level: Not on file  Occupational History  . Not on file  Tobacco Use  . Smoking status: Never Smoker  . Smokeless tobacco: Never Used  Vaping Use  . Vaping Use: Never used  Substance and Sexual Activity  . Alcohol use: No  . Drug use: No  . Sexual activity: Not on file  Other Topics Concern  . Not on file  Social History Narrative   Pt lives in [place] alone.  Widowed.  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   Social Determinants of Health  Financial Resource Strain: Not on file  Food Insecurity: Not on file  Transportation Needs: Not on file  Physical Activity: Not on file  Stress: Not on file  Social Connections: Not on file     Family History: The patient's family history includes Alzheimer's disease in her mother; Atrial fibrillation in her father; CAD (age of onset: 42) in her brother; Healthy in her brother and sister; Heart disease in her brother and mother; Other in her brother; Stroke in her father.  ROS:   Please see the history of present illness.    Plans a trip to [place] this summer.  She has intermittent left arm numbness that is positional.  All other systems reviewed and are  negative.  EKGs/Labs/Other Studies Reviewed:    The following studies were reviewed today: No new data  EKG:  EKG not repeated  Recent Labs: 06/22/2019: BUN 15; Creatinine, Ser 1.01; Hemoglobin 15.6; Platelets 248; Potassium 4.0; Sodium 140  Recent Lipid Panel    Component Value Date/Time   CHOL 157 05/11/2019 0845   TRIG 84 05/11/2019 0845   HDL 70 05/11/2019 0845   CHOLHDL 2.2 05/11/2019 0845   CHOLHDL 3.3 04/10/2010 0340   VLDL 77 (H) 04/10/2010 0340   LDLCALC 71 05/11/2019 0845    Physical Exam:    VS:  BP 112/68   Pulse 69   Ht 5\' 6"  (1.676 m)   Wt 238 lb 9.6 oz (108.2 kg)   SpO2 97%   BMI 38.51 kg/m     Wt Readings from Last 3 Encounters:  05/19/20 238 lb 9.6 oz (108.2 kg)  01/11/20 253 lb (114.8 kg)  11/23/19 245 lb 3.2 oz (111.2 kg)     GEN: Overweight. No acute distress HEENT: Normal NECK: No JVD. LYMPHATICS: No lymphadenopathy CARDIAC: No murmur. RRR no gallop, or edema. VASCULAR:  Normal Pulses. No bruits. RESPIRATORY:  Clear to auscultation without rales, wheezing or rhonchi  ABDOMEN: Soft, non-tender, non-distended, No pulsatile mass, MUSCULOSKELETAL: No deformity  SKIN: Warm and dry NEUROLOGIC:  Alert and oriented x 3 PSYCHIATRIC:  Normal affect   ASSESSMENT:    1. Paroxysmal atrial fibrillation (HCC)   2. Hyperlipidemia LDL goal <70   3. CAD in native artery   4. Benign essential HTN   5. OSA on CPAP   6. Secondary hypercoagulable state ([place])   7. Acquired thrombophilia ([place])   8. Hyperglycemia    PLAN:    In order of problems listed above:  1. Instances of self identified atrial fibrillation have significantly decreased. 2. A lipid panel will be obtained today.  Calcium score is greater than 500.  Target LDL should be less than 70. 3. Primary prevention reviewed.  The most important of which is aerobic activity. 4. Blood pressure control is excellent off hydrochlorothiazide.  Leave HCTZ off and continue medications  otherwise. 5. Compliant with CPAP. 6. No bleeding on Eliquis. 7. Hemoglobin A1c  Overall education and awareness concerning secondary risk prevention was discussed in detail: LDL less than 70, hemoglobin A1c less than 7, blood pressure target less than 130/80 mmHg, >150 minutes of moderate aerobic activity per week, avoidance of smoking, weight control (via diet and exercise), and continued surveillance/management of/for obstructive sleep apnea.    Medication Adjustments/Labs and Tests Ordered: Current medicines are reviewed at length with the patient today.  Concerns regarding medicines are outlined above.  Orders Placed This Encounter  Procedures  . Lipid panel  . Hepatic function panel  . Basic metabolic panel  .  HgB A1c   No orders of the defined types were placed in this encounter.   Patient Instructions  Medication Instructions:  Your physician recommends that you continue on your current medications as directed. Please refer to the Current Medication list given to you today.  *If you need a refill on your cardiac medications before your next appointment, please call your pharmacy*   Lab Work: Lipid, Liver, BMET and A1C today  If you have labs (blood work) drawn today and your tests are completely normal, you will receive your results only by: Marland Kitchen MyChart Message (if you have MyChart) OR . A paper copy in the mail If you have any lab test that is abnormal or we need to change your treatment, we will call you to review the results.   Testing/Procedures: None   Follow-Up: At [facility], you and your health needs are our priority.  As part of our continuing mission to provide you with exceptional heart care, we have created designated Provider Care Teams.  These Care Teams include your primary Cardiologist (physician) and Advanced Practice Providers (APPs -  Physician Assistants and Nurse Practitioners) who all work together to provide you with the care you need, when  you need it.  We recommend signing up for the patient portal called "MyChart".  Sign up information is provided on this After Visit Summary.  MyChart is used to connect with patients for Virtual Visits (Telemedicine).  Patients are able to view lab/test results, encounter notes, upcoming appointments, etc.  Non-urgent messages can be sent to your provider as well.   To learn more about what you can do with MyChart, go to [email or web address].    Your next appointment:   1 year(s)  The format for your next appointment:   In Person  Provider:   You may see Sinclair Grooms, MD or one of the following Advanced Practice Providers on your designated Care Team:    Kathyrn Drown, NP    Other Instructions      Signed, Sinclair Grooms, MD  05/19/2020 8:55 AM    [place]

## 2020-05-19 ENCOUNTER — Encounter: Payer: Self-pay | Admitting: Interventional Cardiology

## 2020-05-19 ENCOUNTER — Other Ambulatory Visit: Payer: Self-pay

## 2020-05-19 ENCOUNTER — Ambulatory Visit (INDEPENDENT_AMBULATORY_CARE_PROVIDER_SITE_OTHER): Payer: Medicare Other | Admitting: Interventional Cardiology

## 2020-05-19 VITALS — BP 112/68 | HR 69 | Ht 66.0 in | Wt 238.6 lb

## 2020-05-19 DIAGNOSIS — I2510 Atherosclerotic heart disease of native coronary artery without angina pectoris: Secondary | ICD-10-CM

## 2020-05-19 DIAGNOSIS — I480 Paroxysmal atrial fibrillation: Secondary | ICD-10-CM

## 2020-05-19 DIAGNOSIS — R739 Hyperglycemia, unspecified: Secondary | ICD-10-CM | POA: Diagnosis not present

## 2020-05-19 DIAGNOSIS — E785 Hyperlipidemia, unspecified: Secondary | ICD-10-CM

## 2020-05-19 DIAGNOSIS — I10 Essential (primary) hypertension: Secondary | ICD-10-CM

## 2020-05-19 DIAGNOSIS — Z79899 Other long term (current) drug therapy: Secondary | ICD-10-CM

## 2020-05-19 DIAGNOSIS — Z9989 Dependence on other enabling machines and devices: Secondary | ICD-10-CM

## 2020-05-19 DIAGNOSIS — G4733 Obstructive sleep apnea (adult) (pediatric): Secondary | ICD-10-CM

## 2020-05-19 DIAGNOSIS — D6869 Other thrombophilia: Secondary | ICD-10-CM

## 2020-05-19 NOTE — Patient Instructions (Signed)
Medication Instructions:  Your physician recommends that you continue on your current medications as directed. Please refer to the Current Medication list given to you today.  *If you need a refill on your cardiac medications before your next appointment, please call your pharmacy*   Lab Work: Lipid, Liver, BMET and A1C today  If you have labs (blood work) drawn today and your tests are completely normal, you will receive your results only by: Marland Kitchen MyChart Message (if you have MyChart) OR . A paper copy in the mail If you have any lab test that is abnormal or we need to change your treatment, we will call you to review the results.   Testing/Procedures: None   Follow-Up: At [facility], you and your health needs are our priority.  As part of our continuing mission to provide you with exceptional heart care, we have created designated Provider Care Teams.  These Care Teams include your primary Cardiologist (physician) and Advanced Practice Providers (APPs -  Physician Assistants and Nurse Practitioners) who all work together to provide you with the care you need, when you need it.  We recommend signing up for the patient portal called "MyChart".  Sign up information is provided on this After Visit Summary.  MyChart is used to connect with patients for Virtual Visits (Telemedicine).  Patients are able to view lab/test results, encounter notes, upcoming appointments, etc.  Non-urgent messages can be sent to your provider as well.   To learn more about what you can do with MyChart, go to [email or web address].    Your next appointment:   1 year(s)  The format for your next appointment:   In Person  Provider:   You may see Lesleigh Noe, MD or one of the following Advanced Practice Providers on your designated Care Team:    Georgie Chard, NP    Other Instructions

## 2020-05-20 LAB — LIPID PANEL
Chol/HDL Ratio: 2.8 ratio (ref 0.0–4.4)
Cholesterol, Total: 192 mg/dL (ref 100–199)
HDL: 68 mg/dL (ref >39)
LDL Chol Calc (NIH): 109 mg/dL — ABNORMAL HIGH (ref 0–99)
Triglycerides: 85 mg/dL (ref 0–149)
VLDL Cholesterol Cal: 15 mg/dL (ref 5–40)

## 2020-05-20 LAB — BASIC METABOLIC PANEL
BUN/Creatinine Ratio: 24 (ref 12–28)
BUN: 19 mg/dL (ref 8–27)
CO2: 22 mmol/L (ref 20–29)
Calcium: 9.9 mg/dL (ref 8.7–10.3)
Chloride: 100 mmol/L (ref 96–106)
Creatinine, Ser: 0.79 mg/dL (ref 0.57–1.00)
Glucose: 92 mg/dL (ref 65–99)
Potassium: 4.1 mmol/L (ref 3.5–5.2)
Sodium: 141 mmol/L (ref 134–144)
eGFR: 83 mL/min/{1.73_m2} (ref >59)

## 2020-05-20 LAB — HEPATIC FUNCTION PANEL
ALT: 17 IU/L (ref 0–32)
AST: 20 IU/L (ref 0–40)
Albumin: 4.5 g/dL (ref 3.8–4.8)
Alkaline Phosphatase: 85 IU/L (ref 44–121)
Bilirubin Total: 0.5 mg/dL (ref 0.0–1.2)
Bilirubin, Direct: 0.14 mg/dL (ref 0.00–0.40)
Total Protein: 6.8 g/dL (ref 6.0–8.5)

## 2020-05-20 LAB — HEMOGLOBIN A1C
Est. average glucose Bld gHb Est-mCnc: 103 mg/dL
Hgb A1c MFr Bld: 5.2 % (ref 4.8–5.6)

## 2020-05-25 ENCOUNTER — Telehealth: Payer: Self-pay | Admitting: *Deleted

## 2020-05-25 DIAGNOSIS — E785 Hyperlipidemia, unspecified: Secondary | ICD-10-CM

## 2020-05-25 MED ORDER — ROSUVASTATIN CALCIUM 40 MG PO TABS
40.0000 mg | ORAL_TABLET | Freq: Every day | ORAL | 3 refills | Status: DC
Start: 2020-05-25 — End: 2021-05-22

## 2020-05-25 NOTE — Telephone Encounter (Signed)
Spoke with pt and reviewed results and recommendations per Dr. Katrinka Blazing.  Pt agreeable to plan.  She will come on 5/25 for labs.  She asked that I send prescription with note to place on hold because she has plenty of 20mg  tablets to double up on for now.

## 2020-05-25 NOTE — Telephone Encounter (Signed)
-----   Message from Lyn Records, MD sent at 05/20/2020 12:36 PM EDT ----- Let the patient know the labs are generally good. LDL is 100. Too high. May be diet related. Increase Rosuvastatin to 40 mg daily. Liver and lipid 2 months A copy will be sent to Merri Brunette, MD

## 2020-06-01 ENCOUNTER — Other Ambulatory Visit: Payer: Self-pay | Admitting: Interventional Cardiology

## 2020-07-13 ENCOUNTER — Other Ambulatory Visit: Payer: Self-pay | Admitting: Interventional Cardiology

## 2020-07-13 NOTE — Telephone Encounter (Signed)
Eliquis 5mg  refill request received. Patient is 65 years old, weight-108.2kg, Crea-0.79 on 05/19/2020, Diagnosis-Afib, and last seen by Dr. 05/21/2020 on 05/19/2020. Dose is appropriate based on dosing criteria. Will send in refill to requested pharmacy.

## 2020-07-14 ENCOUNTER — Other Ambulatory Visit: Payer: Self-pay | Admitting: Internal Medicine

## 2020-07-27 ENCOUNTER — Other Ambulatory Visit: Payer: Medicare Other | Admitting: *Deleted

## 2020-07-27 ENCOUNTER — Other Ambulatory Visit: Payer: Self-pay

## 2020-07-27 DIAGNOSIS — E785 Hyperlipidemia, unspecified: Secondary | ICD-10-CM

## 2020-07-27 LAB — LIPID PANEL
Chol/HDL Ratio: 2.6 ratio (ref 0.0–4.4)
Cholesterol, Total: 176 mg/dL (ref 100–199)
HDL: 68 mg/dL (ref >39)
LDL Chol Calc (NIH): 93 mg/dL (ref 0–99)
Triglycerides: 80 mg/dL (ref 0–149)
VLDL Cholesterol Cal: 15 mg/dL (ref 5–40)

## 2020-07-27 LAB — HEPATIC FUNCTION PANEL
ALT: 17 IU/L (ref 0–32)
AST: 18 IU/L (ref 0–40)
Albumin: 4.3 g/dL (ref 3.8–4.8)
Alkaline Phosphatase: 88 IU/L (ref 44–121)
Bilirubin Total: 0.5 mg/dL (ref 0.0–1.2)
Bilirubin, Direct: 0.18 mg/dL (ref 0.00–0.40)
Total Protein: 6.6 g/dL (ref 6.0–8.5)

## 2020-07-29 ENCOUNTER — Telehealth: Payer: Self-pay | Admitting: *Deleted

## 2020-07-29 DIAGNOSIS — E785 Hyperlipidemia, unspecified: Secondary | ICD-10-CM

## 2020-07-29 MED ORDER — EZETIMIBE 10 MG PO TABS
10.0000 mg | ORAL_TABLET | Freq: Every day | ORAL | 3 refills | Status: DC
Start: 2020-07-29 — End: 2021-07-06

## 2020-07-29 NOTE — Telephone Encounter (Signed)
-----   Message from Lyn Records, MD sent at 07/28/2020  9:15 PM EDT ----- Let the patient know the LDL still > 70, Add Zetia 10 mg daily. Repeat the lipid panel in 6-8 weeks A copy will be sent to Merri Brunette, MD

## 2020-07-29 NOTE — Telephone Encounter (Signed)
Spoke with pt and reviewed results and recommendations.  Pt agreeable to plan.  She will come for labs on 7/25.

## 2020-08-05 ENCOUNTER — Other Ambulatory Visit: Payer: Medicare Other

## 2020-09-26 ENCOUNTER — Other Ambulatory Visit: Payer: Medicare Other

## 2020-09-27 NOTE — Progress Notes (Signed)
PCP:  Merri Brunette, MD Primary Cardiologist: Lesleigh Noe, MD Electrophysiologist: [name], MD   Catherine Callahan is a 65 y.o. female seen today for [name], MD for routine electrophysiology followup.  Since last being seen in our clinic the patient reports doing OK. She had a recent episode of diverticulitis and she feels like since then she had been going in and out of AF on almost a daily basis.  she denies chest pain, palpitations, dyspnea, PND, orthopnea, nausea, vomiting, dizziness, syncope, edema, weight gain, or early satiety.  Past Medical History:  Diagnosis Date   Arthritis    Atrial flutter (HCC) 01/05/2013   CAD (coronary artery disease) 07/01/2019   Hypertension, essential, benign 01/11/2011   OSA on CPAP 01/05/2013   Overweight    Paroxysmal atrial fibrillation (HCC)    Ulcerative colitis (HCC)    Past Surgical History:  Procedure Laterality Date   ATRIAL FIBRILLATION ABLATION N/A 06/30/2019   Procedure: ATRIAL FIBRILLATION ABLATION;  Surgeon: [name], MD;  Location: [facility];  Service: Cardiovascular;  Laterality: N/A;   COLON SURGERY  2004   colon resection    Current Outpatient Medications  Medication Sig Dispense Refill   ELIQUIS 5 MG TABS tablet TAKE 1 TABLET BY MOUTH TWICE A DAY 180 tablet 2   ezetimibe (ZETIA) 10 MG tablet Take 1 tablet (10 mg total) by mouth daily. 90 tablet 3   FIBER PO Take 4 capsules by mouth at bedtime.     losartan (COZAAR) 100 MG tablet Take 1 tablet (100 mg total) by mouth daily. 90 tablet 3   Mesalamine 800 MG TBEC Take 800 mg by mouth daily.      metoprolol succinate (TOPROL-XL) 25 MG 24 hr tablet TAKE 1 TABLET (25 MG TOTAL) BY MOUTH DAILY. TAKE WITH OR IMMEDIATELY FOLLOWING A MEAL. 90 tablet 1   Multiple Vitamin (MULTIVITAMIN WITH MINERALS) TABS tablet Take 1 tablet by mouth daily. Centrum Silver     rosuvastatin (CRESTOR) 40 MG tablet Take 1 tablet (40 mg total) by mouth daily. 90 tablet 3   No  current facility-administered medications for this visit.    Allergies  Allergen Reactions   Penicillin G Anaphylaxis   Penicillins Anaphylaxis   Meloxicam Hives   Latex Rash and Hives    Social History   Socioeconomic History   Marital status: Widowed    Spouse name: Not on file   Number of children: Not on file   Years of education: Not on file   Highest education level: Not on file  Occupational History   Not on file  Tobacco Use   Smoking status: Never   Smokeless tobacco: Never  Vaping Use   Vaping Use: Never used  Substance and Sexual Activity   Alcohol use: No   Drug use: No   Sexual activity: Not on file  Other Topics Concern   Not on file  Social History Narrative   Pt lives in [place] alone.  Widowed.  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   Social Determinants of Health   Financial Resource Strain: Not on file  Food Insecurity: Not on file  Transportation Needs: Not on file  Physical Activity: Not on file  Stress: Not on file  Social Connections: Not on file  Intimate Partner Violence: Not on file     Review of Systems: General: No chills, fever, night  sweats or weight changes  Cardiovascular:  No chest pain, dyspnea on exertion, edema, orthopnea, palpitations, paroxysmal nocturnal dyspnea Dermatological: No rash, lesions or masses Respiratory: No cough, dyspnea Urologic: No hematuria, dysuria Abdominal: No nausea, vomiting, diarrhea, bright red blood per rectum, melena, or hematemesis Neurologic: No visual changes, weakness, changes in mental status All other systems reviewed and are otherwise negative except as noted above.  Physical Exam: Vitals:   09/28/20 0825  BP: 118/70  Pulse: 83  SpO2: 98%  Weight: 240 lb (108.9 kg)  Height: 5\' 6"  (1.676 m)    GEN- The patient is well appearing, alert and oriented x 3 today.   HEENT: normocephalic, atraumatic; sclera  clear, conjunctiva pink; hearing intact; oropharynx clear; neck supple, no JVP Lymph- no cervical lymphadenopathy Lungs- Clear to ausculation bilaterally, normal work of breathing.  No wheezes, rales, rhonchi Heart- Irregularly irregular rate and rhythm, no murmurs, rubs or gallops, PMI not laterally displaced GI- soft, non-tender, non-distended, bowel sounds present, no hepatosplenomegaly Extremities- no clubbing, cyanosis, or edema; DP/PT/radial pulses 2+ bilaterally MS- no significant deformity or atrophy Skin- warm and dry, no rash or lesion Psych- euthymic mood, full affect Neuro- strength and sensation are intact  EKG is not ordered. Personal review of EKG from today shows Atrial fibrillation at 77 bpm  Additional studies reviewed include: Previous office notes and EP visit notes  Assessment and Plan:  1. Persistent afib/ atrial flutter With increasing breakthroughs of palpitation over the past couple of months vs persistence since her episode of diverticulitis. Continue eliquis for CHA2DS2-VASc of at least 3. She takes metoprolol daily.  Reviewed with Dr. . More likely she has been persistent since her acute illness and would plan [facility] first.  Will plan EKG day before [facility] to make sure she isn't truly going in and out.  Update Echo.  If able to [facility] may be able to continue current treatments.  Previously failed flecainide. Refuses amiodarone.  Would potentially consider Tikosyn but wants to discuss possibility of re-do ablation first.    2. OSA Encouraged nightly CPAP  3. Obesity Body mass index is 38.74 kg/m.  Encouraged lifestyle modification   4. HTN Well controlled on current regimen.   5. HL Continue crestor and zetia per primary  [facility], PA-C  09/28/20 8:42 AM

## 2020-09-28 ENCOUNTER — Encounter: Payer: Self-pay | Admitting: Student

## 2020-09-28 ENCOUNTER — Other Ambulatory Visit: Payer: Self-pay

## 2020-09-28 ENCOUNTER — Other Ambulatory Visit: Payer: Medicare Other

## 2020-09-28 ENCOUNTER — Ambulatory Visit (INDEPENDENT_AMBULATORY_CARE_PROVIDER_SITE_OTHER): Payer: Medicare Other | Admitting: Student

## 2020-09-28 VITALS — BP 118/70 | HR 83 | Ht 66.0 in | Wt 240.0 lb

## 2020-09-28 DIAGNOSIS — G4733 Obstructive sleep apnea (adult) (pediatric): Secondary | ICD-10-CM

## 2020-09-28 DIAGNOSIS — E785 Hyperlipidemia, unspecified: Secondary | ICD-10-CM

## 2020-09-28 DIAGNOSIS — I480 Paroxysmal atrial fibrillation: Secondary | ICD-10-CM | POA: Diagnosis not present

## 2020-09-28 DIAGNOSIS — I2510 Atherosclerotic heart disease of native coronary artery without angina pectoris: Secondary | ICD-10-CM

## 2020-09-28 DIAGNOSIS — I10 Essential (primary) hypertension: Secondary | ICD-10-CM

## 2020-09-28 DIAGNOSIS — Z9989 Dependence on other enabling machines and devices: Secondary | ICD-10-CM

## 2020-09-28 NOTE — Patient Instructions (Addendum)
Medication Instructions:  Your physician recommends that you continue on your current medications as directed. Please refer to the Current Medication list given to you today.  *If you need a refill on your cardiac medications before your next appointment, please call your pharmacy*   Lab Work: Your physician recommends that you return for lab work tomorrow 09/29/20. You can arrive anytime between 7:30 AM - 4:45 PM, Bmp, Cbc, Lipids, LFt's  If you have labs (blood work) drawn today and your tests are completely normal, you will receive your results only by: MyChart Message (if you have MyChart) OR A paper copy in the mail If you have any lab test that is abnormal or we need to change your treatment, we will call you to review the results.   Testing/Procedures: Your physician has requested that you have an echocardiogram. Echocardiography is a painless test that uses sound waves to create images of your heart. It provides your doctor with information about the size and shape of your heart and how well your heart's chambers and valves are working. This procedure takes approximately one hour. There are no restrictions for this procedure.  Your physician has request that you have a cardioversion *Instructions Below*   Follow-Up: At [facility], you and your health needs are our priority.  As part of our continuing mission to provide you with exceptional heart care, we have created designated Provider Care Teams.  These Care Teams include your primary Cardiologist (physician) and Advanced Practice Providers (APPs -  Physician Assistants and Nurse Practitioners) who all work together to provide you with the care you need, when you need it.  We recommend signing up for the patient portal called "MyChart".  Sign up information is provided on this After Visit Summary.  MyChart is used to connect with patients for Virtual Visits (Telemedicine).  Patients are able to view lab/test results, encounter  notes, upcoming appointments, etc.  Non-urgent messages can be sent to your provider as well.   To learn more about what you can do with MyChart, go to [email or web address].    Your next appointment:   Follow up as scheduled   Other Instructions You are scheduled for a Cardioversion on 10/06/2020 with Dr.Bridgette Cristal Deer   Please arrive at the [facility] (Main Entrance A) at [facility]: [address] [place], [place] [address] at 10:30 am. (1 hour prior to procedure unless lab work is needed; if lab work is needed arrive 1.5 hours ahead)  DIET: Nothing to eat or drink after midnight except a sip of water with medications (see medication instructions below)  FYI: For your safety, and to allow [place] to monitor your vital signs accurately during the surgery/procedure we request that   if you have artificial nails, gel coating, SNS etc. Please have those removed prior to your surgery/procedure. Not having the nail coverings /polish removed may result in cancellation or delay of your surgery/procedure.   Medication Instructions:  Continue your anticoagulant: Eliquis  You will need to continue your anticoagulant after your procedure until you are told by your provider that it is safe to stop   You must have a responsible person to drive you home and stay in the waiting area during your procedure. Failure to do so could result in cancellation.  Bring your insurance cards.  *Special Note: Every effort is made to have your procedure done on time. Occasionally there are emergencies that occur at the hospital that may cause delays. Please be patient if a  delay does occur.

## 2020-09-29 ENCOUNTER — Other Ambulatory Visit: Payer: Medicare Other | Admitting: *Deleted

## 2020-09-29 DIAGNOSIS — Z9989 Dependence on other enabling machines and devices: Secondary | ICD-10-CM

## 2020-09-29 DIAGNOSIS — E785 Hyperlipidemia, unspecified: Secondary | ICD-10-CM

## 2020-09-29 DIAGNOSIS — I480 Paroxysmal atrial fibrillation: Secondary | ICD-10-CM

## 2020-09-29 DIAGNOSIS — I10 Essential (primary) hypertension: Secondary | ICD-10-CM

## 2020-09-29 DIAGNOSIS — G4733 Obstructive sleep apnea (adult) (pediatric): Secondary | ICD-10-CM

## 2020-09-29 LAB — LIPID PANEL
Chol/HDL Ratio: 2.3 ratio (ref 0.0–4.4)
Cholesterol, Total: 136 mg/dL (ref 100–199)
HDL: 59 mg/dL (ref >39)
LDL Chol Calc (NIH): 62 mg/dL (ref 0–99)
Triglycerides: 77 mg/dL (ref 0–149)
VLDL Cholesterol Cal: 15 mg/dL (ref 5–40)

## 2020-09-29 LAB — HEPATIC FUNCTION PANEL
ALT: 25 IU/L (ref 0–32)
AST: 20 IU/L (ref 0–40)
Albumin: 4.2 g/dL (ref 3.8–4.8)
Alkaline Phosphatase: 72 IU/L (ref 44–121)
Bilirubin Total: 0.6 mg/dL (ref 0.0–1.2)
Bilirubin, Direct: 0.21 mg/dL (ref 0.00–0.40)
Total Protein: 6.2 g/dL (ref 6.0–8.5)

## 2020-09-29 LAB — BASIC METABOLIC PANEL
BUN/Creatinine Ratio: 16 (ref 12–28)
BUN: 11 mg/dL (ref 8–27)
CO2: 24 mmol/L (ref 20–29)
Calcium: 9.4 mg/dL (ref 8.7–10.3)
Chloride: 101 mmol/L (ref 96–106)
Creatinine, Ser: 0.67 mg/dL (ref 0.57–1.00)
Glucose: 91 mg/dL (ref 65–99)
Potassium: 4.2 mmol/L (ref 3.5–5.2)
Sodium: 142 mmol/L (ref 134–144)
eGFR: 96 mL/min/{1.73_m2} (ref >59)

## 2020-09-29 LAB — CBC
Hematocrit: 41.3 % (ref 34.0–46.6)
Hemoglobin: 13.8 g/dL (ref 11.1–15.9)
MCH: 33.5 pg — ABNORMAL HIGH (ref 26.6–33.0)
MCHC: 33.4 g/dL (ref 31.5–35.7)
MCV: 100 fL — ABNORMAL HIGH (ref 79–97)
Platelets: 203 10*3/uL (ref 150–450)
RBC: 4.12 x10E6/uL (ref 3.77–5.28)
RDW: 12.5 % (ref 11.7–15.4)
WBC: 5.9 10*3/uL (ref 3.4–10.8)

## 2020-10-04 NOTE — Telephone Encounter (Signed)
Pt missed her dose of Eliquis on Sunday night so we have switched her to a TEE/Cardioversion. This will be done on 8/4 @ 12:00. Pt will come in the office tomorrow morning for an EKG to determine if she is still in A-Fib.

## 2020-10-05 ENCOUNTER — Other Ambulatory Visit: Payer: Self-pay

## 2020-10-05 ENCOUNTER — Ambulatory Visit (INDEPENDENT_AMBULATORY_CARE_PROVIDER_SITE_OTHER): Payer: Medicare Other | Admitting: Student

## 2020-10-05 DIAGNOSIS — I480 Paroxysmal atrial fibrillation: Secondary | ICD-10-CM

## 2020-10-05 MED ORDER — METOPROLOL SUCCINATE ER 50 MG PO TB24: 50.0000 mg | ORAL_TABLET | Freq: Every day | ORAL | 3 refills | Status: DC

## 2020-10-05 NOTE — Progress Notes (Signed)
Patient in AF with controlled rate today.   Kardia strips reviewed with multiple days in a row of maintaining NSR.   With this, and having missed a dose of Eliquis, we will defer cardioversion as would now require TEE.  Benefit is likely minimal as she continues to go in and out of rhythm by [place] mobile.   Keep Echo for later this month and f/u with Dr. Johney Frame as scheduled. She will continue to follow Lourena Simmonds and if remains out of rhythm for an extended period of time (likely a week or more) can revisit [facility] alone after she has been back on Eliquis x 3 weeks un-interrupted.    Increase toprol to 50 mg qhs in the mean time.   Casimiro Needle [address] [place], PA-C  10/05/2020 8:39 AM

## 2020-10-06 ENCOUNTER — Ambulatory Visit (HOSPITAL_COMMUNITY): Admission: RE | Admit: 2020-10-06 | Payer: Medicare Other | Source: Home / Self Care | Admitting: Cardiology

## 2020-10-06 ENCOUNTER — Encounter (HOSPITAL_COMMUNITY): Admission: RE | Payer: Self-pay | Source: Home / Self Care

## 2020-10-06 ENCOUNTER — Other Ambulatory Visit: Payer: Self-pay | Admitting: Family Medicine

## 2020-10-06 DIAGNOSIS — E2839 Other primary ovarian failure: Secondary | ICD-10-CM

## 2020-10-06 SURGERY — CARDIOVERSION
Anesthesia: General

## 2020-10-12 ENCOUNTER — Ambulatory Visit: Payer: Medicare Other | Admitting: Student

## 2020-10-12 ENCOUNTER — Other Ambulatory Visit: Payer: Self-pay

## 2020-10-12 ENCOUNTER — Ambulatory Visit (HOSPITAL_COMMUNITY): Payer: Medicare Other | Attending: Internal Medicine

## 2020-10-12 DIAGNOSIS — I480 Paroxysmal atrial fibrillation: Secondary | ICD-10-CM | POA: Diagnosis not present

## 2020-10-12 LAB — ECHOCARDIOGRAM COMPLETE
Area-P 1/2: 3.7 cm2
MV M vel: 4.21 m/s
MV Peak grad: 70.9 mmHg
Radius: 0.7 cm
S' Lateral: 3.5 cm

## 2020-12-05 ENCOUNTER — Encounter: Payer: Self-pay | Admitting: Internal Medicine

## 2020-12-05 ENCOUNTER — Encounter: Payer: Self-pay | Admitting: *Deleted

## 2020-12-05 ENCOUNTER — Other Ambulatory Visit: Payer: Self-pay

## 2020-12-05 ENCOUNTER — Ambulatory Visit (INDEPENDENT_AMBULATORY_CARE_PROVIDER_SITE_OTHER): Payer: Medicare Other | Admitting: Internal Medicine

## 2020-12-05 VITALS — BP 118/82 | HR 70 | Ht 66.0 in | Wt 239.8 lb

## 2020-12-05 DIAGNOSIS — G4733 Obstructive sleep apnea (adult) (pediatric): Secondary | ICD-10-CM

## 2020-12-05 DIAGNOSIS — I483 Typical atrial flutter: Secondary | ICD-10-CM

## 2020-12-05 DIAGNOSIS — Z01818 Encounter for other preprocedural examination: Secondary | ICD-10-CM | POA: Diagnosis not present

## 2020-12-05 DIAGNOSIS — E785 Hyperlipidemia, unspecified: Secondary | ICD-10-CM

## 2020-12-05 DIAGNOSIS — I10 Essential (primary) hypertension: Secondary | ICD-10-CM

## 2020-12-05 DIAGNOSIS — I2510 Atherosclerotic heart disease of native coronary artery without angina pectoris: Secondary | ICD-10-CM

## 2020-12-05 DIAGNOSIS — I4819 Other persistent atrial fibrillation: Secondary | ICD-10-CM

## 2020-12-05 NOTE — Progress Notes (Signed)
PCP: Merri Brunette, MD Primary Cardiologist: Dr Katrinka Blazing Primary EP: Dr Lavon Paganini is a 65 y.o. female who presents today for routine electrophysiology followup.  Since last being seen in our clinic, the patient reports doing reasonably well.  Her afib has increased.  She has been documented to have afib.  Episodes occur every few days, lasting several hours.  + palpitations and fatigue. Today, she denies symptoms of chest pain, shortness of breath,  lower extremity edema, dizziness, presyncope, or syncope.  The patient is otherwise without complaint today.   Past Medical History:  Diagnosis Date   Arthritis    Atrial flutter (HCC) 01/05/2013   CAD (coronary artery disease) 07/01/2019   Hypertension, essential, benign 01/11/2011   OSA on CPAP 01/05/2013   Overweight    Paroxysmal atrial fibrillation (HCC)    Ulcerative colitis (HCC)    Past Surgical History:  Procedure Laterality Date   ATRIAL FIBRILLATION ABLATION N/A 06/30/2019   Procedure: ATRIAL FIBRILLATION ABLATION;  Surgeon: [name], MD;  Location: [facility];  Service: Cardiovascular;  Laterality: N/A;   COLON SURGERY  2004   colon resection    ROS- all systems are reviewed and negatives except as per HPI above  Current Outpatient Medications  Medication Sig Dispense Refill   ELIQUIS 5 MG TABS tablet TAKE 1 TABLET BY MOUTH TWICE A DAY (Patient taking differently: Take 5 mg by mouth 2 (two) times daily.) 180 tablet 2   ezetimibe (ZETIA) 10 MG tablet Take 1 tablet (10 mg total) by mouth daily. 90 tablet 3   FIBER PO Take 4 capsules by mouth at bedtime.     losartan (COZAAR) 100 MG tablet Take 1 tablet (100 mg total) by mouth daily. 90 tablet 3   Mesalamine 800 MG TBEC Take 800 mg by mouth daily.      metoprolol succinate (TOPROL-XL) 50 MG 24 hr tablet Take 1 tablet (50 mg total) by mouth at bedtime. 90 tablet 3   Multiple Vitamin (MULTIVITAMIN WITH MINERALS) TABS tablet Take 1 tablet by mouth  daily. Centrum Silver     rosuvastatin (CRESTOR) 40 MG tablet Take 1 tablet (40 mg total) by mouth daily. 90 tablet 3   No current facility-administered medications for this visit.    Physical Exam: Vitals:   12/05/20 1126  BP: 118/82  Pulse: 70  SpO2: 95%  Weight: 239 lb 12.8 oz (108.8 kg)  Height: 5\' 6"  (1.676 m)    GEN- The patient is well appearing, alert and oriented x 3 today.   Head- normocephalic, atraumatic Eyes-  Sclera clear, conjunctiva pink Ears- hearing intact Oropharynx- clear Lungs- Clear to ausculation bilaterally, normal work of breathing Heart- Regular rate and rhythm, no murmurs, rubs or gallops, PMI not laterally displaced GI- soft, NT, ND, + BS Extremities- no clubbing, cyanosis, or edema  Wt Readings from Last 3 Encounters:  12/05/20 239 lb 12.8 oz (108.8 kg)  09/28/20 240 lb (108.9 kg)  05/19/20 238 lb 9.6 oz (108.2 kg)    EKG tracing ordered today is personally reviewed and shows sinus rhythm 70 bpm, PR 216 msec  Ecg 10/05/20- coarse afib  Echo 10/12/20- EF 60%, LA size mildly enlarged, mild to moderate MR  Assessment and Plan:  Persistent atrial fibrillation/ atrial flutter Afib has increased since I saw her last Chads2vasc score is 3.  She is on eliquis The patient has symptomatic, recurrent  atrial fibrillation. She is s/p ablation 06/30/19  Therapeutic strategies for afib  including medicine (tikosyn, amiodarone) and repeat ablation were discussed in detail with the patient today. Risk, benefits, and alternatives to EP study and radiofrequency ablation for afib were also discussed in detail today. These risks include but are not limited to stroke, bleeding, vascular damage, tamponade, perforation, damage to the esophagus, lungs, and other structures, pulmonary vein stenosis, worsening renal function, and death. The patient understands these risk and wishes to proceed.  We will therefore proceed with catheter ablation at the next available time.   Carto, ICE, anesthesia are requested for the procedure.  Will also obtain cardiac CT prior to the procedure to exclude LAA thrombus and further evaluate atrial anatomy.   2 obesity Body mass index is 38.7 kg/m. Lifestyle modification advised  3. OSA Uses CPAP  4. HTN Stable No change required today   Risks, benefits and potential toxicities for medications prescribed and/or refilled reviewed with patient today.   [name] MD, [facility] 12/05/2020 11:34 AM

## 2020-12-05 NOTE — Patient Instructions (Addendum)
Medication Instructions:  Your physician recommends that you continue on your current medications as directed. Please refer to the Current Medication list given to you today. *If you need a refill on your cardiac medications before your next appointment, please call your pharmacy*  Lab Work: None. If you have labs (blood work) drawn today and your tests are completely normal, you will receive your results only by: MyChart Message (if you have MyChart) OR A paper copy in the mail If you have any lab test that is abnormal or we need to change your treatment, we will call you to review the results.  Testing/Procedures: Your physician has requested that you have cardiac CT. Cardiac computed tomography (CT) is a painless test that uses an x-ray machine to take clear, detailed pictures of your heart. For further information please visit www.cardiosmart.org. Please follow instruction sheet as given.   Your physician has recommended that you have an ablation. Catheter ablation is a medical procedure used to treat some cardiac arrhythmias (irregular heartbeats). During catheter ablation, a long, thin, flexible tube is put into a blood vessel in your groin (upper thigh), or neck. This tube is called an ablation catheter. It is then guided to your heart through the blood vessel. Radio frequency waves destroy small areas of heart tissue where abnormal heartbeats may cause an arrhythmia to start. Please see the instruction sheet given to you today.   Follow-Up: At [facility], you and your health needs are our priority.  As part of our continuing mission to provide you with exceptional heart care, we have created designated Provider Care Teams.  These Care Teams include your primary Cardiologist (physician) and Advanced Practice Providers (APPs -  Physician Assistants and Nurse Practitioners) who all work together to provide you with the care you need, when you need it.  We recommend signing up for the  patient portal called "MyChart".  Sign up information is provided on this After Visit Summary.  MyChart is used to connect with patients for Virtual Visits (Telemedicine).  Patients are able to view lab/test results, encounter notes, upcoming appointments, etc.  Non-urgent messages can be sent to your provider as well.   To learn more about what you can do with MyChart, go to [email or web address].    Any Other Special Instructions Will Be Listed Below (If Applicable).  Cardiac Ablation Cardiac ablation is a procedure to destroy (ablate) some heart tissue that is sending bad signals. These bad signals cause problems in heart rhythm. The heart has many areas that make these signals. If there are problems in these areas, they can make the heart beat in a way that is not normal. Destroying some tissues can help make the heart rhythm normal. Tell your doctor about: Any allergies you have. All medicines you are taking. These include vitamins, herbs, eye drops, creams, and over-the-counter medicines. Any problems you or family members have had with medicines that make you fall asleep (anesthetics). Any blood disorders you have. Any surgeries you have had. Any medical conditions you have, such as kidney failure. Whether you are pregnant or may be pregnant. What are the risks? This is a safe procedure. But problems may occur, including: Infection. Bruising and bleeding. Bleeding into the chest. Stroke or blood clots. Damage to nearby areas of your body. Allergies to medicines or dyes. The need for a pacemaker if the normal system is damaged. Failure of the procedure to treat the problem. What happens before the procedure? Medicines Ask your doctor about: Changing or   stopping your normal medicines. This is important. Taking aspirin and ibuprofen. Do not take these medicines unless your doctor tells you to take them. Taking other medicines, vitamins, herbs, and supplements. General  instructions Follow instructions from your doctor about what you cannot eat or drink. Plan to have someone take you home from the hospital or clinic. If you will be going home right after the procedure, plan to have someone with you for 24 hours. Ask your doctor what steps will be taken to prevent infection. What happens during the procedure?  An IV tube will be put into one of your veins. You will be given a medicine to help you relax. The skin on your neck or groin will be numbed. A cut (incision) will be made in your neck or groin. A needle will be put through your cut and into a large vein. A tube (catheter) will be put into the needle. The tube will be moved to your heart. Dye may be put through the tube. This helps your doctor see your heart. Small devices (electrodes) on the tube will send out signals. A type of energy will be used to destroy some heart tissue. The tube will be taken out. Pressure will be held on your cut. This helps stop bleeding. A bandage will be put over your cut. The exact procedure may vary among doctors and hospitals. What happens after the procedure? You will be watched until you leave the hospital or clinic. This includes checking your heart rate, breathing rate, oxygen, and blood pressure. Your cut will be watched for bleeding. You will need to lie still for a few hours. Do not drive for 24 hours or as long as your doctor tells you. Summary Cardiac ablation is a procedure to destroy some heart tissue. This is done to treat heart rhythm problems. Tell your doctor about any medical conditions you may have. Tell him or her about all medicines you are taking to treat them. This is a safe procedure. But problems may occur. These include infection, bruising, bleeding, and damage to nearby areas of your body. Follow what your doctor tells you about food and drink. You may also be told to change or stop some of your medicines. After the procedure, do not drive  for 24 hours or as long as your doctor tells you. This information is not intended to replace advice given to you by your health care provider. Make sure you discuss any questions you have with your health care provider. Document Revised: 01/22/2019 Document Reviewed: 01/22/2019 Elsevier Patient Education  2022 Elsevier Inc.         

## 2021-01-01 ENCOUNTER — Telehealth: Payer: Self-pay | Admitting: Medical

## 2021-01-01 NOTE — Telephone Encounter (Signed)
   Patient called the after hours line to report elevated blood pressures today. She reports going out of town to [place] over the past couple days but didn't have her blood pressure cuff with her.   Upon return home she check her blood pressure this morning and it was elevated to 156/110. She repeated her BP 1 hour later and it was 150/110.  She denies changes in eating habit over the last couple days.  Denies increased salt use.  She also notes her heart rate has been elevated today-according to her cardia mobile heart rate has been in the 120s.  She reports taking a manual pulse rate and it was in the 90s.  She has no specific complaints of chest pain, shortness of breath, palpitations, dizziness, lightheadedness, syncope.  She reports she has been taking metoprolol succinate 25 mg daily the past 2 days.  It appears she was previously on 50 mg daily which was discontinued due to low blood pressures with concomitant use of HCTZ.  HCTZ has since been discontinued.  Recommend increasing metoprolol succinate to 50 mg daily.  Encouraged ongoing close outpatient monitoring with twice daily blood pressures.  Recommended notifying office if blood pressures consistently elevated after couple days she may require further medication adjustments.  Will route to Dr. Johney Frame as Lorain Childes.  Beatriz Stallion, PA-C 01/01/21; 1:03 PM

## 2021-01-02 ENCOUNTER — Other Ambulatory Visit: Payer: Medicare Other | Admitting: *Deleted

## 2021-01-02 ENCOUNTER — Other Ambulatory Visit: Payer: Self-pay

## 2021-01-02 DIAGNOSIS — I483 Typical atrial flutter: Secondary | ICD-10-CM

## 2021-01-02 DIAGNOSIS — G4733 Obstructive sleep apnea (adult) (pediatric): Secondary | ICD-10-CM

## 2021-01-02 DIAGNOSIS — E785 Hyperlipidemia, unspecified: Secondary | ICD-10-CM

## 2021-01-02 DIAGNOSIS — I4819 Other persistent atrial fibrillation: Secondary | ICD-10-CM

## 2021-01-02 DIAGNOSIS — I10 Essential (primary) hypertension: Secondary | ICD-10-CM

## 2021-01-02 LAB — BASIC METABOLIC PANEL
BUN/Creatinine Ratio: 13 (ref 12–28)
BUN: 11 mg/dL (ref 8–27)
CO2: 26 mmol/L (ref 20–29)
Calcium: 9.4 mg/dL (ref 8.7–10.3)
Chloride: 98 mmol/L (ref 96–106)
Creatinine, Ser: 0.88 mg/dL (ref 0.57–1.00)
Glucose: 80 mg/dL (ref 70–99)
Potassium: 4.1 mmol/L (ref 3.5–5.2)
Sodium: 137 mmol/L (ref 134–144)
eGFR: 72 mL/min/{1.73_m2} (ref >59)

## 2021-01-02 LAB — CBC WITH DIFFERENTIAL/PLATELET
Basophils Absolute: 0.1 10*3/uL (ref 0.0–0.2)
Basos: 1 %
EOS (ABSOLUTE): 0.1 10*3/uL (ref 0.0–0.4)
Eos: 2 %
Hematocrit: 44.4 % (ref 34.0–46.6)
Hemoglobin: 15 g/dL (ref 11.1–15.9)
Immature Grans (Abs): 0 10*3/uL (ref 0.0–0.1)
Immature Granulocytes: 0 %
Lymphocytes Absolute: 1.8 10*3/uL (ref 0.7–3.1)
Lymphs: 33 %
MCH: 33.8 pg — ABNORMAL HIGH (ref 26.6–33.0)
MCHC: 33.8 g/dL (ref 31.5–35.7)
MCV: 100 fL — ABNORMAL HIGH (ref 79–97)
Monocytes Absolute: 0.8 10*3/uL (ref 0.1–0.9)
Monocytes: 14 %
Neutrophils Absolute: 2.8 10*3/uL (ref 1.4–7.0)
Neutrophils: 50 %
Platelets: 164 10*3/uL (ref 150–450)
RBC: 4.44 x10E6/uL (ref 3.77–5.28)
RDW: 12.7 % (ref 11.7–15.4)
WBC: 5.6 10*3/uL (ref 3.4–10.8)

## 2021-01-04 ENCOUNTER — Telehealth: Payer: Self-pay | Admitting: Internal Medicine

## 2021-01-04 MED ORDER — METOPROLOL SUCCINATE ER 50 MG PO TB24: ORAL_TABLET | ORAL | 3 refills | Status: DC

## 2021-01-04 NOTE — Telephone Encounter (Signed)
Spoke w patient. She is feeling symptomatic with her Afib.  She is feeling SOB if she tries to do anything and has a significant headache.  Feels her heart racing.  Her BPs yesterday 150/110, 155/75 and today 160/110, 162/112.  HRs 110s.  On 10/30 she spoke w APP who increased her Toprol XL from 25 mg to 50 mg.  Reviewed w Dr. Jenel Lucks nurse who will get recommendations and call patient back.

## 2021-01-04 NOTE — Telephone Encounter (Signed)
Per Otilio Saber PA. Will increase Toprol XL to 75 daily. The patient will take 50 mg q am and 25 mg q pm. Advised to monitor BP and HR and contact the office as needed based on readings.  Verbalized understanding.

## 2021-01-04 NOTE — Telephone Encounter (Signed)
Pt c/o BP issue: STAT if pt c/o blurred vision, one-sided weakness or slurred speech  1. What are your last 5 BP readings? 160/110 PULSE 86  2. Are you having any other symptoms (ex. Dizziness, headache, blurred vision, passed out)? HEADACHE ONLY NO DIZZINESS/BLURRED VISION OR PASSED OUT  3. What is your BP issue?  WED 140/102 AT 6AM WED 148/96 AT 9AM WED 156/110 AT 3PM  Patient c/o Palpitations:  High priority if patient c/o lightheadedness, shortness of breath, or chest pain  How long have you had palpitations/irregular HR/ Afib? Are you having the symptoms now? YES   Are you currently experiencing lightheadedness, SOB or CP? SOB  Do you have a history of afib (atrial fibrillation) or irregular heart rhythm? YES  Have you checked your BP or HR? (document readings if available):  160/110  Are you experiencing any other symptoms? HEADACHE

## 2021-01-09 ENCOUNTER — Telehealth (HOSPITAL_COMMUNITY): Payer: Self-pay | Admitting: *Deleted

## 2021-01-09 NOTE — Telephone Encounter (Signed)
Reaching out to patient to offer assistance regarding upcoming cardiac imaging study; pt verbalizes understanding of appt date/time, parking situation and where to check in, pre-test NPO status  and verified current allergies; name and call back number provided for further questions should they arise ? ?Lua Feng RN Navigator Cardiac Imaging ?[facility] ?[phone] office ?[phone] cell ? ?

## 2021-01-10 ENCOUNTER — Other Ambulatory Visit: Payer: Self-pay

## 2021-01-10 ENCOUNTER — Ambulatory Visit (HOSPITAL_COMMUNITY)
Admission: RE | Admit: 2021-01-10 | Discharge: 2021-01-10 | Disposition: A | Discharge status: Home / Self Care | Payer: Medicare Other | Source: Ambulatory Visit | Attending: Internal Medicine | Admitting: Internal Medicine

## 2021-01-10 DIAGNOSIS — I4819 Other persistent atrial fibrillation: Secondary | ICD-10-CM | POA: Diagnosis not present

## 2021-01-10 MED ORDER — METOPROLOL TARTRATE 5 MG/5ML IV SOLN
INTRAVENOUS | Status: AC
  Filled 2021-01-10: qty 5

## 2021-01-10 MED ORDER — IOHEXOL 350 MG/ML SOLN
95.0000 mL | Freq: Once | INTRAVENOUS | Status: AC | PRN
  Administered 2021-01-10: 95 mL via INTRAVENOUS

## 2021-01-14 ENCOUNTER — Other Ambulatory Visit: Payer: Self-pay | Admitting: Internal Medicine

## 2021-01-16 NOTE — Pre-Procedure Instructions (Signed)
Instructed patient on the following items: Arrival time 1130 Nothing to eat or drink after midnight No meds AM of procedure Responsible person to drive you home and stay with you for 24 hrs  Have you missed any doses of anti-coagulant Eliquis- hasn't missed any doses    

## 2021-01-17 ENCOUNTER — Ambulatory Visit (HOSPITAL_COMMUNITY)
Admission: RE | Admit: 2021-01-17 | Discharge: 2021-01-17 | Disposition: A | Discharge status: Home / Self Care | Payer: Medicare Other | Attending: Internal Medicine | Admitting: Internal Medicine

## 2021-01-17 ENCOUNTER — Ambulatory Visit (HOSPITAL_COMMUNITY)
Admission: RE | Disposition: A | Discharge status: Home / Self Care | Payer: Self-pay | Source: Home / Self Care | Attending: Internal Medicine

## 2021-01-17 ENCOUNTER — Other Ambulatory Visit: Payer: Self-pay

## 2021-01-17 ENCOUNTER — Ambulatory Visit (HOSPITAL_COMMUNITY): Payer: Medicare Other | Admitting: Anesthesiology

## 2021-01-17 DIAGNOSIS — I480 Paroxysmal atrial fibrillation: Secondary | ICD-10-CM | POA: Diagnosis not present

## 2021-01-17 DIAGNOSIS — Z7901 Long term (current) use of anticoagulants: Secondary | ICD-10-CM | POA: Diagnosis not present

## 2021-01-17 DIAGNOSIS — I4819 Other persistent atrial fibrillation: Secondary | ICD-10-CM | POA: Insufficient documentation

## 2021-01-17 DIAGNOSIS — I483 Typical atrial flutter: Secondary | ICD-10-CM | POA: Diagnosis not present

## 2021-01-17 HISTORY — PX: ATRIAL FIBRILLATION ABLATION: EP1191

## 2021-01-17 LAB — POCT ACTIVATED CLOTTING TIME
Activated Clotting Time: 329 seconds
Activated Clotting Time: 387 seconds

## 2021-01-17 SURGERY — ATRIAL FIBRILLATION ABLATION
Anesthesia: General

## 2021-01-17 MED ORDER — ACETAMINOPHEN 500 MG PO TABS
1000.0000 mg | ORAL_TABLET | Freq: Once | ORAL | Status: AC
  Administered 2021-01-17: 1000 mg via ORAL
  Filled 2021-01-17: qty 2

## 2021-01-17 MED ORDER — FENTANYL CITRATE (PF) 250 MCG/5ML IJ SOLN
INTRAMUSCULAR | Status: DC | PRN
  Administered 2021-01-17 (×2): 50 ug via INTRAVENOUS

## 2021-01-17 MED ORDER — HEPARIN (PORCINE) IN NACL 1000-0.9 UT/500ML-% IV SOLN
INTRAVENOUS | Status: AC
  Filled 2021-01-17: qty 500

## 2021-01-17 MED ORDER — HEPARIN SODIUM (PORCINE) 1000 UNIT/ML IJ SOLN
INTRAMUSCULAR | Status: AC
  Filled 2021-01-17: qty 2

## 2021-01-17 MED ORDER — ONDANSETRON HCL 4 MG/2ML IJ SOLN
INTRAMUSCULAR | Status: DC | PRN
  Administered 2021-01-17: 4 mg via INTRAVENOUS

## 2021-01-17 MED ORDER — SUGAMMADEX SODIUM 200 MG/2ML IV SOLN
INTRAVENOUS | Status: DC | PRN
  Administered 2021-01-17: 100 mg via INTRAVENOUS
  Administered 2021-01-17: 200 mg via INTRAVENOUS

## 2021-01-17 MED ORDER — ROCURONIUM BROMIDE 10 MG/ML (PF) SYRINGE
PREFILLED_SYRINGE | INTRAVENOUS | Status: DC | PRN
Start: 2021-01-17 — End: 2021-01-17
  Administered 2021-01-17: 60 mg via INTRAVENOUS
  Administered 2021-01-17: 20 mg via INTRAVENOUS

## 2021-01-17 MED ORDER — PROTAMINE SULFATE 10 MG/ML IV SOLN
INTRAVENOUS | Status: DC | PRN
  Administered 2021-01-17 (×2): 20 mg via INTRAVENOUS

## 2021-01-17 MED ORDER — SODIUM CHLORIDE 0.9% FLUSH: 3.0000 mL | Freq: Two times a day (BID) | INTRAVENOUS | Status: DC

## 2021-01-17 MED ORDER — LIDOCAINE 2% (20 MG/ML) 5 ML SYRINGE
INTRAMUSCULAR | Status: DC | PRN
  Administered 2021-01-17: 60 mg via INTRAVENOUS

## 2021-01-17 MED ORDER — HEPARIN (PORCINE) IN NACL 2000-0.9 UNIT/L-% IV SOLN
INTRAVENOUS | Status: DC | PRN
  Administered 2021-01-17: 15000 mL

## 2021-01-17 MED ORDER — PANTOPRAZOLE SODIUM 40 MG PO TBEC: 40.0000 mg | DELAYED_RELEASE_TABLET | Freq: Every day | ORAL | 0 refills | Status: DC

## 2021-01-17 MED ORDER — MIDAZOLAM HCL 2 MG/2ML IJ SOLN
INTRAMUSCULAR | Status: DC | PRN
  Administered 2021-01-17: 2 mg via INTRAVENOUS

## 2021-01-17 MED ORDER — HEPARIN (PORCINE) IN NACL 1000-0.9 UT/500ML-% IV SOLN
INTRAVENOUS | Status: DC | PRN
  Administered 2021-01-17 (×2): 500 mL

## 2021-01-17 MED ORDER — PHENYLEPHRINE 40 MCG/ML (10ML) SYRINGE FOR IV PUSH (FOR BLOOD PRESSURE SUPPORT)
PREFILLED_SYRINGE | INTRAVENOUS | Status: DC | PRN
  Administered 2021-01-17 (×2): 80 ug via INTRAVENOUS

## 2021-01-17 MED ORDER — PROPOFOL 10 MG/ML IV BOLUS
INTRAVENOUS | Status: DC | PRN
  Administered 2021-01-17: 40 mg via INTRAVENOUS
  Administered 2021-01-17: 160 mg via INTRAVENOUS

## 2021-01-17 MED ORDER — DEXAMETHASONE SODIUM PHOSPHATE 10 MG/ML IJ SOLN
INTRAMUSCULAR | Status: DC | PRN
  Administered 2021-01-17: 10 mg via INTRAVENOUS

## 2021-01-17 MED ORDER — HEPARIN SODIUM (PORCINE) 1000 UNIT/ML IJ SOLN
INTRAMUSCULAR | Status: DC | PRN
  Administered 2021-01-17: 1000 [IU] via INTRAVENOUS

## 2021-01-17 MED ORDER — SODIUM CHLORIDE 0.9 % IV SOLN: INTRAVENOUS | Status: DC

## 2021-01-17 MED ORDER — APIXABAN 5 MG PO TABS
5.0000 mg | ORAL_TABLET | Freq: Once | ORAL | Status: AC
  Administered 2021-01-17: 5 mg via ORAL
  Filled 2021-01-17: qty 1

## 2021-01-17 SURGICAL SUPPLY — 18 items
BLANKET WARM UNDERBOD FULL ACC (MISCELLANEOUS) ×3 IMPLANT
CATH OCTARAY 2.0 F 3-3-3-3-3 (CATHETERS) ×3 IMPLANT
CATH SMTCH THERMOCOOL SF DF (CATHETERS) ×3 IMPLANT
CATH SOUNDSTAR ECO 8FR (CATHETERS) ×3 IMPLANT
CATH WEBSTER BI DIR CS D-F CRV (CATHETERS) ×3 IMPLANT
CLOSURE PERCLOSE PROSTYLE (VASCULAR PRODUCTS) ×9 IMPLANT
COVER SWIFTLINK CONNECTOR (BAG) ×3 IMPLANT
MAT PREVALON FULL STRYKER (MISCELLANEOUS) ×3 IMPLANT
NEEDLE BAYLIS TRANSSEPTAL 71CM (NEEDLE) ×3 IMPLANT
PACK EP LATEX FREE (CUSTOM PROCEDURE TRAY) ×3
PACK EP LF (CUSTOM PROCEDURE TRAY) ×1 IMPLANT
PAD PRO RADIOLUCENT 2001M-C (PAD) ×3 IMPLANT
PATCH CARTO3 (PAD) ×3 IMPLANT
SHEATH PINNACLE 7F 10CM (SHEATH) ×6 IMPLANT
SHEATH PINNACLE 9F 10CM (SHEATH) ×3 IMPLANT
SHEATH PROBE COVER 6X72 (BAG) ×3 IMPLANT
SHEATH SWARTZ TS SL2 63CM 8.5F (SHEATH) ×3 IMPLANT
TUBING SMART ABLATE COOLFLOW (TUBING) ×3 IMPLANT

## 2021-01-17 NOTE — Transfer of Care (Signed)
Immediate Anesthesia Transfer of Care Note  Patient: Catherine Callahan  Procedure(s) Performed: ATRIAL FIBRILLATION ABLATION  Patient Location: PACU and Cath Lab  Anesthesia Type:General  Level of Consciousness: drowsy, patient cooperative and responds to stimulation  Airway & Oxygen Therapy: Patient Spontanous Breathing  Post-op Assessment: Report given to RN and Post -op Vital signs reviewed and stable  Post vital signs: Reviewed and stable  Last Vitals:  Vitals Value Taken Time  BP 136/97 01/17/21 1544  Temp    Pulse 64 01/17/21 1544  Resp 17 01/17/21 1544  SpO2 97 % 01/17/21 1544  Vitals shown include unvalidated device data.  Last Pain: There were no vitals filed for this visit.       Complications: No notable events documented.

## 2021-01-17 NOTE — Anesthesia Postprocedure Evaluation (Signed)
Anesthesia Post Note  Patient: Catherine Callahan  Procedure(s) Performed: ATRIAL FIBRILLATION ABLATION     Patient location during evaluation: PACU Anesthesia Type: General Level of consciousness: awake and alert Pain management: pain level controlled Vital Signs Assessment: post-procedure vital signs reviewed and stable Respiratory status: spontaneous breathing, nonlabored ventilation and respiratory function stable Cardiovascular status: blood pressure returned to baseline and stable Postop Assessment: no apparent nausea or vomiting Anesthetic complications: no   No notable events documented.  Last Vitals:  Vitals:   01/17/21 1624 01/17/21 1630  BP: (!) 153/90 (!) 147/85  Pulse: 67 67  Resp: 10 16  Temp: (!) 36.4 C   SpO2: 91% 93%    Last Pain:  Vitals:   01/17/21 1545  PainSc: 0-No pain                 Beryle Lathe

## 2021-01-17 NOTE — Anesthesia Preprocedure Evaluation (Addendum)
Anesthesia Evaluation  Patient identified by MRN, date of birth, ID band Patient awake    Reviewed: Allergy & Precautions, NPO status , Patient's Chart, lab work & pertinent test results, reviewed documented beta blocker date and time   History of Anesthesia Complications Negative for: history of anesthetic complications  Airway Mallampati: II  TM Distance: <3 FB Neck ROM: Full    Dental  (+) Dental Advisory Given, Teeth Intact   Pulmonary sleep apnea and Continuous Positive Airway Pressure Ventilation ,    Pulmonary exam normal        Cardiovascular hypertension, Pt. on medications and Pt. on home beta blockers + CAD  + dysrhythmias Atrial Fibrillation + Valvular Problems/Murmurs MR  Rhythm:Irregular Rate:Normal   '22 TTE - EF 60 to 65%. Grade II diastolic dysfunction  (pseudonormalization). Left atrial size was mildly dilated. Mild to moderate MR and TR. Mild aortic valve sclerosis is present, with no evidence of aortic valve stenosis.     Neuro/Psych negative neurological ROS  negative psych ROS   GI/Hepatic Neg liver ROS, PUD,  UC    Endo/Other   Obesity   Renal/GU negative Renal ROS     Musculoskeletal  (+) Arthritis ,   Abdominal   Peds  Hematology  On eliquis    Anesthesia Other Findings   Reproductive/Obstetrics                           Anesthesia Physical Anesthesia Plan  ASA: 3  Anesthesia Plan: General   Post-op Pain Management:    Induction: Intravenous  PONV Risk Score and Plan: 3 and Treatment may vary due to age or medical condition, Ondansetron and Dexamethasone  Airway Management Planned: Oral ETT  Additional Equipment: None  Intra-op Plan:   Post-operative Plan: Extubation in OR  Informed Consent: I have reviewed the patients History and Physical, chart, labs and discussed the procedure including the risks, benefits and alternatives for the proposed  anesthesia with the patient or authorized representative who has indicated his/her understanding and acceptance.     Dental advisory given  Plan Discussed with: CRNA and Anesthesiologist  Anesthesia Plan Comments:        Anesthesia Quick Evaluation

## 2021-01-17 NOTE — H&P (Signed)
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 female who presents today for electrophysiology study and ablation for afib.  Since last being seen in our clinic, the patient reports doing reasonably well.  Her afib has increased.  She has been documented to have afib.  Episodes occur every few days, lasting several hours.  + palpitations and fatigue. Today, she denies symptoms of chest pain, shortness of breath,  lower extremity edema, dizziness, presyncope, or syncope.  The patient is otherwise without complaint today.        Past Medical History:  Diagnosis Date   Arthritis     Atrial flutter ([place]) 01/05/2013   CAD (coronary artery disease) 07/01/2019   Hypertension, essential, benign 01/11/2011   OSA on CPAP 01/05/2013   Overweight     Paroxysmal atrial fibrillation (HCC)     Ulcerative colitis ([place])           Past Surgical History:  Procedure Laterality Date   ATRIAL FIBRILLATION ABLATION N/A 06/30/2019    Procedure: ATRIAL FIBRILLATION ABLATION;  Surgeon: Thompson Grayer, MD;  Location: [facility];  Service: Cardiovascular;  Laterality: N/A;   COLON SURGERY   2004    colon resection      ROS- all systems are reviewed and negatives except as per HPI above         Current Outpatient Medications  Medication Sig Dispense Refill   ELIQUIS 5 MG TABS tablet TAKE 1 TABLET BY MOUTH TWICE A DAY (Patient taking differently: Take 5 mg by mouth 2 (two) times daily.) 180 tablet 2   ezetimibe (ZETIA) 10 MG tablet Take 1 tablet (10 mg total) by mouth daily. 90 tablet 3   FIBER PO Take 4 capsules by mouth at bedtime.       losartan (COZAAR) 100 MG tablet Take 1 tablet (100 mg total) by mouth daily. 90 tablet 3   Mesalamine 800 MG TBEC Take 800 mg by mouth daily.        metoprolol succinate (TOPROL-XL) 50 MG 24 hr tablet Take 1 tablet (50 mg total) by mouth at bedtime. 90 tablet 3   Multiple Vitamin (MULTIVITAMIN WITH MINERALS) TABS tablet Take 1 tablet by mouth daily. Centrum  Silver       rosuvastatin (CRESTOR) 40 MG tablet Take 1 tablet (40 mg total) by mouth daily. 90 tablet 3    No current facility-administered medications for this visit.      Physical Exam:    GEN- The patient is well appearing, alert and oriented x 3 today.   Head- normocephalic, atraumatic Eyes-  Sclera clear, conjunctiva pink Ears- hearing intact Oropharynx- clear Lungs- Clear to ausculation bilaterally, normal work of breathing Heart- Regular rate and rhythm, no murmurs, rubs or gallops, PMI not laterally displaced GI- soft, NT, ND, + BS Extremities- no clubbing, cyanosis, or edema    Assessment and Plan:   Persistent atrial fibrillation/ atrial flutter Afib has increased  Chads2vasc score is 3.  She is on eliquis The patient has symptomatic, recurrent  atrial fibrillation. She is s/p ablation 06/30/19   Risk, benefits, and alternatives to EP study and radiofrequency ablation for afib were again discussed in detail today. These risks include but are not limited to stroke, bleeding, vascular damage, tamponade, perforation, damage to the esophagus, lungs, and other structures, pulmonary vein stenosis, worsening renal function, and death. The patient understands these risk and wishes to proceed.    Cardiac CT reviewed at length with the  patient today.  she reports compliance with OAC without interruption.  [name] MD, [facility] 01/17/2021 1:04 PM

## 2021-01-17 NOTE — Anesthesia Procedure Notes (Signed)
Procedure Name: Intubation Date/Time: 01/17/2021 1:31 PM Performed by: Betha Loa, CRNA Pre-anesthesia Checklist: Patient identified, Emergency Drugs available, Suction available and Patient being monitored Patient Re-evaluated:Patient Re-evaluated prior to induction Oxygen Delivery Method: Circle System Utilized Preoxygenation: Pre-oxygenation with 100% oxygen Induction Type: IV induction Ventilation: Mask ventilation without difficulty Laryngoscope Size: Mac and 3 Grade View: Grade I Tube type: Oral Tube size: 7.5 mm Number of attempts: 1 Airway Equipment and Method: Stylet and Oral airway Placement Confirmation: ETT inserted through vocal cords under direct vision, positive ETCO2 and breath sounds checked- equal and bilateral Secured at: 22 cm Tube secured with: Tape Dental Injury: Teeth and Oropharynx as per pre-operative assessment

## 2021-01-17 NOTE — Progress Notes (Signed)
Up and walked and tolerated well; right groin stable, no bleeding or hematoma; site bruised

## 2021-01-18 ENCOUNTER — Encounter (HOSPITAL_COMMUNITY): Payer: Self-pay | Admitting: Internal Medicine

## 2021-01-25 MED FILL — Heparin Sod (Porcine)-NaCl IV Soln 2000 Unit/L-0.9%: INTRAVENOUS | Qty: 1500 | Status: AC

## 2021-02-14 ENCOUNTER — Ambulatory Visit (HOSPITAL_COMMUNITY)
Admission: RE | Admit: 2021-02-14 | Discharge: 2021-02-14 | Disposition: A | Discharge status: Home / Self Care | Payer: Medicare Other | Source: Ambulatory Visit | Attending: Physician Assistant | Admitting: Physician Assistant

## 2021-02-14 ENCOUNTER — Encounter (HOSPITAL_COMMUNITY): Payer: Self-pay | Admitting: Physician Assistant

## 2021-02-14 ENCOUNTER — Other Ambulatory Visit: Payer: Self-pay

## 2021-02-14 VITALS — BP 116/72 | HR 72 | Ht 66.0 in | Wt 244.2 lb

## 2021-02-14 DIAGNOSIS — I480 Paroxysmal atrial fibrillation: Secondary | ICD-10-CM | POA: Insufficient documentation

## 2021-02-14 DIAGNOSIS — K5190 Ulcerative colitis, unspecified, without complications: Secondary | ICD-10-CM | POA: Diagnosis not present

## 2021-02-14 DIAGNOSIS — Z9989 Dependence on other enabling machines and devices: Secondary | ICD-10-CM | POA: Insufficient documentation

## 2021-02-14 DIAGNOSIS — I4892 Unspecified atrial flutter: Secondary | ICD-10-CM | POA: Diagnosis not present

## 2021-02-14 DIAGNOSIS — E669 Obesity, unspecified: Secondary | ICD-10-CM | POA: Diagnosis not present

## 2021-02-14 DIAGNOSIS — Z7182 Exercise counseling: Secondary | ICD-10-CM | POA: Diagnosis not present

## 2021-02-14 DIAGNOSIS — D6869 Other thrombophilia: Secondary | ICD-10-CM | POA: Diagnosis not present

## 2021-02-14 DIAGNOSIS — I440 Atrioventricular block, first degree: Secondary | ICD-10-CM | POA: Insufficient documentation

## 2021-02-14 DIAGNOSIS — Z6839 Body mass index (BMI) 39.0-39.9, adult: Secondary | ICD-10-CM | POA: Diagnosis not present

## 2021-02-14 DIAGNOSIS — I10 Essential (primary) hypertension: Secondary | ICD-10-CM | POA: Insufficient documentation

## 2021-02-14 DIAGNOSIS — I483 Typical atrial flutter: Secondary | ICD-10-CM | POA: Diagnosis not present

## 2021-02-14 DIAGNOSIS — Z7901 Long term (current) use of anticoagulants: Secondary | ICD-10-CM | POA: Insufficient documentation

## 2021-02-14 DIAGNOSIS — Z79899 Other long term (current) drug therapy: Secondary | ICD-10-CM | POA: Insufficient documentation

## 2021-02-14 DIAGNOSIS — G4733 Obstructive sleep apnea (adult) (pediatric): Secondary | ICD-10-CM | POA: Diagnosis not present

## 2021-02-14 NOTE — Progress Notes (Signed)
Primary Care Physician: Carol Ada, MD Primary Cardiologist: Dr Tamala Julian Primary Electrophysiologist: Dr Rayann Heman Referring Physician: Dr Darvin Neighbours is a 65 y.o. female with a history of atrial flutter, paroxysmal atrial fibrillation, OSA, HTN, and ulcerative colitis who presents for follow up in the [facility].  The patient was initially diagnosed with atrial flutter in 2012 and subsequently atrial fibrillation. She was maintaining on flecainide but had increasing episodes of afib with symptoms of SOB, fatigue, and palpitations. Patient is on Eliquis for a CHADS2VASC score of 3. She underwent afib and flutter ablation with Dr Rayann Heman on 06/30/19. Unfortunately, she continued to have symptomatic afib and had repeat ablation (both PVI and CTI) on 01/17/21.  On follow up today, patient reports that she initially had a lot of afib for the first week post ablation but only two episodes after that. The longest was 3-4 hours. She does feel her afib burden has improved. She denies CP, swallowing pain, or groin issues. No bleeding issues on anticoagulation.   Today, she denies symptoms of palpitations, chest pain, shortness of breath, orthopnea, PND, lower extremity edema, dizziness, presyncope, syncope, snoring, daytime somnolence, bleeding, or neurologic sequela. The patient is tolerating medications without difficulties and is otherwise without complaint today.    Atrial Fibrillation Risk Factors:  she does have symptoms or diagnosis of sleep apnea. she is compliant with CPAP therapy.   she has a BMI of Body mass index is 39.41 kg/m.Marland Kitchen Filed Weights   02/14/21 1033  Weight: 110.8 kg     Family History  Problem Relation Age of Onset   Healthy Brother    CAD Brother 63   Heart disease Brother    Other Brother        open heart surgery   Heart disease Mother    Alzheimer's disease Mother    Atrial fibrillation Father    Stroke Father     Healthy Sister      Atrial Fibrillation Management history:  Previous antiarrhythmic drugs: flecainide Previous cardioversions: none Previous ablations: 06/30/19 afib and flutter, 01/17/21 afib and flutter CHADS2VASC score: 3 Anticoagulation history: Eliquis   Past Medical History:  Diagnosis Date   Arthritis    Atrial flutter ([place]) 01/05/2013   CAD (coronary artery disease) 07/01/2019   Hypertension, essential, benign 01/11/2011   OSA on CPAP 01/05/2013   Overweight    Paroxysmal atrial fibrillation (HCC)    Ulcerative colitis ([place])    Past Surgical History:  Procedure Laterality Date   ATRIAL FIBRILLATION ABLATION N/A 06/30/2019   Procedure: ATRIAL FIBRILLATION ABLATION;  Surgeon: Thompson Grayer, MD;  Location: [place] [facility];  Service: Cardiovascular;  Laterality: N/A;   ATRIAL FIBRILLATION ABLATION N/A 01/17/2021   Procedure: ATRIAL FIBRILLATION ABLATION;  Surgeon: Thompson Grayer, MD;  Location: [place] [facility];  Service: Cardiovascular;  Laterality: N/A;   COLON SURGERY  2004   colon resection    Current Outpatient Medications  Medication Sig Dispense Refill   acetaminophen (TYLENOL) 500 MG tablet Take 1,000 mg by mouth every 6 (six) hours as needed for moderate pain.     ELIQUIS 5 MG TABS tablet TAKE 1 TABLET BY MOUTH TWICE A DAY 180 tablet 2   ezetimibe (ZETIA) 10 MG tablet Take 1 tablet (10 mg total) by mouth daily. 90 tablet 3   FIBER PO Take 4 capsules by mouth at bedtime.     losartan (COZAAR) 100 MG tablet TAKE 1 TABLET BY MOUTH EVERY  DAY 90 tablet 3   Mesalamine 800 MG TBEC Take 800 mg by mouth daily.      metoprolol succinate (TOPROL-XL) 50 MG 24 hr tablet Take 1 tablet (50 mg total) by mouth every morning AND 0.5 tablets (25 mg total) every evening. Take with or immediately following a meal.. 135 tablet 3   Multiple Vitamin (MULTIVITAMIN WITH MINERALS) TABS tablet Take 1 tablet by mouth daily. Centrum Silver     pantoprazole (PROTONIX) 40 MG tablet Take 1  tablet (40 mg total) by mouth daily. 45 tablet 0   rosuvastatin (CRESTOR) 40 MG tablet Take 1 tablet (40 mg total) by mouth daily. 90 tablet 3   No current facility-administered medications for this encounter.    Allergies  Allergen Reactions   Penicillin G Anaphylaxis   Penicillins Anaphylaxis   Meloxicam Hives   Latex Rash and Hives    Social History   Socioeconomic History   Marital status: Widowed    Spouse name: Not on file   Number of children: Not on file   Years of education: Not on file   Highest education level: Not on file  Occupational History   Not on file  Tobacco Use   Smoking status: Never   Smokeless tobacco: Never  Vaping Use   Vaping Use: Never used  Substance and Sexual Activity   Alcohol use: Yes    Alcohol/week: 4.0 - 6.0 standard drinks    Types: 4 - 6 Cans of beer per week    Comment: 2 beers 3-4 times a week 02/14/2021   Drug use: No   Sexual activity: Not on file  Other Topics Concern   Not on file  Social History Narrative   Pt lives in [place] alone.  Widowed.  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   Social Determinants of Health   Financial Resource Strain: Not on file  Food Insecurity: Not on file  Transportation Needs: Not on file  Physical Activity: Not on file  Stress: Not on file  Social Connections: Not on file  Intimate Partner Violence: Not on file     ROS- All systems are reviewed and negative except as per the HPI above.  Physical Exam: Vitals:   02/14/21 1033  BP: 116/72  Pulse: 72  Weight: 110.8 kg  Height: 5\' 6"  (1.676 m)    GEN- The patient is a well appearing obese female, alert and oriented x 3 today.   HEENT-head normocephalic, atraumatic, sclera clear, conjunctiva pink, hearing intact, trachea midline. Lungs- Clear to ausculation bilaterally, normal work of breathing Heart- Regular rate and rhythm, no murmurs, rubs or gallops   GI- soft, NT, ND, + BS Extremities- no clubbing, cyanosis, or edema MS- no significant deformity or atrophy Skin- no rash or lesion Psych- euthymic mood, full affect Neuro- strength and sensation are intact   Wt Readings from Last 3 Encounters:  02/14/21 110.8 kg  12/05/20 108.8 kg  09/28/20 108.9 kg    EKG today demonstrates  SR, 1st degree AV block Vent. rate 72 BPM PR interval 252 ms QRS duration 72 ms QT/QTcB 402/440 ms  Echo 10/12/20 demonstrated  1. Left ventricular ejection fraction, by estimation, is 60 to 65%. Left  ventricular ejection fraction by 3D volume is 64 %. The left ventricle has normal function. The left ventricle has no regional wall motion  abnormalities. Left ventricular diastolic   parameters are consistent with  Grade II diastolic dysfunction  (pseudonormalization). Elevated left atrial pressure.   2. Right ventricular systolic function is normal. The right ventricular  size is normal.   3. Left atrial size was mildly dilated.   4. The mitral valve is normal in structure. Mild to moderate mitral valve  regurgitation. No evidence of mitral stenosis.   5. Tricuspid valve regurgitation is mild to moderate.   6. The aortic valve is tricuspid. Aortic valve regurgitation is not  visualized. Mild aortic valve sclerosis is present, with no evidence of  aortic valve stenosis.   7. The inferior vena cava is normal in size with greater than 50%  respiratory variability, suggesting right atrial pressure of 3 mmHg.   Epic records are reviewed at length today  CHA2DS2-VASc Score = 3  The patient's score is based upon: CHF History: 0 HTN History: 1 Diabetes History: 0 Stroke History: 0 Vascular Disease History: 0 Age Score: 1 Gender Score: 1       ASSESSMENT AND PLAN: 1. Paroxysmal Atrial Fibrillation/typical atrial flutter The patient's CHA2DS2-VASc score is 3, indicating a 3.2% annual risk of stroke. S/p afib and flutter ablation 06/30/19. Flecainide  stopped post ablation 2/2 prolonged PR. S/p repeat afib and flutter ablation 01/17/21. Afib frequency decreasing as she recovers from ablation.  Continue Toprol 50 mg AM and 25 mg PM daily Continue Eliquis 5 mg BID with no missed doses for at least 3 months post ablation.   2. Secondary Hypercoagulable State (ICD10:  D68.69) The patient is at significant risk for stroke/thromboembolism based upon her CHA2DS2-VASc Score of 3.  Continue Apixaban (Eliquis).   3. Obesity Body mass index is 39.41 kg/m. Lifestyle modification was discussed and encouraged including regular physical activity and weight reduction.  4. Obstructive sleep apnea Patient reports compliance with CPAP therapy.  5. HTN Stable, no changes today.   Follow up with Dr Johney Frame for post ablation.    Jorja Loa PA-C [facility] [address] [place], [place] [address] [phone] 02/14/2021 11:22 AM

## 2021-02-24 ENCOUNTER — Other Ambulatory Visit: Payer: Self-pay | Admitting: Internal Medicine

## 2021-03-12 ENCOUNTER — Encounter: Payer: Self-pay | Admitting: Internal Medicine

## 2021-03-30 ENCOUNTER — Other Ambulatory Visit: Payer: Self-pay

## 2021-03-30 ENCOUNTER — Ambulatory Visit
Admission: RE | Admit: 2021-03-30 | Discharge: 2021-03-30 | Disposition: A | Discharge status: Home / Self Care | Payer: Medicare Other | Source: Ambulatory Visit | Attending: Family Medicine | Admitting: Family Medicine

## 2021-03-30 DIAGNOSIS — E2839 Other primary ovarian failure: Secondary | ICD-10-CM

## 2021-04-21 ENCOUNTER — Other Ambulatory Visit: Payer: Self-pay

## 2021-04-21 ENCOUNTER — Ambulatory Visit (INDEPENDENT_AMBULATORY_CARE_PROVIDER_SITE_OTHER): Payer: Medicare Other | Admitting: Internal Medicine

## 2021-04-21 ENCOUNTER — Encounter: Payer: Self-pay | Admitting: Internal Medicine

## 2021-04-21 VITALS — BP 110/72 | HR 73 | Ht 66.0 in | Wt 246.0 lb

## 2021-04-21 DIAGNOSIS — I480 Paroxysmal atrial fibrillation: Secondary | ICD-10-CM | POA: Diagnosis not present

## 2021-04-21 DIAGNOSIS — D6869 Other thrombophilia: Secondary | ICD-10-CM

## 2021-04-21 DIAGNOSIS — I10 Essential (primary) hypertension: Secondary | ICD-10-CM

## 2021-04-21 DIAGNOSIS — G4733 Obstructive sleep apnea (adult) (pediatric): Secondary | ICD-10-CM

## 2021-04-21 MED ORDER — METOPROLOL SUCCINATE ER 50 MG PO TB24: 50.0000 mg | ORAL_TABLET | Freq: Every day | ORAL | 3 refills | Status: DC

## 2021-04-21 NOTE — Patient Instructions (Addendum)
Medication Instructions:  Your physician has recommended you make the following change in your medication:    DECREASE your Toprol XL-  Take 50 mg by mouth daily  Labwork: None ordered.  Testing/Procedures: None ordered.  Follow-Up: Your physician wants you to follow-up in: 6 months with Dr. Vallery Ridge will receive a reminder letter in the mail two months in advance. If you don't receive a letter, please call our office to schedule the follow-up appointment.    Any Other Special Instructions Will Be Listed Below (If Applicable).  If you need a refill on your cardiac medications before your next appointment, please call your pharmacy.

## 2021-04-21 NOTE — Progress Notes (Signed)
PCP: Carol Ada, MD Primary Cardiologist: Dr Carin Hock is a 66 y.o. female who presents today for routine electrophysiology followup.  Since his recent afib ablation, the patient reports doing very well.  she denies procedure related complications and is pleased with the results of the procedure.  Today, she denies symptoms of palpitations, chest pain, shortness of breath,  lower extremity edema, dizziness, presyncope, or syncope.  The patient is otherwise without complaint today.   Past Medical History:  Diagnosis Date   Arthritis    Atrial flutter ([place]) 01/05/2013   CAD (coronary artery disease) 07/01/2019   Hypertension, essential, benign 01/11/2011   OSA on CPAP 01/05/2013   Overweight    Paroxysmal atrial fibrillation (HCC)    Ulcerative colitis ([place])    Past Surgical History:  Procedure Laterality Date   ATRIAL FIBRILLATION ABLATION N/A 06/30/2019   Procedure: ATRIAL FIBRILLATION ABLATION;  Surgeon: Thompson Grayer, MD;  Location: [place] [facility];  Service: Cardiovascular;  Laterality: N/A;   ATRIAL FIBRILLATION ABLATION N/A 01/17/2021   Procedure: ATRIAL FIBRILLATION ABLATION;  Surgeon: Thompson Grayer, MD;  Location: [facility];  Service: Cardiovascular;  Laterality: N/A;   COLON SURGERY  2004   colon resection    ROS- all systems are personally reviewed and negatives except as per HPI above  Current Outpatient Medications  Medication Sig Dispense Refill   acetaminophen (TYLENOL) 500 MG tablet Take 1,000 mg by mouth every 6 (six) hours as needed for moderate pain.     ELIQUIS 5 MG TABS tablet TAKE 1 TABLET BY MOUTH TWICE A DAY 180 tablet 2   ezetimibe (ZETIA) 10 MG tablet Take 1 tablet (10 mg total) by mouth daily. 90 tablet 3   FIBER PO Take 4 capsules by mouth at bedtime.     losartan (COZAAR) 100 MG tablet TAKE 1 TABLET BY MOUTH EVERY DAY 90 tablet 3   Mesalamine 800 MG TBEC Take 800 mg by mouth daily.      metoprolol succinate (TOPROL-XL) 50  MG 24 hr tablet Take 1 tablet (50 mg total) by mouth every morning AND 0.5 tablets (25 mg total) every evening. Take with or immediately following a meal.. 135 tablet 3   Multiple Vitamin (MULTIVITAMIN WITH MINERALS) TABS tablet Take 1 tablet by mouth daily. Centrum Silver     rosuvastatin (CRESTOR) 40 MG tablet Take 1 tablet (40 mg total) by mouth daily. 90 tablet 3   No current facility-administered medications for this visit.    Physical Exam: Vitals:   04/21/21 1219  BP: 110/72  Pulse: 73  SpO2: 94%  Weight: 246 lb (111.6 kg)  Height: 5\' 6"  (1.676 m)    GEN- The patient is well appearing, alert and oriented x 3 today.   Head- normocephalic, atraumatic Eyes-  Sclera clear, conjunctiva pink Ears- hearing intact Oropharynx- clear Lungs- Clear to ausculation bilaterally, normal work of breathing Heart- Regular rate and rhythm, no murmurs, rubs or gallops, PMI not laterally displaced GI- soft, NT, ND, + BS Extremities- no clubbing, cyanosis, or edema  EKG tracing ordered today is personally reviewed and shows sinus rhythm 73 bpm, PR 240 smec  Assessment and Plan:  1. Paroxysmal atrial fibrillation/ atrial flutter Doing well s/p ablation chads2vasc score is 3.  Continue eliquis Reduce toprol to 50mg  daily.  Consider weaning further on return  2. HTN Stable No change required today  3. OSA Uses CPAP  4. obesity Body mass index is 39.71 kg/m. Lifestyle modification  is advised  5. First degree AV block PR 240 msec Reduce toprol to 50mg  daily Consider reducing to 25mg  daily on return  Return to see me in 6 months  Thompson Grayer MD, [facility] 04/21/2021 12:20 PM

## 2021-05-22 ENCOUNTER — Other Ambulatory Visit: Payer: Self-pay | Admitting: Internal Medicine

## 2021-05-22 ENCOUNTER — Other Ambulatory Visit: Payer: Self-pay | Admitting: Interventional Cardiology

## 2021-05-31 ENCOUNTER — Other Ambulatory Visit: Payer: Self-pay | Admitting: Interventional Cardiology

## 2021-05-31 DIAGNOSIS — I4891 Unspecified atrial fibrillation: Secondary | ICD-10-CM

## 2021-05-31 NOTE — Telephone Encounter (Signed)
Eliquis 5mg  refill request received. Patient is 66 years old, weight-111.6kg, Crea-0.88 on 01/02/2021, Diagnosis-Afib, and last seen by Dr. Rayann Heman on 04/21/2021. Dose is appropriate based on dosing criteria. Will send in refill to requested pharmacy.   ?

## 2021-06-13 DIAGNOSIS — Z20822 Contact with and (suspected) exposure to covid-19: Secondary | ICD-10-CM | POA: Diagnosis not present

## 2021-07-06 ENCOUNTER — Other Ambulatory Visit: Payer: Self-pay | Admitting: Interventional Cardiology

## 2021-07-25 NOTE — Progress Notes (Signed)
Cardiology Office Note:    Date:  07/26/2021   ID:  Catherine Callahan, DOB 05-Feb-1956, MRN [id number]  PCP:  Carol Ada, MD  Cardiologist:  Sinclair Grooms, MD   Referring MD: Carol Ada, MD   Chief Complaint  Patient presents with   Atrial Fibrillation   Hypertension   Hyperlipidemia   Coronary Artery Disease    History of Present Illness:    Catherine Callahan is a 66 y.o. female with a hx of paroxysmal atrial fibrillation now status post ablation 01/17/2021, flecainide therapy was discontinued after ablation, obesity, essential hypertension, hyperlipidemia, asymptomatic CAD (CAC 548), and obstructive sleep apnea.  She is doing well.  She has successful ablation in November.  She is not on antiarrhythmic therapy now.  No episodes of atrial fibrillation.  No complications.  We did identify a calcium score of 548 on the preablation imaging.  We were treating her as expected with quiescent underlying coronary disease doing all the things necessary to prevent future events.  Past Medical History:  Diagnosis Date   Arthritis    Atrial flutter ([place]) 01/05/2013   CAD (coronary artery disease) 07/01/2019   Hypertension, essential, benign 01/11/2011   OSA on CPAP 01/05/2013   Overweight    Paroxysmal atrial fibrillation (HCC)    Ulcerative colitis ([place])     Past Surgical History:  Procedure Laterality Date   ATRIAL FIBRILLATION ABLATION N/A 06/30/2019   Procedure: ATRIAL FIBRILLATION ABLATION;  Surgeon: Thompson Grayer, MD;  Location: [facility];  Service: Cardiovascular;  Laterality: N/A;   ATRIAL FIBRILLATION ABLATION N/A 01/17/2021   Procedure: ATRIAL FIBRILLATION ABLATION;  Surgeon: Thompson Grayer, MD;  Location: [place] [facility];  Service: Cardiovascular;  Laterality: N/A;   COLON SURGERY  2004   colon resection    Current Medications: Current Meds  Medication Sig   acetaminophen (TYLENOL) 500 MG tablet Take 1,000 mg by mouth every 6 (six) hours as needed  for moderate pain.   acyclovir (ZOVIRAX) 200 MG capsule Take 200 mg by mouth as needed.   ELIQUIS 5 MG TABS tablet TAKE 1 TABLET BY MOUTH TWICE A DAY   ezetimibe (ZETIA) 10 MG tablet TAKE 1 TABLET BY MOUTH EVERY DAY   FIBER PO Take 4 capsules by mouth at bedtime.   losartan (COZAAR) 100 MG tablet TAKE 1 TABLET BY MOUTH EVERY DAY   Mesalamine 800 MG TBEC Take 800 mg by mouth daily.    metoprolol succinate (TOPROL-XL) 50 MG 24 hr tablet Take 1 tablet (50 mg total) by mouth daily. Take with or immediately following a meal.   Multiple Vitamin (MULTIVITAMIN WITH MINERALS) TABS tablet Take 1 tablet by mouth daily. Centrum Silver   rosuvastatin (CRESTOR) 40 MG tablet TAKE 1 TABLET BY MOUTH EVERY DAY     Allergies:   Penicillin g, Penicillins, Meloxicam, and Latex   Social History   Socioeconomic History   Marital status: Widowed    Spouse name: Not on file   Number of children: Not on file   Years of education: Not on file   Highest education level: Not on file  Occupational History   Not on file  Tobacco Use   Smoking status: Never    Passive exposure: Never   Smokeless tobacco: Never  Vaping Use   Vaping Use: Never used  Substance and Sexual Activity   Alcohol use: Yes    Alcohol/week: 4.0 - 6.0 standard drinks    Types: 4 - 6 Cans of  beer per week    Comment: 2 beers 3-4 times a week 02/14/2021   Drug use: No   Sexual activity: Not on file  Other Topics Concern   Not on file  Social History Narrative   Pt lives in [place] alone.  Widowed.  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   Social Determinants of Health   Financial Resource Strain: Not on file  Food Insecurity: Not on file  Transportation Needs: Not on file  Physical Activity: Not on file  Stress: Not on file  Social Connections: Not on file     Family History: The patient's family history includes Alzheimer's disease in her mother;  Atrial fibrillation in her father; CAD (age of onset: 68) in her brother; Healthy in her brother and sister; Heart disease in her brother and mother; Other in her brother; Stroke in her father.  ROS:   Please see the history of present illness.    Current therapy without side effects.  She denies dyspnea.  She does have grade 2 diastolic dysfunction on echo from August 2022.  We did not discuss the possibility of SGLT2 therapy because there is no significant complaint of dyspnea.  All other systems reviewed and are negative.  EKGs/Labs/Other Studies Reviewed:    The following studies were reviewed today: 2D Doppler echocardiogram August 2022: IMPRESSIONS   1. Left ventricular ejection fraction, by estimation, is 60 to 65%. Left  ventricular ejection fraction by 3D volume is 64 %. The left ventricle has  normal function. The left ventricle has no regional wall motion  abnormalities. Left ventricular diastolic   parameters are consistent with Grade II diastolic dysfunction  (pseudonormalization). Elevated left atrial pressure.   2. Right ventricular systolic function is normal. The right ventricular  size is normal.   3. Left atrial size was mildly dilated.   4. The mitral valve is normal in structure. Mild to moderate mitral valve  regurgitation. No evidence of mitral stenosis.   5. Tricuspid valve regurgitation is mild to moderate.   6. The aortic valve is tricuspid. Aortic valve regurgitation is not  visualized. Mild aortic valve sclerosis is present, with no evidence of  aortic valve stenosis.   7. The inferior vena cava is normal in size with greater than 50%  respiratory variability, suggesting right atrial pressure of 3 mmHg.   EKG:  EKG not performed  Recent Labs: 09/29/2020: ALT 25 01/02/2021: BUN 11; Creatinine, Ser 0.88; Hemoglobin 15.0; Platelets 164; Potassium 4.1; Sodium 137  Recent Lipid Panel    Component Value Date/Time   CHOL 136 09/29/2020 0936   TRIG 77  09/29/2020 0936   HDL 59 09/29/2020 0936   CHOLHDL 2.3 09/29/2020 0936   CHOLHDL 3.3 04/10/2010 0340   VLDL 77 (H) 04/10/2010 0340   LDLCALC 62 09/29/2020 0936    Physical Exam:    VS:  BP 112/70   Pulse 74   Ht 5\' 6"  (1.676 m)   Wt 241 lb 6.4 oz (109.5 kg)   SpO2 96%   BMI 38.96 kg/m     Wt Readings from Last 3 Encounters:  07/26/21 241 lb 6.4 oz (109.5 kg)  04/21/21 246 lb (111.6 kg)  02/14/21 244 lb 3.2 oz (110.8 kg)     GEN: Moderate obesity. No acute distress HEENT: Normal NECK: No JVD. LYMPHATICS: No lymphadenopathy CARDIAC: No murmur. RRR no gallop, or edema. VASCULAR:  Normal Pulses.  No bruits. RESPIRATORY:  Clear to auscultation without rales, wheezing or rhonchi  ABDOMEN: Soft, non-tender, non-distended, No pulsatile mass, MUSCULOSKELETAL: No deformity  SKIN: Warm and dry NEUROLOGIC:  Alert and oriented x 3 PSYCHIATRIC:  Normal affect   ASSESSMENT:    1. Paroxysmal atrial fibrillation (HCC)   2. Acquired thrombophilia ([place])   3. Essential hypertension   4. Morbid obesity ([place])   5. Obstructive sleep apnea   6. Hyperlipidemia, unspecified hyperlipidemia type    PLAN:    In order of problems listed above:  Maintaining normal sinus rhythm based upon auscultation.  She will see Dr. Rayann Heman in the fall.  She does not believe there have been any episodes of atrial fibrillation.  She is physically active.  Her medication regimen has been significantly tapered.  She may be a candidate at some point in the future for discontinuation of anticoagulation if we have" no burden of atrial fibrillation".  Hopefully Dr. Rayann Heman will give his opinion concerning this at their office visit which will be 1 year from ablation. Continue apixaban.  Entertain whether the therapy can be discontinued. Excellent blood pressure control on minimal therapy.  Exercise, weight loss, and diet is helping to control. She is exercising and working hard to reduce weight. She is compliant with  CPAP. Continue Zetia and Crestor.  Most recent LDL in July 2022 was 62.  I believe her most recent lipids have been followed by Dr. Nancy Fetter.  Overall education and awareness concerning secondary risk prevention was discussed in detail: LDL less than 70, hemoglobin A1c less than 7, blood pressure target less than 130/80 mmHg, >150 minutes of moderate aerobic activity per week, avoidance of smoking, weight control (via diet and exercise), and continued surveillance/management of/for obstructive sleep apnea.    Medication Adjustments/Labs and Tests Ordered: Current medicines are reviewed at length with the patient today.  Concerns regarding medicines are outlined above.  No orders of the defined types were placed in this encounter.  No orders of the defined types were placed in this encounter.   Patient Instructions  Medication Instructions:  Your physician recommends that you continue on your current medications as directed. Please refer to the Current Medication list given to you today.  *If you need a refill on your cardiac medications before your next appointment, please call your pharmacy*  Lab Work: NONE  Testing/Procedures: NONE  Follow-Up: At [facility], you and your health needs are our priority.  As part of our continuing mission to provide you with exceptional heart care, we have created designated Provider Care Teams.  These Care Teams include your primary Cardiologist (physician) and Advanced Practice Providers (APPs -  Physician Assistants and Nurse Practitioners) who all work together to provide you with the care you need, when you need it.  Your next appointment:   1 year(s)  The format for your next appointment:   In Person  Provider:   Sinclair Grooms, MD {   Important Information About Sugar         Signed, Sinclair Grooms, MD  07/26/2021 8:27 AM    [facility]

## 2021-07-26 ENCOUNTER — Encounter: Payer: Self-pay | Admitting: Interventional Cardiology

## 2021-07-26 ENCOUNTER — Ambulatory Visit (INDEPENDENT_AMBULATORY_CARE_PROVIDER_SITE_OTHER): Payer: Medicare Other | Admitting: Interventional Cardiology

## 2021-07-26 VITALS — BP 112/70 | HR 74 | Ht 66.0 in | Wt 241.4 lb

## 2021-07-26 DIAGNOSIS — I480 Paroxysmal atrial fibrillation: Secondary | ICD-10-CM | POA: Diagnosis not present

## 2021-07-26 DIAGNOSIS — I10 Essential (primary) hypertension: Secondary | ICD-10-CM | POA: Diagnosis not present

## 2021-07-26 DIAGNOSIS — G4733 Obstructive sleep apnea (adult) (pediatric): Secondary | ICD-10-CM

## 2021-07-26 DIAGNOSIS — D6869 Other thrombophilia: Secondary | ICD-10-CM | POA: Diagnosis not present

## 2021-07-26 DIAGNOSIS — E785 Hyperlipidemia, unspecified: Secondary | ICD-10-CM

## 2021-07-26 NOTE — Patient Instructions (Signed)

## 2021-09-18 ENCOUNTER — Telehealth: Payer: Self-pay | Admitting: Interventional Cardiology

## 2021-09-18 NOTE — Telephone Encounter (Signed)
Pt c/o medication issue:  1. Name of Medication:  ELIQUIS 5 MG TABS tablet  2. How are you currently taking this medication (dosage and times per day)?   3. Are you having a reaction (difficulty breathing--STAT)?   4. What is your medication issue?   Patient states the medication will cost her over $150/1 month supply and her pharmacy recommended checking with our office to see if we are able to provide a coupon. Please advise.

## 2021-09-18 NOTE — Telephone Encounter (Signed)
Returned call to patient.  Provided patient with number for [employer] for Eliquis patient assistance ([phone]). Advised patient to call this number to see if she qualifies for assistance for Eliquis and to reach out to our office if she has any trouble thereafter. Patient verbalized understanding and expressed appreciation for assistance.

## 2021-10-11 ENCOUNTER — Encounter: Payer: Self-pay | Admitting: Cardiology

## 2021-11-21 ENCOUNTER — Telehealth: Payer: Self-pay

## 2021-11-21 NOTE — Patient Outreach (Signed)
  Care Coordination   11/21/2021 Name: Catherine Callahan MRN: [id number] DOB: [birth year]   Care Coordination Outreach Attempts:  An unsuccessful telephone outreach was attempted today to offer the patient information about available care coordination services as a benefit of their health plan.   Follow Up Plan:  Additional outreach attempts will be made to offer the patient care coordination information and services.   Encounter Outcome:  No Answer  Care Coordination Interventions Activated:  No   Care Coordination Interventions:  No, not indicated    Jone Baseman, RN, MSN [facility] Care Management Care Management Coordinator Direct Line [phone]

## 2021-11-29 ENCOUNTER — Telehealth: Payer: Self-pay

## 2021-11-29 NOTE — Patient Outreach (Signed)
  Care Coordination   11/29/2021 Name: Catherine Callahan MRN: [id number] DOB: 1955-10-15   Care Coordination Outreach Attempts:  A second unsuccessful outreach was attempted today to offer the patient with information about available care coordination services as a benefit of their health plan.     Follow Up Plan:  Additional outreach attempts will be made to offer the patient care coordination information and services.   Encounter Outcome:  No Answer  Care Coordination Interventions Activated:  No   Care Coordination Interventions:  No, not indicated    Jone Baseman, RN, MSN [facility] Care Management Care Management Coordinator Direct Line [phone]

## 2021-11-30 ENCOUNTER — Telehealth: Payer: Self-pay

## 2021-11-30 NOTE — Patient Instructions (Signed)
Visit Information  Thank you for taking time to visit with me today. Please don't hesitate to contact me if I can be of assistance to you.   Following are the goals we discussed today:   Goals Addressed             This Visit's Progress    COMPLETED: Care Coordination Activities-No follow up required       Care Coordination Interventions: Advised patient to schedule annual wellness visit            If you are experiencing a Mental Health or [place] or need someone to talk to, please call the Suicide and Crisis Lifeline: 988   Patient verbalizes understanding of instructions and care plan provided today and agrees to view in [place]. Active MyChart status and patient understanding of how to access instructions and care plan via MyChart confirmed with patient.     No further follow up required: patient decline at this time  Jone Baseman, RN, MSN [place] [facility] Care Management Coordinator Direct Line [phone]

## 2021-11-30 NOTE — Patient Outreach (Signed)
  Care Coordination   Initial Visit Note   11/30/2021 Name: Catherine Callahan MRN: [id number] DOB: 12-08-1955  Catherine Callahan is a 67 y.o. year old female who sees Carol Ada, MD for primary care. I spoke with  Freddie Apley by phone today.  What matters to the patients health and wellness today?  None. Reports doing well.    Goals Addressed             This Visit's Progress    COMPLETED: Care Coordination Activities-No follow up required       Care Coordination Interventions: Advised patient to schedule annual wellness visit          SDOH assessments and interventions completed:  Yes     Care Coordination Interventions Activated:  Yes  Care Coordination Interventions:  Yes, provided   Follow up plan: No further intervention required.   Encounter Outcome:  Pt. Visit Completed   Jone Baseman, RN, MSN [place] Management Care Management Coordinator Direct Line [phone]

## 2021-12-03 ENCOUNTER — Encounter: Payer: Self-pay | Admitting: Interventional Cardiology

## 2021-12-03 DIAGNOSIS — I10 Essential (primary) hypertension: Secondary | ICD-10-CM

## 2021-12-04 MED ORDER — HYDROCHLOROTHIAZIDE 12.5 MG PO CAPS: 12.5000 mg | ORAL_CAPSULE | ORAL | 3 refills | Status: DC

## 2021-12-04 NOTE — Addendum Note (Signed)
Addended by: Molli Barrows on: 12/04/2021 06:37 PM   Modules accepted: Orders

## 2021-12-17 NOTE — Progress Notes (Unsigned)
Cardiology Office Note:    Date:  12/18/2021   ID:  Catherine Callahan, DOB 1955-10-16, MRN [id number]  PCP:  Carol Ada, [place] Prov[facility] Cardiologist:  Sinclair Grooms, MD Electrophysiologist:  Melida Quitter, MD  Sleep Medicine:  Fransico Him, MD     Referring MD: Carol Ada, MD   Chief complaint: routine follow-up -no acute complaints  History of Present Illness:    Catherine Callahan is a 66 y.o. 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  She was diagnosed with atrial fibrillation in about 2014. She failed flecainide. She is very symptomatic with AF -- fatigue, shortness of breath, and palpitations.  She underwent atrial fibrillation ablation in April 2021 and underwent a repeat ablation in November 2022 after recurrence.  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  She was last seen in clinic by Dr. Rayann Heman in February 2023. At that time, she reported that she had been doing well from an arrhythmia standpoint.   Past Medical History:  Diagnosis Date   Arthritis    Atrial flutter ([place]) 01/05/2013   CAD (coronary artery disease) 07/01/2019   Hypertension, essential, benign 01/11/2011   OSA on CPAP 01/05/2013   Overweight    Paroxysmal atrial fibrillation (HCC)    Ulcerative colitis ([place])     Past Surgical History:  Procedure Laterality Date   ATRIAL FIBRILLATION ABLATION N/A 06/30/2019   Procedure: ATRIAL FIBRILLATION ABLATION;  Surgeon: Thompson Grayer, MD;  Location: [place] [facility];  Service: Cardiovascular;  Laterality: N/A;   ATRIAL FIBRILLATION ABLATION N/A 01/17/2021   Procedure: ATRIAL FIBRILLATION ABLATION;  Surgeon: Thompson Grayer, MD;  Location: [place] [facility];  Service: Cardiovascular;   Laterality: N/A;   COLON SURGERY  2004   colon resection    Current Medications: Current Meds  Medication Sig   acetaminophen (TYLENOL) 500 MG tablet Take 1,000 mg by mouth every 6 (six) hours as needed for moderate pain.   acyclovir (ZOVIRAX) 200 MG capsule Take 200 mg by mouth as needed.   ELIQUIS 5 MG TABS tablet TAKE 1 TABLET BY MOUTH TWICE A DAY   ezetimibe (ZETIA) 10 MG tablet TAKE 1 TABLET BY MOUTH EVERY DAY   FIBER PO Take 4 capsules by mouth at bedtime.   hydrochlorothiazide (MICROZIDE) 12.5 MG capsule Take 1 capsule (12.5 mg total) by mouth 3 (three) times a week. Take on Mondays, Wednesdays and Fridays. May increase to daily if BP remains consistently over 140/90 mmHg.   losartan (COZAAR) 100 MG tablet TAKE 1 TABLET BY MOUTH EVERY DAY   Mesalamine 800 MG TBEC Take 800 mg by mouth daily.    Multiple Vitamin (MULTIVITAMIN WITH MINERALS) TABS tablet Take 1 tablet by mouth daily. Centrum Silver   rosuvastatin (CRESTOR) 40 MG tablet TAKE 1 TABLET BY MOUTH EVERY DAY     Allergies:   Penicillin g, Penicillins, Meloxicam, and Latex   Social History   Socioeconomic History   Marital status: Widowed    Spouse name: Not on file   Number of children: Not on file   Years of education: Not on file   Highest education level: Not on file  Occupational History   Not on file  Tobacco Use   Smoking  status: Never    Passive exposure: Never   Smokeless tobacco: Never  Vaping Use   Vaping Use: Never used  Substance and Sexual Activity   Alcohol use: Yes    Alcohol/week: 4.0 - 6.0 standard drinks of alcohol    Types: 4 - 6 Cans of beer per week    Comment: 2 beers 3-4 times a week 02/14/2021   Drug use: No   Sexual activity: Not on file  Other Topics Concern   Not on file  Social History Narrative   Pt lives in [place] alone.  Widowed.  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   Social Determinants of  Health   Financial Resource Strain: Not on file  Food Insecurity: Not on file  Transportation Needs: Not on file  Physical Activity: Not on file  Stress: Not on file  Social Connections: Not on file     Family History: The patient's family history includes Alzheimer's disease in her mother; Atrial fibrillation in her father; CAD (age of onset: 33) in her brother; Healthy in her brother and sister; Heart disease in her brother and mother; Other in her brother; Stroke in her father.  ROS:   Please see the history of present illness.    All other systems reviewed and are negative.  EKGs/Labs/Other Studies Reviewed:     EKG:  Last EKG results: today -- SR 77 bpm with first degree AV block  Recent Labs: 01/02/2021: BUN 11; Creatinine, Ser 0.88; Hemoglobin 15.0; Platelets 164; Potassium 4.1; Sodium 137    Risk Assessment/Calculations:    CHA2DS2-VASc Score = 3   This indicates a 3.2% annual risk of stroke. The patient's score is based upon: CHF History: 0 HTN History: 1 Diabetes History: 0 Stroke History: 0 Vascular Disease History: 0 Age Score: 1 Gender Score: 1          Physical Exam:    VS:  BP 104/60   Pulse 77   Ht 5' 6"  (1.676 m)   Wt 245 lb 9.6 oz (111.4 kg)   SpO2 96%   BMI 39.64 kg/m     Wt Readings from Last 3 Encounters:  12/18/21 245 lb 9.6 oz (111.4 kg)  07/26/21 241 lb 6.4 oz (109.5 kg)  04/21/21 246 lb (111.6 kg)     GEN:  Well nourished, well developed in no acute distress CARDIAC: RRR, no murmurs, rubs, gallops RESPIRATORY:  Normal work of breathing MUSCULOSKELETAL: no edema    ASSESSMENT & PLAN:    Paroxysmal atrial fibrillation and atrial flutter: doing well, two brief episodes in the past year, both in setting of HTN - CHADS2Vasc score is 3 continue eliquis. She will have renal function tested today. Hypertension: Continue metoprolol 50 mg in the morning, 25 mg in the evening. Take HCTZ a little later in the day Obstructive sleep  apnea: continue CPAP Obesity First-degree AV block           Medication Adjustments/Labs and Tests Ordered: Current medicines are reviewed at length with the patient today.  Concerns regarding medicines are outlined above.  Orders Placed This Encounter  Procedures   EKG 12-Lead   No orders of the defined types were placed in this encounter.    Signed, Melida Quitter, MD  12/18/2021 9:54 AM    [place]

## 2021-12-18 ENCOUNTER — Encounter: Payer: Self-pay | Admitting: Cardiovascular Disease

## 2021-12-18 ENCOUNTER — Ambulatory Visit: Payer: Medicare Other

## 2021-12-18 ENCOUNTER — Ambulatory Visit: Payer: Medicare Other | Attending: Cardiovascular Disease | Admitting: Cardiovascular Disease

## 2021-12-18 VITALS — BP 104/60 | HR 77 | Ht 66.0 in | Wt 245.6 lb

## 2021-12-18 DIAGNOSIS — I484 Atypical atrial flutter: Secondary | ICD-10-CM | POA: Diagnosis not present

## 2021-12-18 DIAGNOSIS — I10 Essential (primary) hypertension: Secondary | ICD-10-CM

## 2021-12-18 DIAGNOSIS — I440 Atrioventricular block, first degree: Secondary | ICD-10-CM | POA: Insufficient documentation

## 2021-12-18 DIAGNOSIS — I4891 Unspecified atrial fibrillation: Secondary | ICD-10-CM | POA: Insufficient documentation

## 2021-12-18 NOTE — Patient Instructions (Signed)
Medication Instructions:  Your physician recommends that you continue on your current medications as directed. Please refer to the Current Medication list given to you today.  *If you need a refill on your cardiac medications before your next appointment, please call your pharmacy*   Follow-Up: At [facility], you and your health needs are our priority.  As part of our continuing mission to provide you with exceptional heart care, we have created designated Provider Care Teams.  These Care Teams include your primary Cardiologist (physician) and Advanced Practice Providers (APPs -  Physician Assistants and Nurse Practitioners) who all work together to provide you with the care you need, when you need it.  Your next appointment:   1 year(s)  The format for your next appointment:   In Person  Provider:   You may see Melida Quitter, MD or one of the following Advanced Practice Providers on your designated Care Team:   Tommye Standard, [place] Legrand Como "Jonni Sanger" Chalmers Cater, [place]    Important Information About Sugar

## 2021-12-19 LAB — BASIC METABOLIC PANEL
BUN/Creatinine Ratio: 18 (ref 12–28)
BUN: 14 mg/dL (ref 8–27)
CO2: 27 mmol/L (ref 20–29)
Calcium: 10 mg/dL (ref 8.7–10.3)
Chloride: 98 mmol/L (ref 96–106)
Creatinine, Ser: 0.79 mg/dL (ref 0.57–1.00)
Glucose: 90 mg/dL (ref 70–99)
Potassium: 3.6 mmol/L (ref 3.5–5.2)
Sodium: 139 mmol/L (ref 134–144)
eGFR: 82 mL/min/{1.73_m2} (ref >59)

## 2021-12-20 ENCOUNTER — Other Ambulatory Visit: Payer: Self-pay | Admitting: Physician Assistant

## 2021-12-20 DIAGNOSIS — E782 Mixed hyperlipidemia: Secondary | ICD-10-CM | POA: Diagnosis not present

## 2021-12-20 DIAGNOSIS — Z0000 Encounter for general adult medical examination without abnormal findings: Secondary | ICD-10-CM

## 2021-12-21 ENCOUNTER — Ambulatory Visit: Payer: Medicare Other | Admitting: Cardiovascular Disease

## 2021-12-25 ENCOUNTER — Encounter: Payer: Self-pay | Admitting: Interventional Cardiology

## 2021-12-26 ENCOUNTER — Telehealth: Payer: Self-pay | Admitting: Interventional Cardiology

## 2021-12-26 NOTE — Telephone Encounter (Signed)
Pt c/o BP issue: STAT if pt c/o blurred vision, one-sided weakness or slurred speech  1. What are your last 5 BP readings?   148/96 140/89 126/76 111/73 154/92  2. Are you having any other symptoms (ex. Dizziness, headache, blurred vision, passed out)?   Headache  3. What is your BP issue?   Patient stated she concerned her BP has been running high.  Patient wants advice on adjusting her medication.

## 2021-12-26 NOTE — Telephone Encounter (Signed)
Pt called [facility] T[facility] with concerns regarding her uncontrolled hypertension.     Pt is former Therapist, sports;  Pt states she takes HCTZ and Metoprolol in the mornings, and Losartan in the evenings around 6-7pm.    She is having difficulties 4 pm to 8 pm with HTN spikes.  She continues to get headaches when her HTN gets too high.   Pt saw Dr. Myles Gip on 12/18/2021, and was advised to take Hctz at noon.  She states has been doing this, but her BP remains the same.    Pt asked for appointment with a provider to discuss new care plan for her symptomatic hypertension.  Pt is scheduled to see E. Barbarann Ehlers, NP tomorrow morning to discuss HTN concerns / reassess POC.

## 2021-12-26 NOTE — Progress Notes (Addendum)
Office Visit    Patient Name: Catherine Callahan Date of Encounter: 12/27/2021  Primary Care Provider:  Carol Ada, MD Primary Cardiologist:  Sinclair Grooms, MD Primary Electrophysiologist: Melida Quitter, MD  Chief Complaint    Catherine Callahan is a 66 y.o. female with PMH of nonobstructive CAD s/p calcium score 41, PAF s/p AF ablation x2 (on Eliquis), HTN, HLD, OSA (on CPAP), first-degree AV block, obesity who presents today for complaint of elevated blood pressures.  Past Medical History    Past Medical History:  Diagnosis Date   Arthritis    Atrial flutter ([place]) 01/05/2013   CAD (coronary artery disease) 07/01/2019   Hypertension, essential, benign 01/11/2011   OSA on CPAP 01/05/2013   Overweight    Paroxysmal atrial fibrillation (HCC)    Ulcerative colitis ([place])    Past Surgical History:  Procedure Laterality Date   ATRIAL FIBRILLATION ABLATION N/A 06/30/2019   Procedure: ATRIAL FIBRILLATION ABLATION;  Surgeon: Thompson Grayer, MD;  Location: [place] [facility];  Service: Cardiovascular;  Laterality: N/A;   ATRIAL FIBRILLATION ABLATION N/A 01/17/2021   Procedure: ATRIAL FIBRILLATION ABLATION;  Surgeon: Thompson Grayer, MD;  Location: [place] [facility];  Service: Cardiovascular;  Laterality: N/A;   COLON SURGERY  2004   colon resection    Allergies  Allergies  Allergen Reactions   Penicillin G Anaphylaxis   Penicillins Anaphylaxis   Meloxicam Hives   Latex Rash and Hives    History of Present Illness    Catherine Callahan  is a 66 year old female with the above mention past medical history who presents today for complaint of elevated blood pressures.  She has been followed by Dr. Tamala Julian since 2012 for management of atrial fibrillation.  She was placed on flecainide therapy due to recurrent A-fib however remained symptomatic.  She was seen by Dr. Rayann Heman in 2014 for EP consultation.  She deferred AF ablation at that time and elected to continue  antiarrhythmic therapy.  2D echo was completed 2021 with EF of 60-65%, no RWMA, normal RV function with mildly elevated PA pressures and mildly dilated RA and moderately dilated LA.  She continued to have breakthrough atrial fibrillation on flecainide and ultimately elected to undergo AF ablation in 2021.  She reported occasional breakthrough episodes following ablation however well controlled with metoprolol use for as needed and palpitations.  She continued to have frequent episodes and underwent repeat AF ablation in 2022.  She was last seen by Dr. Rayann Heman in 04/2021 and was doing well post ablation.  She was last seen by Dr. Tamala Julian 07/2021 and was maintaining sinus rhythm off all antiarrhythmic therapy.  She is interested in possibility of discontinuing Eliquis at some point.  Blood pressure at that time was well controlled.  No med changes were made.  She was last seen by Dr. Myles Gip on 12/2021 and had no acute complaints at that time.  She endorsed 2 brief episodes of atrial fibrillation over the past year.  She was advised to take her HCTZ later in the afternoon for better BP control.  She contacted our triage line yesterday with complaint of elevated blood pressures and headaches.  She also noted a number of nosebleeds.  Catherine Callahan presents today for complaint of elevated blood pressures alone.  Since last being seen in the office patient reports that she has been noticing increased blood pressures with headaches over the past 2 weeks.  Patient's blood pressure today was well controlled  at 126/88 with heart rate of 85 bpm.  She denies any adverse reactions with her current medication regimen and is compliant.  She is euvolemic on examination and denies any indiscretions with excess salt.  During our visit we discussed the pathophysiology of hypertension and all questions were answered to patient's satisfaction.  Patient denies chest pain, palpitations, dyspnea, PND, orthopnea, nausea, vomiting, dizziness,  syncope, edema, weight gain, or early satiety.   Home Medications    Current Outpatient Medications  Medication Sig Dispense Refill   acetaminophen (TYLENOL) 500 MG tablet Take 1,000 mg by mouth every 6 (six) hours as needed for moderate pain.     acyclovir (ZOVIRAX) 200 MG capsule Take 200 mg by mouth as needed.     ELIQUIS 5 MG TABS tablet TAKE 1 TABLET BY MOUTH TWICE A DAY 180 tablet 2   ezetimibe (ZETIA) 10 MG tablet TAKE 1 TABLET BY MOUTH EVERY DAY 90 tablet 3   FIBER PO Take 4 capsules by mouth at bedtime.     hydrochlorothiazide (HYDRODIURIL) 25 MG tablet Take 1 tablet (25 mg total) by mouth daily. 90 tablet 3   losartan (COZAAR) 100 MG tablet TAKE 1 TABLET BY MOUTH EVERY DAY 90 tablet 3   Mesalamine 800 MG TBEC Take 800 mg by mouth daily.      metoprolol succinate (TOPROL-XL) 50 MG 24 hr tablet Take 1 tablet (50 mg total) by mouth daily. Take with or immediately following a meal. 90 tablet 3   Multiple Vitamin (MULTIVITAMIN WITH MINERALS) TABS tablet Take 1 tablet by mouth daily. Centrum Silver     rosuvastatin (CRESTOR) 40 MG tablet TAKE 1 TABLET BY MOUTH EVERY DAY 90 tablet 3   No current facility-administered medications for this visit.     Review of Systems  Please see the history of present illness.    (+) Headache (+) Increased anxiety  All other systems reviewed and are otherwise negative except as noted above.  Physical Exam    Wt Readings from Last 3 Encounters:  12/27/21 246 lb (111.6 kg)  12/18/21 245 lb 9.6 oz (111.4 kg)  07/26/21 241 lb 6.4 oz (109.5 kg)   VS: Vitals:   12/27/21 0823  BP: 126/88  Pulse: 85  SpO2: 99%  ,Body mass index is 39.71 kg/m.  Constitutional:      Appearance: Healthy appearance. Not in distress.  Neck:     Vascular: JVD normal.  Pulmonary:     Effort: Pulmonary effort is normal.     Breath sounds: No wheezing. No rales. Diminished in the bases Cardiovascular:     Normal rate. Regular rhythm. Normal S1. Normal S2.       Murmurs: There is no murmur.  Edema:    Peripheral edema absent.  Abdominal:     Palpations: Abdomen is soft non tender. There is no hepatomegaly.  Skin:    General: Skin is warm and dry.  Neurological:     General: No focal deficit present.     Mental Status: Alert and oriented to person, place and time.     Cranial Nerves: Cranial nerves are intact.  EKG/LABS/Other Studies Reviewed    ECG personally reviewed by me today -none completed today  CHA2DS2-VASc Score = 3 [CHF History: 0, HTN History: 1, Diabetes History: 0, Stroke History: 0, Vascular Disease History: 0, Age Score: 1, Gender Score: 1].  Therefore, the patient's annual risk of stroke is 3.2 %.      Lab Results  Component Value Date  WBC 5.6 01/02/2021   HGB 15.0 01/02/2021   HCT 44.4 01/02/2021   MCV 100 (H) 01/02/2021   PLT 164 01/02/2021   Lab Results  Component Value Date   CREATININE 0.79 12/18/2021   BUN 14 12/18/2021   NA 139 12/18/2021   K 3.6 12/18/2021   CL 98 12/18/2021   CO2 27 12/18/2021   Lab Results  Component Value Date   ALT 25 09/29/2020   AST 20 09/29/2020   ALKPHOS 72 09/29/2020   BILITOT 0.6 09/29/2020   Lab Results  Component Value Date   CHOL 136 09/29/2020   HDL 59 09/29/2020   LDLCALC 62 09/29/2020   TRIG 77 09/29/2020   CHOLHDL 2.3 09/29/2020    Lab Results  Component Value Date   HGBA1C 5.2 05/19/2020    Assessment & Plan    1.  Essential hypertension: -Patient contacted triage line yesterday with complaint of elevated BP with headaches and nosebleeds. -Patient's blood pressure today was well controlled at 126/88 -We will increase patient's HCTZ to 25 mg daily -Check BMET in two weeks -Continue losartan potassium 100 mg daily, metoprolol succinate 50 mg daily -Patient will document blood pressures over the following week and call back with results. -We will plan to change losartan to valsartan if blood pressures remain elevated.  2.  Paroxysmal atrial  fibrillation: -s/p AF ablation x2 with last 2022 with no sustained reoccurrence.  She is currently not on any antiarrhythmic medications -She continues rate control with Toprol XL 50 mg -She denies any recurrence recently and states that she had 2 episodes that occurred since her ablation that were nonsustained  3.  Obstructive sleep apnea: -Patient endorses compliance with CPAP therapy  4.  Secondary hypercoagulable state -CHA2DS2-VASc Score = 3 [CHF History: 0, HTN History: 1, Diabetes History: 0, Stroke History: 0, Vascular Disease History: 0, Age Score: 1, Gender Score: 1].  Therefore, the patient's annual risk of stroke is 3.2 %.     -Continue Eliquis 5 mg twice daily -Dose appropriate based on patient's age and weight  Disposition: Follow-up with Belva Crome III, MD or APP in 2 weeks   Medication Adjustments/Labs and Tests Ordered: Current medicines are reviewed at length with the patient today.  Concerns regarding medicines are outlined above.   Signed, Mable Fill, Marissa Nestle, NP 12/27/2021, 11:42 AM [facility]  Note:  This document was prepared using Dragon voice recognition software and may include unintentional dictation errors.

## 2021-12-27 ENCOUNTER — Encounter: Payer: Self-pay | Admitting: Nurse Practitioner

## 2021-12-27 ENCOUNTER — Ambulatory Visit: Payer: Medicare Other | Attending: Nurse Practitioner | Admitting: Nurse Practitioner

## 2021-12-27 VITALS — BP 126/88 | HR 85 | Ht 66.0 in | Wt 246.0 lb

## 2021-12-27 DIAGNOSIS — I10 Essential (primary) hypertension: Secondary | ICD-10-CM | POA: Diagnosis not present

## 2021-12-27 DIAGNOSIS — G4733 Obstructive sleep apnea (adult) (pediatric): Secondary | ICD-10-CM | POA: Diagnosis not present

## 2021-12-27 DIAGNOSIS — D6869 Other thrombophilia: Secondary | ICD-10-CM | POA: Insufficient documentation

## 2021-12-27 DIAGNOSIS — I4891 Unspecified atrial fibrillation: Secondary | ICD-10-CM | POA: Diagnosis not present

## 2021-12-27 MED ORDER — HYDROCHLOROTHIAZIDE 25 MG PO TABS: 25.0000 mg | ORAL_TABLET | Freq: Every day | ORAL | 3 refills | Status: DC

## 2021-12-27 NOTE — Patient Instructions (Signed)
Medication Instructions:  Your physician has recommended you make the following change in your medication:   Increase HCTZ to 72m daily   *If you need a refill on your cardiac medications before your next appointment, please call your pharmacy*   Lab Work: BMET 2 weeks If you have labs (blood work) drawn today and your tests are completely normal, you will receive your results only by: [address] [place](if you have MyChart) OR A paper copy in the mail If you have any lab test that is abnormal or we need to change your treatment, we will call you to review the results.  Follow-Up: At [facility] you and your health needs are our priority.  As part of our continuing mission to provide you with exceptional heart care, we have created designated Provider Care Teams.  These Care Teams include your primary Cardiologist (physician) and Advanced Practice Providers (APPs -  Physician Assistants and Nurse Practitioners) who all work together to provide you with the care you need, when you need it.  We recommend signing up for the patient portal called "MyChart".  Sign up information is provided on this After Visit Summary.  MyChart is used to connect with patients for Virtual Visits (Telemedicine).  Patients are able to view lab/test results, encounter notes, upcoming appointments, etc.  Non-urgent messages can be sent to your provider as well.   To learn more about what you can do with MyChart, go to [email or web address]    Your next appointment:   2 week(s)  The format for your next appointment:   In Person  Provider:   EAmbrose Pancoast NP

## 2021-12-29 ENCOUNTER — Ambulatory Visit
Admission: RE | Admit: 2021-12-29 | Discharge: 2021-12-29 | Disposition: A | Discharge status: Home / Self Care | Payer: Medicare Other | Source: Ambulatory Visit | Attending: Physician Assistant | Admitting: Physician Assistant

## 2021-12-29 ENCOUNTER — Ambulatory Visit (INDEPENDENT_AMBULATORY_CARE_PROVIDER_SITE_OTHER): Payer: Medicare Other | Admitting: Podiatry

## 2021-12-29 ENCOUNTER — Ambulatory Visit (INDEPENDENT_AMBULATORY_CARE_PROVIDER_SITE_OTHER): Payer: Medicare Other

## 2021-12-29 DIAGNOSIS — M2012 Hallux valgus (acquired), left foot: Secondary | ICD-10-CM | POA: Diagnosis not present

## 2021-12-29 DIAGNOSIS — M2042 Other hammer toe(s) (acquired), left foot: Secondary | ICD-10-CM

## 2021-12-29 DIAGNOSIS — Z0000 Encounter for general adult medical examination without abnormal findings: Secondary | ICD-10-CM

## 2021-12-29 NOTE — Progress Notes (Unsigned)
Subjective:  Patient ID: Catherine Callahan, female    DOB: 1956-01-08,  MRN: [id number]  Chief Complaint  Patient presents with   Bunions    Left foot bunion/hammer toes pt stated that she has had them for a while but its starting to cause discomfort    66 y.o. female presents with the above complaint.  Patient presents with left first metatarsophalangeal joint pain with bunion.  Patient also has a second digit hammertoe contracture that has been rubbing against both of them.  Patient has some crossover deformity.  She wanted to get it evaluated discuss treatment options.  She states that hurts with ambulation worse with pressure pain scale 6 out of 10.  Hurts with certain type of shoes she is on blood thinners.  She would like to discuss treatment options.  Including shoe gear modifications   Review of Systems: Negative except as noted in the HPI. Denies N/V/F/Ch.  Past Medical History:  Diagnosis Date   Arthritis    Atrial flutter ([place]) 01/05/2013   CAD (coronary artery disease) 07/01/2019   Hypertension, essential, benign 01/11/2011   OSA on CPAP 01/05/2013   Overweight    Paroxysmal atrial fibrillation (HCC)    Ulcerative colitis ([place])     Current Outpatient Medications:    acetaminophen (TYLENOL) 500 MG tablet, Take 1,000 mg by mouth every 6 (six) hours as needed for moderate pain., Disp: , Rfl:    acyclovir (ZOVIRAX) 200 MG capsule, Take 200 mg by mouth as needed., Disp: , Rfl:    ELIQUIS 5 MG TABS tablet, TAKE 1 TABLET BY MOUTH TWICE A DAY, Disp: 180 tablet, Rfl: 2   ezetimibe (ZETIA) 10 MG tablet, TAKE 1 TABLET BY MOUTH EVERY DAY, Disp: 90 tablet, Rfl: 3   FIBER PO, Take 4 capsules by mouth at bedtime., Disp: , Rfl:    hydrochlorothiazide (HYDRODIURIL) 25 MG tablet, Take 1 tablet (25 mg total) by mouth daily., Disp: 90 tablet, Rfl: 3   losartan (COZAAR) 100 MG tablet, TAKE 1 TABLET BY MOUTH EVERY DAY, Disp: 90 tablet, Rfl: 3   Mesalamine 800 MG TBEC, Take 800 mg by mouth  daily. , Disp: , Rfl:    metoprolol succinate (TOPROL-XL) 50 MG 24 hr tablet, Take 1 tablet (50 mg total) by mouth daily. Take with or immediately following a meal., Disp: 90 tablet, Rfl: 3   Multiple Vitamin (MULTIVITAMIN WITH MINERALS) TABS tablet, Take 1 tablet by mouth daily. Centrum Silver, Disp: , Rfl:    rosuvastatin (CRESTOR) 40 MG tablet, TAKE 1 TABLET BY MOUTH EVERY DAY, Disp: 90 tablet, Rfl: 3  Social History   Tobacco Use  Smoking Status Never   Passive exposure: Never  Smokeless Tobacco Never    Allergies  Allergen Reactions   Penicillin G Anaphylaxis   Penicillins Anaphylaxis   Meloxicam Hives   Latex Rash and Hives   Objective:  There were no vitals filed for this visit. There is no height or weight on file to calculate BMI. Constitutional Well developed. Well nourished.  Vascular Dorsalis pedis pulses palpable bilaterally. Posterior tibial pulses palpable bilaterally. Capillary refill normal to all digits.  No cyanosis or clubbing noted. Pedal hair growth normal.  Neurologic Normal speech. Oriented to person, place, and time. Epicritic sensation to light touch grossly present bilaterally.  Dermatologic Nails well groomed and normal in appearance. No open wounds. No skin lesions.  Orthopedic: Pain on palpation of the left first metatarsal   Radiographs: Phalangeal joint pain.  Osteoarthritic changes noted  to the first MPJ with bunion deformity and increasing intermetatarsal angle.  There is a hammertoe contracture of second digit noted as well with some crossover deformity noted.  No other bony abnormalities identified.  Some midfoot arthritis noted.  As well as ankle and subtalar joint arthritis Assessment:   1. Acquired hallux valgus of left foot   2. Hammertoe of left foot    Plan:  Patient was evaluated and treated and all questions answered.  Left first metatarsophalangeal joint bunion pain with underlying osteoarthritis and second digit hammertoe  contracture -All questions and concerns were discussed with the patient in extensive detail I extensively discussed shoe gear modification padding protecting offloading.  She will plan on doing that and work through it.  I also discussed briefly surgical options as well including first metatarsophalangeal joint fusion with the second digit hammertoe contracture.  She states understanding will think about the procedure and get back to me.  No follow-ups on file.   Left first MTP bunion pain with underlying arthritis and second digit hammertoe.  Think about options spacers and toe coverings were dispensed

## 2022-01-02 ENCOUNTER — Telehealth: Payer: Self-pay | Admitting: *Deleted

## 2022-01-02 NOTE — Telephone Encounter (Signed)
   Pre-operative Risk Assessment    Patient Name: Catherine Callahan  DOB: 1956/02/17 MRN: [id number]      Request for Surgical Clearance    Procedure:   COLONOSCOPY : ULCERATIVE COLITIS   Date of Surgery:  Clearance 01/30/22                                 Surgeon: DR. Alessandra Bevels Surgeon's Group or Practice Name:  [facility] Phone number:  [phone] Fax number:  [phone]   Type of Clearance Requested:   - Medical  - Pharmacy:  Hold Apixaban (Eliquis)     Type of Anesthesia:   PROPOFOL   Additional requests/questions:    Jiles Prows   01/02/2022, 5:09 PM

## 2022-01-03 ENCOUNTER — Telehealth: Payer: Self-pay

## 2022-01-03 DIAGNOSIS — I10 Essential (primary) hypertension: Secondary | ICD-10-CM

## 2022-01-03 NOTE — Telephone Encounter (Signed)
Spoke with [name] who states he wants the patient to have a BMET this week. Since he changed her blood pressure medication.   Contacted the patient and made her aware. She states she will come in tomorrow. Order placed.

## 2022-01-03 NOTE — Telephone Encounter (Signed)
Patient with diagnosis of atrial fibrillation on Eliquis for anticoagulation.    Procedure:   COLONOSCOPY : ULCERATIVE COLITIS   Date of Surgery:  Clearance 01/30/22        CHA2DS2-VASc Score = 4   This indicates a 4.8% annual risk of stroke. The patient's score is based upon: CHF History: 0 HTN History: 1 Diabetes History: 0 Stroke History: 0 Vascular Disease History: 1 Age Score: 1 Gender Score: 1   CrCl 88 Platelet count 164  Per office protocol, patient can hold Eliquis for 2 days prior to procedure.   Patient will not need bridging with Lovenox (enoxaparin) around procedure.  **This guidance is not considered finalized until pre-operative APP has relayed final recommendations.**

## 2022-01-04 ENCOUNTER — Other Ambulatory Visit: Payer: Medicare Other

## 2022-01-08 NOTE — Progress Notes (Unsigned)
Office Visit    Patient Name: Catherine Callahan Date of Encounter: 01/08/2022  Primary Care Provider:  Carol Ada, MD Primary Cardiologist:  Sinclair Grooms, MD Primary Electrophysiologist: Melida Quitter, MD  Chief Complaint    Catherine Callahan is a 66 y.o. female with PMH of nonobstructive CAD s/p calcium score 59, PAF s/p AF ablation x2 (on Eliquis), HTN, HLD, OSA (on CPAP), first-degree AV block, obesity who presents today for follow-up of blood pressure and preoperative clearance.  Past Medical History    Past Medical History:  Diagnosis Date   Arthritis    Atrial flutter ([place]) 01/05/2013   CAD (coronary artery disease) 07/01/2019   Hypertension, essential, benign 01/11/2011   OSA on CPAP 01/05/2013   Overweight    Paroxysmal atrial fibrillation (HCC)    Ulcerative colitis ([place])    Past Surgical History:  Procedure Laterality Date   ATRIAL FIBRILLATION ABLATION N/A 06/30/2019   Procedure: ATRIAL FIBRILLATION ABLATION;  Surgeon: Thompson Grayer, MD;  Location: [facility];  Service: Cardiovascular;  Laterality: N/A;   ATRIAL FIBRILLATION ABLATION N/A 01/17/2021   Procedure: ATRIAL FIBRILLATION ABLATION;  Surgeon: Thompson Grayer, MD;  Location: [facility];  Service: Cardiovascular;  Laterality: N/A;   COLON SURGERY  2004   colon resection    Allergies  Allergies  Allergen Reactions   Penicillin G Anaphylaxis   Penicillins Anaphylaxis   Meloxicam Hives   Latex Rash and Hives    History of Present Illness    Catherine Callahan  is a 66 year old female with the above mention past medical history who presents today for complaint of elevated blood pressures.  She has been followed by Dr. Tamala Julian since 2012 for management of atrial fibrillation.  She was placed on flecainide therapy due to recurrent A-fib however remained symptomatic.  She was seen by Dr. Rayann Heman in 2014 for EP consultation.  She deferred AF ablation at that time and elected to  continue antiarrhythmic therapy.  2D echo was completed 2021 with EF of 60-65%, no RWMA, normal RV function with mildly elevated PA pressures and mildly dilated RA and moderately dilated LA.  She continued to have breakthrough atrial fibrillation on flecainide and ultimately elected to undergo AF ablation in 2021.  She reported occasional breakthrough episodes following ablation however well controlled with metoprolol use for as needed and palpitations.  She continued to have frequent episodes and underwent repeat AF ablation in 2022.  She was last seen by Dr. Rayann Heman in 04/2021 and was doing well post ablation.  She was last seen by Dr. Tamala Julian 07/2021 and was maintaining sinus rhythm off all antiarrhythmic therapy.  She is interested in possibility of discontinuing Eliquis at some point.  Blood pressure at that time was well controlled.  No med changes were made.    She was last seen by Dr. Myles Gip on 12/2021 and had no acute complaints at that time.  She endorsed 2 brief episodes of atrial fibrillation over the past year.  She was advised to take her HCTZ later in the afternoon for better BP control.  She contacted our triage line yesterday with complaint of elevated blood pressures and headaches.  She also noted a number of nosebleeds.  She was seen by me in follow-up on 12/27/2021 with complaint of increased headaches and nosebleeds.  Patient's blood pressure was well controlled at 126/88 with heart rate of 85 bpm.  She did endorse elevated blood pressures at home  and patient's HCTZ was increased to 25 mg daily with instruction to monitor blood pressures and contact office for elevated readings.   [name] presents today alone for follow-up.  Since last being seen in the office patient reports that she has been doing much better and blood pressures have been stable since previous visit.  She was intolerant to the increase in HCTZ and therefore only took her normal dose of 12.5 mg daily.  Patient experienced  some orthostatic hypotension and low blood pressures at home in the 09N systolically.  Her blood pressure today was much improved at 98/58.  She is tolerant to her other medications and denies any adverse reactions.  She was also granted surgical clearance for upcoming colonoscopy.  Patient denies chest pain, palpitations, dyspnea, PND, orthopnea, nausea, vomiting, dizziness, syncope, edema, weight gain, or early satiety.  Home Medications    Current Outpatient Medications  Medication Sig Dispense Refill   acetaminophen (TYLENOL) 500 MG tablet Take 1,000 mg by mouth every 6 (six) hours as needed for moderate pain.     acyclovir (ZOVIRAX) 200 MG capsule Take 200 mg by mouth as needed.     ELIQUIS 5 MG TABS tablet TAKE 1 TABLET BY MOUTH TWICE A DAY 180 tablet 2   ezetimibe (ZETIA) 10 MG tablet TAKE 1 TABLET BY MOUTH EVERY DAY 90 tablet 3   FIBER PO Take 4 capsules by mouth at bedtime.     hydrochlorothiazide (HYDRODIURIL) 25 MG tablet Take 1 tablet (25 mg total) by mouth daily. 90 tablet 3   losartan (COZAAR) 100 MG tablet TAKE 1 TABLET BY MOUTH EVERY DAY 90 tablet 3   Mesalamine 800 MG TBEC Take 800 mg by mouth daily.      metoprolol succinate (TOPROL-XL) 50 MG 24 hr tablet Take 1 tablet (50 mg total) by mouth daily. Take with or immediately following a meal. 90 tablet 3   Multiple Vitamin (MULTIVITAMIN WITH MINERALS) TABS tablet Take 1 tablet by mouth daily. Centrum Silver     rosuvastatin (CRESTOR) 40 MG tablet TAKE 1 TABLET BY MOUTH EVERY DAY 90 tablet 3   No current facility-administered medications for this visit.     Review of Systems  Please see the history of present illness.      All other systems reviewed and are otherwise negative except as noted above.  Physical Exam    Wt Readings from Last 3 Encounters:  12/27/21 246 lb (111.6 kg)  12/18/21 245 lb 9.6 oz (111.4 kg)  07/26/21 241 lb 6.4 oz (109.5 kg)   AT:FTDDU were no vitals filed for this visit.,There is no height or  weight on file to calculate BMI.  Constitutional:      Appearance: Healthy appearance. Not in distress.  Neck:     Vascular: JVD normal.  Pulmonary:     Effort: Pulmonary effort is normal.     Breath sounds: No wheezing. No rales. Diminished in the bases Cardiovascular:     Normal rate. Regular rhythm. Normal S1. Normal S2.      Murmurs: There is no murmur.  Edema:    Peripheral edema absent.  Abdominal:     Palpations: Abdomen is soft non tender. There is no hepatomegaly.  Skin:    General: Skin is warm and dry.  Neurological:     General: No focal deficit present.     Mental Status: Alert and oriented to person, place and time.     Cranial Nerves: Cranial nerves are intact.  EKG/LABS/Other  Studies Reviewed    ECG personally reviewed by me today -none completed today  Risk Assessment/Calculations:    CHA2DS2-VASc Score = 4   This indicates a 4.8% annual risk of stroke. The patient's score is based upon: CHF History: 0 HTN History: 1 Diabetes History: 0 Stroke History: 0 Vascular Disease History: 1 Age Score: 1 Gender Score: 1           Lab Results  Component Value Date   WBC 5.6 01/02/2021   HGB 15.0 01/02/2021   HCT 44.4 01/02/2021   MCV 100 (H) 01/02/2021   PLT 164 01/02/2021   Lab Results  Component Value Date   CREATININE 0.79 12/18/2021   BUN 14 12/18/2021   NA 139 12/18/2021   K 3.6 12/18/2021   CL 98 12/18/2021   CO2 27 12/18/2021   Lab Results  Component Value Date   ALT 25 09/29/2020   AST 20 09/29/2020   ALKPHOS 72 09/29/2020   BILITOT 0.6 09/29/2020   Lab Results  Component Value Date   CHOL 136 09/29/2020   HDL 59 09/29/2020   LDLCALC 62 09/29/2020   TRIG 77 09/29/2020   CHOLHDL 2.3 09/29/2020    Lab Results  Component Value Date   HGBA1C 5.2 05/19/2020    Assessment & Plan    Preoperative clearance: The patient affirms she has been doing well without any new cardiac symptoms. They are able to achieve 5 METS without  cardiac limitations. Therefore, based on ACC/AHA guidelines, the patient would be at acceptable risk for the planned procedure without further cardiovascular testing. The patient was advised that if she develops new symptoms prior to surgery to contact our office to arrange for a follow-up visit, and she verbalized understanding.   Ms. Randle perioperative risk of a major cardiac event is 0.4% according to the Revised Cardiac Risk Index (RCRI).  Therefore, she is at low risk for perioperative complications.   Her functional capacity is good at 5.62 METs according to the Duke Activity Status Index (DASI). Recommendations: According to ACC/AHA guidelines, no further cardiovascular testing needed.  The patient may proceed to surgery at acceptable risk.   Antiplatelet and/or Anticoagulation Recommendations:  Eliquis (Apixaban) can be held for 2 days prior to surgery.  Please resume post op when felt to be safe.    2.  Essential hypertension: -Patient's blood pressure today was well controlled at 98/58 -Continue  losartan 100 mg, and metoprolol succinate 50 mg daily -We will discontinue HCTZ 12.5 due to labile blood pressures with increased dose. -She was encouraged to continue to monitor blood pressures at home.  3.  Paroxysmal atrial fibrillation: -Today patient reports no palpitations or skipped beats. -Continue Toprol-XL 50 mg daily -Continue Eliquis 5 mg twice daily -CHA2DS2-VASc Score = 4 [CHF History: 0, HTN History: 1, Diabetes History: 0, Stroke History: 0, Vascular Disease History: 1, Age Score: 1, Gender Score: 1].  Therefore, the patient's annual risk of stroke is 4.8 %.      4.  Secondary hypercoagulable state: -Patient currently on Eliquis 5 mg as noted above        Disposition: Follow-up with Belva Crome III, MD or APP in 6 months     Medication Adjustments/Labs and Tests Ordered: Current medicines are reviewed at length with the patient today.  Concerns regarding  medicines are outlined above.   Signed, Mable Fill, Marissa Nestle, NP 01/08/2022, 6:12 AM [facility]  Note:  This document was prepared using  [employer] forces training and education officer and may include unintentional dictation errors.

## 2022-01-10 ENCOUNTER — Ambulatory Visit: Payer: Medicare Other

## 2022-01-10 ENCOUNTER — Encounter: Payer: Self-pay | Admitting: Nurse Practitioner

## 2022-01-10 ENCOUNTER — Ambulatory Visit: Payer: Medicare Other | Attending: Nurse Practitioner | Admitting: Nurse Practitioner

## 2022-01-10 VITALS — BP 98/58 | HR 71 | Ht 66.0 in | Wt 247.0 lb

## 2022-01-10 DIAGNOSIS — I4891 Unspecified atrial fibrillation: Secondary | ICD-10-CM | POA: Diagnosis not present

## 2022-01-10 DIAGNOSIS — I10 Essential (primary) hypertension: Secondary | ICD-10-CM

## 2022-01-10 DIAGNOSIS — D6869 Other thrombophilia: Secondary | ICD-10-CM | POA: Insufficient documentation

## 2022-01-10 DIAGNOSIS — Z01810 Encounter for preprocedural cardiovascular examination: Secondary | ICD-10-CM | POA: Diagnosis not present

## 2022-01-10 LAB — BASIC METABOLIC PANEL
BUN/Creatinine Ratio: 16 (ref 12–28)
BUN: 14 mg/dL (ref 8–27)
CO2: 28 mmol/L (ref 20–29)
Calcium: 10.1 mg/dL (ref 8.7–10.3)
Chloride: 102 mmol/L (ref 96–106)
Creatinine, Ser: 0.88 mg/dL (ref 0.57–1.00)
Glucose: 98 mg/dL (ref 70–99)
Potassium: 4.7 mmol/L (ref 3.5–5.2)
Sodium: 143 mmol/L (ref 134–144)
eGFR: 72 mL/min/{1.73_m2} (ref >59)

## 2022-01-10 NOTE — Patient Instructions (Addendum)
Medication Instructions:  DISCONTINUE Hydrochlorothiazide *If you need a refill on your cardiac medications before your next appointment, please call your pharmacy*   Lab Work: None Ordered   Testing/Procedures: None Ordered   Follow-Up: At [facility], you and your health needs are our priority.  As part of our continuing mission to provide you with exceptional heart care, we have created designated Provider Care Teams.  These Care Teams include your primary Cardiologist (physician) and Advanced Practice Providers (APPs -  Physician Assistants and Nurse Practitioners) who all work together to provide you with the care you need, when you need it.  We recommend signing up for the patient portal called "MyChart".  Sign up information is provided on this After Visit Summary.  MyChart is used to connect with patients for Virtual Visits (Telemedicine).  Patients are able to view lab/test results, encounter notes, upcoming appointments, etc.  Non-urgent messages can be sent to your provider as well.   To learn more about what you can do with MyChart, go to [email or web address].    Your next appointment:   6 month(s)  The format for your next appointment:   In Person  Provider:   Ambrose Pancoast, NP        Other Instructions Check your blood pressure daily   Important Information About Sugar

## 2022-01-22 ENCOUNTER — Telehealth: Payer: Self-pay

## 2022-01-22 ENCOUNTER — Other Ambulatory Visit: Payer: Self-pay

## 2022-01-22 MED ORDER — VALSARTAN 160 MG PO TABS: 160.0000 mg | ORAL_TABLET | Freq: Every day | ORAL | 1 refills | Status: DC

## 2022-01-22 MED ORDER — LOSARTAN POTASSIUM 100 MG PO TABS: 100.0000 mg | ORAL_TABLET | Freq: Every day | ORAL | 1 refills | Status: DC

## 2022-01-22 MED ORDER — HYDROCHLOROTHIAZIDE 12.5 MG PO CAPS: 12.5000 mg | ORAL_CAPSULE | Freq: Every day | ORAL | 1 refills | Status: DC

## 2022-01-22 NOTE — Telephone Encounter (Signed)
Spoke with [name] who states the patient can go back on Losartan 153m once a day.  Contacted the pharmacy and requested they discontinue Valsartan and we will be sending a new rx over to restart Losartan. He voiced understanding.   Rx(s) sent to pharmacy electronically.

## 2022-02-14 ENCOUNTER — Other Ambulatory Visit: Payer: Self-pay | Admitting: Nurse Practitioner

## 2022-02-22 ENCOUNTER — Other Ambulatory Visit: Payer: Self-pay | Admitting: Internal Medicine

## 2022-02-22 DIAGNOSIS — I4891 Unspecified atrial fibrillation: Secondary | ICD-10-CM

## 2022-02-23 NOTE — Telephone Encounter (Signed)
Prescription refill request for Eliquis received. Indication:afib Last office visit:11/23 Scr:0.8 Age: 66 Weight:112 kg  Prescription refilled

## 2022-03-19 ENCOUNTER — Other Ambulatory Visit: Payer: Self-pay

## 2022-03-19 DIAGNOSIS — Z87891 Personal history of nicotine dependence: Secondary | ICD-10-CM

## 2022-03-19 DIAGNOSIS — Z122 Encounter for screening for malignant neoplasm of respiratory organs: Secondary | ICD-10-CM

## 2022-04-25 ENCOUNTER — Ambulatory Visit (INDEPENDENT_AMBULATORY_CARE_PROVIDER_SITE_OTHER): Payer: Medicare Other | Admitting: Acute Care

## 2022-04-25 ENCOUNTER — Encounter: Payer: Self-pay | Admitting: Acute Care

## 2022-04-25 DIAGNOSIS — Z87891 Personal history of nicotine dependence: Secondary | ICD-10-CM | POA: Diagnosis not present

## 2022-04-25 NOTE — Patient Instructions (Signed)
Thank you for participating in the [place] [facility] Scre[facility] Program. It was our pleasure to meet you today. We will call you with the results of your scan within the next few days. Your scan will be assigned a Lung RADS category score by the physicians reading the scans.  This Lung RADS score determines follow up scanning.  See below for description of categories, and follow up screening recommendations. We will be in touch to schedule your follow up screening annually or based on recommendations of our providers. We will fax a copy of your scan results to your Primary Care Physician, or the physician who referred you to the program, to ensure they have the results. Please call the office if you have any questions or concerns regarding your scanning experience or results.  Our office number is [phone]. Please speak with Denise Phelps, RN. , or  Denise Buckner RN, They are  our Lung Cancer Screening RN.'s If They are unavailable when you call, Please leave a message on the voice mail. We will return your call at our earliest convenience.This voice mail is monitored several times a day.  Remember, if your scan is normal, we will scan you annually as long as you continue to meet the criteria for the program. (Age 50-80, Current smoker or smoker who has quit within the last 15 years). If you are a smoker, remember, quitting is the single most powerful action that you can take to decrease your risk of lung cancer and other pulmonary, breathing related problems. We know quitting is hard, and we are here to help.  Please let us know if there is anything we can do to help you meet your goal of quitting. If you are a former smoker, congratulations. We are proud of you! Remain smoke free! Remember you can refer friends or family members through the number above.  We will screen them to make sure they meet criteria for the program. Thank you for helping us take better care of you by  participating in Lung Screening.  You can receive free nicotine replacement therapy ( patches, gum or mints) by calling 1-800-QUIT NOW. Please call so we can get you on the path to becoming  a non-smoker. I know it is hard, but you can do this!  Lung RADS Categories:  Lung RADS 1: no nodules or definitely non-concerning nodules.  Recommendation is for a repeat annual scan in 12 months.  Lung RADS 2:  nodules that are non-concerning in appearance and behavior with a very low likelihood of becoming an active cancer. Recommendation is for a repeat annual scan in 12 months.  Lung RADS 3: nodules that are probably non-concerning , includes nodules with a low likelihood of becoming an active cancer.  Recommendation is for a 6-month repeat screening scan. Often noted after an upper respiratory illness. We will be in touch to make sure you have no questions, and to schedule your 6-month scan.  Lung RADS 4 A: nodules with concerning findings, recommendation is most often for a follow up scan in 3 months or additional testing based on our provider's assessment of the scan. We will be in touch to make sure you have no questions and to schedule the recommended 3 month follow up scan.  Lung RADS 4 B:  indicates findings that are concerning. We will be in touch with you to schedule additional diagnostic testing based on our provider's  assessment of the scan.  Other options for assistance in smoking cessation (   As covered by your insurance benefits)  Hypnosis for smoking cessation  [employer] [phone]  Acupuncture for smoking cessation  [facility] [phone]   

## 2022-04-25 NOTE — Progress Notes (Signed)
Virtual Visit via Telephone Note  I connected with Freddie Apley on 04/25/22 at  9:30 AM EST by telephone and verified that I am speaking with the correct person using two identifiers.  Location: Patient:  At home Provider: [place], [place], [place], [address]    I discussed the limitations, risks, security and privacy concerns of performing an evaluation and management service by telephone and the availability of in person appointments. I also discussed with the patient that there may be a patient responsible charge related to this service. The patient expressed understanding and agreed to proceed.   Shared Decision Making Visit Lung Cancer Screening Program [id number])   Eligibility: Age 67 y.o. Pack Years Smoking History Calculation 28 pack year smoking history (# packs/per year x # years smoked) Recent History of coughing up blood  no Unexplained weight loss? no ( >Than 15 pounds within the last 6 months ) Prior History Lung / other cancer no (Diagnosis within the last 5 years already requiring surveillance chest CT Scans). Smoking Status Former Smoker Former Smokers: Years since quit: 13 years  Quit Date: 2012  Visit Components: Discussion included one or more decision making aids. yes Discussion included risk/benefits of screening. yes Discussion included potential follow up diagnostic testing for abnormal scans. yes Discussion included meaning and risk of over diagnosis. yes Discussion included meaning and risk of False Positives. yes Discussion included meaning of total radiation exposure. yes  Counseling Included: Importance of adherence to annual lung cancer LDCT screening. yes Impact of comorbidities on ability to participate in the program. yes Ability and willingness to under diagnostic treatment. yes  Smoking Cessation Counseling: Current Smokers:  Discussed importance of smoking cessation. yes Information about tobacco cessation classes and  interventions provided to patient. yes Patient provided with "ticket" for LDCT Scan. yes Symptomatic Patient. no  Counseling NA Diagnosis Code: Tobacco Use Z72.0 Asymptomatic Patient yes  Counseling (Intermediate counseling: > three minutes counseling) [id number] Former Smokers:  Discussed the importance of maintaining cigarette abstinence. yes Diagnosis Code: Personal History of Nicotine Dependence. [id number] Information about tobacco cessation classes and interventions provided to patient. Yes Patient provided with "ticket" for LDCT Scan. yes Written Order for Lung Cancer Screening with LDCT placed in Epic. Yes (CT Chest Lung Cancer Screening Low Dose W/O CM) [id number] Z12.2-Screening of respiratory organs Z87.891-Personal history of nicotine dependence  I spent 25 minutes of face to face time/virtual visit time  with  Ms. Krausz discussing the risks and benefits of lung cancer screening. We took the time to pause the power point at intervals to allow for questions to be asked and answered to ensure understanding. We discussed that she had taken the single most powerful action possible to decrease her risk of developing lung cancer when she quit smoking. I counseled her to remain smoke free, and to contact me if she ever had the desire to smoke again so that I can provide resources and tools to help support the effort to remain smoke free. We discussed the time and location of the scan, and that either  Doroteo Glassman RN, Joella Prince, RN or I  or I will call / send a letter with the results within  24-72 hours of receiving them. She has the office contact information in the event she needs to speak with me,  she verbalized understanding of all of the above and had no further questions upon leaving the office.     I explained to the patient that there has  been a high incidence of coronary artery disease noted on these exams. I explained that this is a non-gated exam therefore degree or severity  cannot be determined. This patient is on statin therapy. I have asked the patient to follow-up with their PCP regarding any incidental finding of coronary artery disease and management with diet or medication as they feel is clinically indicated. The patient verbalized understanding of the above and had no further questions.     Magdalen Spatz, NP 04/25/2022

## 2022-04-26 ENCOUNTER — Ambulatory Visit (HOSPITAL_BASED_OUTPATIENT_CLINIC_OR_DEPARTMENT_OTHER)
Admission: RE | Admit: 2022-04-26 | Discharge: 2022-04-26 | Disposition: A | Discharge status: Home / Self Care | Payer: Medicare Other | Source: Ambulatory Visit | Attending: Acute Care | Admitting: Acute Care

## 2022-04-26 DIAGNOSIS — I2510 Atherosclerotic heart disease of native coronary artery without angina pectoris: Secondary | ICD-10-CM | POA: Insufficient documentation

## 2022-04-26 DIAGNOSIS — J439 Emphysema, unspecified: Secondary | ICD-10-CM | POA: Diagnosis not present

## 2022-04-26 DIAGNOSIS — Z87891 Personal history of nicotine dependence: Secondary | ICD-10-CM | POA: Diagnosis not present

## 2022-04-26 DIAGNOSIS — Z122 Encounter for screening for malignant neoplasm of respiratory organs: Secondary | ICD-10-CM | POA: Diagnosis not present

## 2022-04-26 DIAGNOSIS — I700 Atherosclerosis of aorta: Secondary | ICD-10-CM | POA: Insufficient documentation

## 2022-04-30 ENCOUNTER — Other Ambulatory Visit: Payer: Self-pay

## 2022-04-30 DIAGNOSIS — Z87891 Personal history of nicotine dependence: Secondary | ICD-10-CM

## 2022-04-30 DIAGNOSIS — Z122 Encounter for screening for malignant neoplasm of respiratory organs: Secondary | ICD-10-CM

## 2022-05-10 ENCOUNTER — Telehealth (HOSPITAL_BASED_OUTPATIENT_CLINIC_OR_DEPARTMENT_OTHER): Payer: Self-pay | Admitting: Cardiovascular Disease

## 2022-05-10 NOTE — Telephone Encounter (Signed)
I did not need this encounter. °

## 2022-05-21 DIAGNOSIS — B009 Herpesviral infection, unspecified: Secondary | ICD-10-CM | POA: Diagnosis not present

## 2022-05-21 DIAGNOSIS — E669 Obesity, unspecified: Secondary | ICD-10-CM | POA: Diagnosis not present

## 2022-05-21 DIAGNOSIS — E782 Mixed hyperlipidemia: Secondary | ICD-10-CM | POA: Diagnosis not present

## 2022-05-21 DIAGNOSIS — I10 Essential (primary) hypertension: Secondary | ICD-10-CM | POA: Diagnosis not present

## 2022-05-21 NOTE — Progress Notes (Deleted)
Office Visit    Patient Name: [name] Date of Encounter: 05/21/2022  Primary Care Provider:  Carol Ada, MD Primary Cardiologist:  Sinclair Grooms, MD (Inactive) Primary Electrophysiologist: Melida Quitter, MD  Chief Complaint   Catherine Callahan is a 67 y.o. female with PMH of nonobstructive CAD s/p calcium score 62, PAF s/p AF ablation x2 (on Eliquis), HTN, HLD, OSA (on CPAP), first-degree AV block, obesity who presents today for follow-up of blood pressure.  Past Medical History    Past Medical History:  Diagnosis Date   Arthritis    Atrial flutter ([facility]) 01/05/2013   CAD (coronary artery disease) 07/01/2019   Encounter for monitoring flecainide therapy 01/11/2011   Hypertension, essential, benign 01/11/2011   OSA on CPAP 01/05/2013   Overweight    Paroxysmal atrial fibrillation ([facility])    Ulcerative colitis ([facility])    Past Surgical History:  Procedure Laterality Date   ATRIAL FIBRILLATION ABLATION N/A 06/30/2019   Procedure: ATRIAL FIBRILLATION ABLATION;  Surgeon: Thompson Grayer, MD;  Location: [facility];  Service: Cardiovascular;  Laterality: N/A;   ATRIAL FIBRILLATION ABLATION N/A 01/17/2021   Procedure: ATRIAL FIBRILLATION ABLATION;  Surgeon: Thompson Grayer, MD;  Location: [facility];  Service: Cardiovascular;  Laterality: N/A;   COLON SURGERY  2004   colon resection    Allergies  Allergies  Allergen Reactions   Penicillin G Anaphylaxis   Penicillins Anaphylaxis   Latex Rash and Hives   Meloxicam Hives    History of Present Illness    Catherine Callahan  is a 67 year old female with the above mention past medical history who presents today for follow-up of hypertension.She was seen by me in follow-up on 12/27/2021 with complaint of increased headaches and nosebleeds. Patient's blood pressure was well controlled at 126/88 with heart rate of 85 bpm. She did endorse elevated blood pressures at home and patient's HCTZ was  increased to 25 mg daily with instruction to monitor blood pressures and contact office for elevated readings.  She was seen 01/10/2022 for follow-up of blood pressure and surgical clearance.  She was intolerant to the increase in HCTZ and dose was reduced back to 12.5 mg.  During her visit blood pressures were much improved at 98/58.  She was granted clearance for her procedure and HCTZ was discontinued at that time.  She was continued on losartan and metoprolol.  Since last being seen in the office patient reports***.  Patient denies chest pain, palpitations, dyspnea, PND, orthopnea, nausea, vomiting, dizziness, syncope, edema, weight gain, or early satiety.  ***Notes:  Home Medications    Current Outpatient Medications  Medication Sig Dispense Refill   acetaminophen (TYLENOL) 500 MG tablet Take 1,000 mg by mouth every 6 (six) hours as needed for moderate pain.     acyclovir (ZOVIRAX) 200 MG capsule Take 200 mg by mouth as needed.     ELIQUIS 5 MG TABS tablet TAKE 1 TABLET BY MOUTH TWICE A DAY 180 tablet 2   ezetimibe (ZETIA) 10 MG tablet TAKE 1 TABLET BY MOUTH EVERY DAY 90 tablet 3   FIBER PO Take 4 capsules by mouth at bedtime.     hydrochlorothiazide (MICROZIDE) 12.5 MG capsule TAKE 1 CAPSULE (12.5 MG TOTAL) BY MOUTH DAILY. HOLD IF BLOOD PRESSURE TOP NUMBER IS UNDER 100 90 capsule 3   losartan (COZAAR) 100 MG tablet Take 1 tablet (100 mg total) by mouth daily. 90 tablet 1   Mesalamine 800 MG  TBEC Take 800 mg by mouth daily.      metoprolol succinate (TOPROL-XL) 50 MG 24 hr tablet Take 1 tablet (50 mg total) by mouth daily. Take with or immediately following a meal. 90 tablet 3   Multiple Vitamin (MULTIVITAMIN WITH MINERALS) TABS tablet Take 1 tablet by mouth daily. Centrum Silver     rosuvastatin (CRESTOR) 40 MG tablet TAKE 1 TABLET BY MOUTH EVERY DAY 90 tablet 3   No current facility-administered medications for this visit.     Review of Systems  Please see the history of present  illness.    (+)*** (+)***  All other systems reviewed and are otherwise negative except as noted above.  Physical Exam    Wt Readings from Last 3 Encounters:  04/26/22 240 lb (108.9 kg)  01/10/22 247 lb (112 kg)  12/27/21 246 lb (111.6 kg)   [id number] were no vitals filed for this visit.,There is no height or weight on file to calculate BMI.  Constitutional:      Appearance: Healthy appearance. Not in distress.  Neck:     Vascular: JVD normal.  Pulmonary:     Effort: Pulmonary effort is normal.     Breath sounds: No wheezing. No rales. Diminished in the bases Cardiovascular:     Normal rate. Regular rhythm. Normal S1. Normal S2.      Murmurs: There is no murmur.  Edema:    Peripheral edema absent.  Abdominal:     Palpations: Abdomen is soft non tender. There is no hepatomegaly.  Skin:    General: Skin is warm and dry.  Neurological:     General: No focal deficit present.     Mental Status: Alert and oriented to person, place and time.     Cranial Nerves: Cranial nerves are intact.  EKG/LABS/ Recent Cardiac Studies    ECG personally reviewed by me today - ***  Risk Assessment/Calculations:   {Does this patient have ATRIAL FIBRILLATION?:[phone]}        Lab Results  Component Value Date   WBC 5.6 01/02/2021   HGB 15.0 01/02/2021   HCT 44.4 01/02/2021   MCV 100 (H) 01/02/2021   PLT 164 01/02/2021   Lab Results  Component Value Date   CREATININE 0.88 01/10/2022   BUN 14 01/10/2022   NA 143 01/10/2022   K 4.7 01/10/2022   CL 102 01/10/2022   CO2 28 01/10/2022   Lab Results  Component Value Date   ALT 25 09/29/2020   AST 20 09/29/2020   ALKPHOS 72 09/29/2020   BILITOT 0.6 09/29/2020   Lab Results  Component Value Date   CHOL 136 09/29/2020   HDL 59 09/29/2020   LDLCALC 62 09/29/2020   TRIG 77 09/29/2020   CHOLHDL 2.3 09/29/2020    Lab Results  Component Value Date   HGBA1C 5.2 05/19/2020    Cardiac Studies & Procedures        ECHOCARDIOGRAM  ECHOCARDIOGRAM COMPLETE 10/12/2020  Narrative ECHOCARDIOGRAM REPORT    Patient Name:   Catherine Callahan Date of Exam: 10/12/2020 Medical Rec #:  [id number]          Height:       66.0 in Accession #:    [id number]         Weight:       240.0 lb Date of Birth:  [birth year]          BSA:          2.161 m Patient Age:  65 years           BP:           116/76 mmHg Patient Gender: F                  HR:           66 bpm. Exam Location:  [address]  Procedure: 2D Echo, 3D Echo, Cardiac Doppler and Color Doppler  Indications:    I48.91 Atrial Fibrillation  History:        Patient has prior history of Echocardiogram examinations, most recent 06/22/2019. CAD, Arrythmias:Atrial Flutter; Risk Factors:Sleep Apnea and Hypertension.  Sonographer:    Marygrace Drought RCS Referring Phys: [id number] [place]   1. Left ventricular ejection fraction, by estimation, is 60 to 65%. Left ventricular ejection fraction by 3D volume is 64 %. The left ventricle has normal function. The left ventricle has no regional wall motion abnormalities. Left ventricular diastolic parameters are consistent with Grade II diastolic dysfunction (pseudonormalization). Elevated left atrial pressure. 2. Right ventricular systolic function is normal. The right ventricular size is normal. 3. Left atrial size was mildly dilated. 4. The mitral valve is normal in structure. Mild to moderate mitral valve regurgitation. No evidence of mitral stenosis. 5. Tricuspid valve regurgitation is mild to moderate. 6. The aortic valve is tricuspid. Aortic valve regurgitation is not visualized. Mild aortic valve sclerosis is present, with no evidence of aortic valve stenosis. 7. The inferior vena cava is normal in size with greater than 50% respiratory variability, suggesting right atrial pressure of 3 mmHg.  FINDINGS Left Ventricle: Left ventricular ejection fraction, by estimation, is 60 to 65%.  Left ventricular ejection fraction by 3D volume is 64 %. The left ventricle has normal function. The left ventricle has no regional wall motion abnormalities. The left ventricular internal cavity size was normal in size. There is no left ventricular hypertrophy. Left ventricular diastolic parameters are consistent with Grade II diastolic dysfunction (pseudonormalization). Elevated left atrial pressure.  Right Ventricle: The right ventricular size is normal. No increase in right ventricular wall thickness. Right ventricular systolic function is normal.  Left Atrium: Left atrial size was mildly dilated.  Right Atrium: Right atrial size was normal in size.  Pericardium: There is no evidence of pericardial effusion.  Mitral Valve: The mitral valve is normal in structure. Mild to moderate mitral valve regurgitation, with centrally-directed jet. No evidence of mitral valve stenosis.  Tricuspid Valve: The tricuspid valve is normal in structure. Tricuspid valve regurgitation is mild to moderate. No evidence of tricuspid stenosis.  Aortic Valve: The aortic valve is tricuspid. Aortic valve regurgitation is not visualized. Mild aortic valve sclerosis is present, with no evidence of aortic valve stenosis.  Pulmonic Valve: The pulmonic valve was grossly normal. Pulmonic valve regurgitation is not visualized. No evidence of pulmonic stenosis.  Aorta: The aortic root is normal in size and structure.  Venous: The inferior vena cava is normal in size with greater than 50% respiratory variability, suggesting right atrial pressure of 3 mmHg.  IAS/Shunts: No atrial level shunt detected by color flow Doppler.   LEFT VENTRICLE PLAX 2D LVIDd:         5.10 cm         Diastology LVIDs:         3.50 cm         LV e' medial:    8.16 cm/s LV PW:         0.90 cm  LV E/e' medial:  10.7 LV IVS:        0.90 cm         LV e' lateral:   9.25 cm/s LVOT diam:     2.00 cm         LV E/e' lateral: 9.4 LV SV:          57 LV SV Index:   26 LVOT Area:     3.14 cm        3D Volume EF LV 3D EF:    Left ventricul ar ejection fraction by 3D volume is 64 %.  3D Volume EF: 3D EF:        64 % LV EDV:       103 ml LV ESV:       37 ml LV SV:        66 ml  RIGHT VENTRICLE TAPSE (M-mode): 1.9 cm  LEFT ATRIUM             Index LA diam:        3.80 cm 1.76 cm/m LA Vol (A2C):   53.1 ml 24.57 ml/m LA Vol (A4C):   62.1 ml 28.74 ml/m LA Biplane Vol: 58.1 ml 26.89 ml/m AORTIC VALVE LVOT Vmax:   72.00 cm/s LVOT Vmean:  48.800 cm/s LVOT VTI:    0.181 m  AORTA Ao Root diam: 3.10 cm Ao Asc diam:  3.30 cm  MITRAL VALVE                TRICUSPID VALVE MV Area (PHT): 3.70 cm     TR Peak grad:   31.8 mmHg MV Decel Time: 205 msec     TR Vmax:        282.00 cm/s MR Peak grad:   70.9 mmHg MR Mean grad:   55.0 mmHg   SHUNTS MR Vmax:        421.00 cm/s Systemic VTI:  0.18 m MR Vmean:       352.0 cm/s  Systemic Diam: 2.00 cm MR PISA:        3.08 cm MR PISA Radius: 0.70 cm MV E velocity: 87.41 cm/s MV A velocity: 37.28 cm/s MV E/A ratio:  2.34  Mihai Croitoru MD Electronically signed by Sanda Klein MD Signature Date/Time: 10/12/2020/5:45:16 PM    Final             Assessment & Plan    1.Essential hypertension: -Patient's blood pressure today was well controlled at 98/58 -Continue  losartan 100 mg, and metoprolol succinate 50 mg daily -We will discontinue HCTZ 12.5 due to labile blood pressures with increased dose. -She was encouraged to continue to monitor blood pressures at home.   2.  Paroxysmal atrial fibrillation: -Today patient reports no palpitations or skipped beats. -Continue Toprol-XL 50 mg daily -Continue Eliquis 5 mg twice daily -CHA2DS2-VASc Score = 4 [CHF History: 0, HTN History: 1, Diabetes History: 0, Stroke History: 0, Vascular Disease History: 1, Age Score: 1, Gender Score: 1].  Therefore, the patient's annual risk of stroke is 4.8 %.       3.  Secondary  hypercoagulable state: -Patient currently on Eliquis 5 mg as noted above          Disposition: Follow-up with Sinclair Grooms, MD (Inactive) or APP in *** months {Are you ordering a CV Procedure (e.g. stress test, cath, DCCV, TEE, etc)?   Press F2        :[id number]   Medication Adjustments/Labs and Tests Ordered:  Current medicines are reviewed at length with the patient today.  Concerns regarding medicines are outlined above.   Signed, Mable Fill, Marissa Nestle, NP 05/21/2022, 12:13 PM [facility]  Note:  This document was prepared using Dragon voice recognition software and may include unintentional dictation errors.

## 2022-05-22 ENCOUNTER — Ambulatory Visit: Payer: Medicare Other | Admitting: Nurse Practitioner

## 2022-05-22 DIAGNOSIS — I10 Essential (primary) hypertension: Secondary | ICD-10-CM

## 2022-05-22 DIAGNOSIS — D6869 Other thrombophilia: Secondary | ICD-10-CM

## 2022-05-22 DIAGNOSIS — I480 Paroxysmal atrial fibrillation: Secondary | ICD-10-CM

## 2022-05-30 MED ORDER — METOPROLOL SUCCINATE ER 50 MG PO TB24: 50.0000 mg | ORAL_TABLET | Freq: Every day | ORAL | 3 refills | Status: DC

## 2022-07-02 ENCOUNTER — Other Ambulatory Visit: Payer: Self-pay | Admitting: *Deleted

## 2022-07-02 DIAGNOSIS — K080 Exfoliation of teeth due to systemic causes: Secondary | ICD-10-CM | POA: Diagnosis not present

## 2022-07-02 MED ORDER — EZETIMIBE 10 MG PO TABS: 10.0000 mg | ORAL_TABLET | Freq: Every day | ORAL | 1 refills | Status: DC

## 2022-07-12 DIAGNOSIS — G4733 Obstructive sleep apnea (adult) (pediatric): Secondary | ICD-10-CM | POA: Diagnosis not present

## 2022-07-22 ENCOUNTER — Other Ambulatory Visit: Payer: Self-pay | Admitting: Nurse Practitioner

## 2022-08-02 DIAGNOSIS — I4891 Unspecified atrial fibrillation: Secondary | ICD-10-CM

## 2022-08-03 MED ORDER — APIXABAN 5 MG PO TABS: 5.0000 mg | ORAL_TABLET | Freq: Two times a day (BID) | ORAL | 0 refills | Status: DC

## 2022-08-03 NOTE — Telephone Encounter (Signed)
Prescription refill request for Eliquis received. Indication: Afib  Last office visit: 01/10/22 Catherine Callahan)  Scr: 0.88 (01/10/22)  Age: 67 Weight: 108.9kg  Appropriate dose. Refill sent.

## 2022-08-12 DIAGNOSIS — G4733 Obstructive sleep apnea (adult) (pediatric): Secondary | ICD-10-CM | POA: Diagnosis not present

## 2022-08-14 DIAGNOSIS — R051 Acute cough: Secondary | ICD-10-CM | POA: Diagnosis not present

## 2022-08-14 DIAGNOSIS — B9689 Other specified bacterial agents as the cause of diseases classified elsewhere: Secondary | ICD-10-CM | POA: Diagnosis not present

## 2022-08-14 DIAGNOSIS — J069 Acute upper respiratory infection, unspecified: Secondary | ICD-10-CM | POA: Diagnosis not present

## 2022-09-11 DIAGNOSIS — G4733 Obstructive sleep apnea (adult) (pediatric): Secondary | ICD-10-CM | POA: Diagnosis not present

## 2022-10-10 DIAGNOSIS — G4733 Obstructive sleep apnea (adult) (pediatric): Secondary | ICD-10-CM | POA: Diagnosis not present

## 2022-10-13 DIAGNOSIS — R051 Acute cough: Secondary | ICD-10-CM | POA: Diagnosis not present

## 2022-10-13 DIAGNOSIS — R5383 Other fatigue: Secondary | ICD-10-CM | POA: Diagnosis not present

## 2022-10-13 DIAGNOSIS — R6883 Chills (without fever): Secondary | ICD-10-CM | POA: Diagnosis not present

## 2022-10-13 DIAGNOSIS — U071 COVID-19: Secondary | ICD-10-CM | POA: Diagnosis not present

## 2022-11-02 ENCOUNTER — Ambulatory Visit (HOSPITAL_BASED_OUTPATIENT_CLINIC_OR_DEPARTMENT_OTHER): Payer: Medicare Other | Admitting: Cardiovascular Disease

## 2022-11-02 ENCOUNTER — Encounter (HOSPITAL_BASED_OUTPATIENT_CLINIC_OR_DEPARTMENT_OTHER): Payer: Self-pay | Admitting: Cardiovascular Disease

## 2022-11-02 VITALS — BP 100/60 | HR 54 | Ht 66.0 in | Wt 256.0 lb

## 2022-11-02 DIAGNOSIS — I484 Atypical atrial flutter: Secondary | ICD-10-CM | POA: Diagnosis not present

## 2022-11-02 DIAGNOSIS — I10 Essential (primary) hypertension: Secondary | ICD-10-CM

## 2022-11-02 DIAGNOSIS — Z6841 Body Mass Index (BMI) 40.0 and over, adult: Secondary | ICD-10-CM

## 2022-11-02 DIAGNOSIS — I480 Paroxysmal atrial fibrillation: Secondary | ICD-10-CM | POA: Diagnosis not present

## 2022-11-02 DIAGNOSIS — E785 Hyperlipidemia, unspecified: Secondary | ICD-10-CM

## 2022-11-02 DIAGNOSIS — I2584 Coronary atherosclerosis due to calcified coronary lesion: Secondary | ICD-10-CM | POA: Diagnosis not present

## 2022-11-02 DIAGNOSIS — I2510 Atherosclerotic heart disease of native coronary artery without angina pectoris: Secondary | ICD-10-CM

## 2022-11-02 DIAGNOSIS — G4733 Obstructive sleep apnea (adult) (pediatric): Secondary | ICD-10-CM

## 2022-11-02 NOTE — Patient Instructions (Signed)
Medication Instructions:  STOP hydrochlorothiazide  *If you need a refill on your cardiac medications before your next appointment, please call your pharmacy*  Lab Work: LP/CMET/CBC/A1C TODAY   If you have labs (blood work) drawn today and your tests are completely normal, you will receive your results only by: MyChart Message (if you have MyChart) OR A paper copy in the mail If you have any lab test that is abnormal or we need to change your treatment, we will call you to review the results.  Testing/Procedures: NONE  Follow-Up: At [facility], you and your health needs are our priority.  As part of our continuing mission to provide you with exceptional heart care, we have created designated Provider Care Teams.  These Care Teams include your primary Cardiologist (physician) and Advanced Practice Providers (APPs -  Physician Assistants and Nurse Practitioners) who all work together to provide you with the care you need, when you need it.  We recommend signing up for the patient portal called "MyChart".  Sign up information is provided on this After Visit Summary.  MyChart is used to connect with patients for Virtual Visits (Telemedicine).  Patients are able to view lab/test results, encounter notes, upcoming appointments, etc.  Non-urgent messages can be sent to your provider as well.   To learn more about what you can do with MyChart, go to [email or web address].    Your next appointment:   12 month(s)  Provider:   Chilton Si, MD or Gillian Shields, NP    Other Instructions MONITOR YOUR BLOOD PRESSURE DAILY FOR 1 MONTH SEND READINGS IN [facility]

## 2022-11-02 NOTE — Progress Notes (Signed)
Cardiology Office Note:  .   Date:  11/02/2022  ID:  Catherine Callahan, DOB [birth year], MRN [id number] PCP: Merri Brunette, MD  [facility] Providers Cardiologist:  Lesleigh Noe, MD (Inactive) Electrophysiologist:  Maurice Small, MD  Sleep Medicine:  Armanda Magic, MD     History of Present Illness: .    Catherine Callahan is a 67 y.o. female with PAF status post ablation (2020/2022), hypertension, hyperlipidemia, coronary calcification, and OSA on CPAP here for follow-up.  She is previously a patient of Dr. Katrinka Blazing.  She underwent successful ablation for atrial fibrillation 01/2021.  After ablation she was able to discontinue flecainide.  On her evaluation for ablation she was noted to have a coronary calcium score of 584.  She has been medically managed for nonobstructive coronary disease.  She last saw Dr. Katrinka Blazing 07/2021 and was doing well.  She saw Dr. Nelly Laurence 12/2021 and had 2 brief episodes of atrial fibrillation.  She saw artistic, NP 01/2022 for episodes of uncontrolled blood pressure at home.  HCTZ was increased to 25 mg daily.  Blood pressures had normalized and she reduced her dose back to 12.5 mg.  Blood pressures were then labile so HCTZ was discontinued and she remained on losartan and metoprolol.  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 She reports that she has been doing well overall, with only two brief episodes of atrial fibrillation, each lasting approximately 10-20 minutes. She is aware of when she goes in and out of rhythm, and these episodes were not associated with any particular situation.  The patient has been struggling with low blood pressure, which she believes is related to her weight loss. Earlier in the year, she weighed over 260 lbs, but she has since lost weight and currently weighs around 252 lbs. Her blood pressure readings at home have been low, with readings as  low as 87/63. She is currently on metoprolol, hydrochlorothiazide, and losartan for blood pressure management, with the hydrochlorothiazide taken at noon due to previous issues with elevated blood pressure in the late afternoon.  Catherine Callahan also mentions that she has been experiencing episodes of tearing, which she believes may be due to allergies. She has tried allergy medications in the past without improvement. Interestingly, she noticed an improvement in her symptoms while on vacation in [place] and [place].  The patient is compliant with her CPAP for sleep apnea and wears it religiously. She is also on rosuvastatin and Zetia for cholesterol management and Eliquis for atrial fibrillation. She has been making efforts to maintain a low-carb, high-protein diet and avoids sweets and sugary drinks. She walks her dog for exercise, but has been limited recently due to the hot weather. She plans to resume regular walks once the weather cools down.      ROS:  +tearing eyes  Studies Reviewed: .       Echo 10/2020:  1. Left ventricular ejection fraction, by estimation, is 60 to 65%. Left  ventricular ejection fraction by 3D volume is 64 %. The left ventricle has  normal function. The left ventricle has no regional wall motion  abnormalities. Left ventricular diastolic   parameters are consistent with Grade II diastolic dysfunction  (pseudonormalization). Elevated left atrial pressure.   2. Right ventricular systolic function is normal. The right ventricular  size is normal.   3. Left atrial size was mildly dilated.   4. The  mitral valve is normal in structure. Mild to moderate mitral valve  regurgitation. No evidence of mitral stenosis.   5. Tricuspid valve regurgitation is mild to moderate.   6. The aortic valve is tricuspid. Aortic valve regurgitation is not  visualized. Mild aortic valve sclerosis is present, with no evidence of  aortic valve stenosis.   7. The inferior vena cava is normal  in size with greater than 50%  respiratory variability, suggesting right atrial pressure of 3 mmHg.   Risk Assessment/Calculations:    CHA2DS2-VASc Score = 4   This indicates a 4.8% annual risk of stroke. The patient's score is based upon: CHF History: 0 HTN History: 1 Diabetes History: 0 Stroke History: 0 Vascular Disease History: 1 Age Score: 1 Gender Score: 1            Physical Exam:   VS:  BP 100/60 (BP Location: Left Arm, Patient Position: Sitting, Cuff Size: Normal)   Pulse (!) 54   Ht 5\' 6"  (1.676 m)   Wt 256 lb (116.1 kg)   SpO2 97%   BMI 41.32 kg/m  , BMI Body mass index is 41.32 kg/m. GENERAL:  Well appearing HEENT: Pupils equal round and reactive, fundi not visualized, oral mucosa unremarkable NECK:  No jugular venous distention, waveform within normal limits, carotid upstroke brisk and symmetric, no bruits, no thyromegaly LYMPHATICS:  No cervical adenopathy LUNGS:  Clear to auscultation bilaterally HEART:  RRR.  PMI not displaced or sustained,S1 and S2 within normal limits, no S3, no S4, no clicks, no rubs, no murmurs ABD:  Flat, positive bowel sounds normal in frequency in pitch, no bruits, no rebound, no guarding, no midline pulsatile mass, no hepatomegaly, no splenomegaly EXT:  2 plus pulses throughout, no edema, no cyanosis no clubbing SKIN:  No rashes no nodules NEURO:  Cranial nerves II through XII grossly intact, motor grossly intact throughout PSYCH:  Cognitively intact, oriented to person place and time   ASSESSMENT AND PLAN: .    # Atrial Fibrillation Stable since last ablation over a year ago with only two brief episodes of arrhythmia. No change in symptoms or frequency. -Continue current management with Eliquis.   # Hypertension Low blood pressure readings at home, possibly related to weight loss and current medication regimen. No recent episodes of elevated blood pressure. -Discontinue Hydrochlorothiazide. -Monitor blood pressure at home and  report readings via MyChart.  # Hyperlipidemia On Rosuvastatin and Zetia. No recent lipid panel. -Continue current medications. -Order lipid panel, comprehensive metabolic panel, A1C, and CBC today.  # Obesity Patient reports weight loss efforts and low-carb, high-protein diet. Current weight around 252 lbs. -Encourage continued weight loss efforts and regular exercise.  Follow-up in 1 year unless blood pressure readings or other concerns necessitate earlier visit.             Dispo: f/u 1 year  Signed, Chilton Si, MD

## 2022-11-03 LAB — CBC WITH DIFFERENTIAL/PLATELET
Basophils Absolute: 0.1 10*3/uL (ref 0.0–0.2)
Basos: 1 %
EOS (ABSOLUTE): 0.3 10*3/uL (ref 0.0–0.4)
Eos: 5 %
Hematocrit: 42.5 % (ref 34.0–46.6)
Hemoglobin: 13.9 g/dL (ref 11.1–15.9)
Immature Grans (Abs): 0 10*3/uL (ref 0.0–0.1)
Immature Granulocytes: 0 %
Lymphocytes Absolute: 1.9 10*3/uL (ref 0.7–3.1)
Lymphs: 35 %
MCH: 34.4 pg — ABNORMAL HIGH (ref 26.6–33.0)
MCHC: 32.7 g/dL (ref 31.5–35.7)
MCV: 105 fL — ABNORMAL HIGH (ref 79–97)
Monocytes Absolute: 0.7 10*3/uL (ref 0.1–0.9)
Monocytes: 12 %
Neutrophils Absolute: 2.7 10*3/uL (ref 1.4–7.0)
Neutrophils: 47 %
Platelets: 198 10*3/uL (ref 150–450)
RBC: 4.04 x10E6/uL (ref 3.77–5.28)
RDW: 12.9 % (ref 11.7–15.4)
WBC: 5.6 10*3/uL (ref 3.4–10.8)

## 2022-11-03 LAB — HEMOGLOBIN A1C
Est. average glucose Bld gHb Est-mCnc: 123 mg/dL
Hgb A1c MFr Bld: 5.9 % — ABNORMAL HIGH (ref 4.8–5.6)

## 2022-11-03 LAB — COMPREHENSIVE METABOLIC PANEL
ALT: 31 IU/L (ref 0–32)
AST: 32 IU/L (ref 0–40)
Albumin: 4.4 g/dL (ref 3.9–4.9)
Alkaline Phosphatase: 87 IU/L (ref 44–121)
BUN/Creatinine Ratio: 14 (ref 12–28)
BUN: 13 mg/dL (ref 8–27)
Bilirubin Total: 0.4 mg/dL (ref 0.0–1.2)
CO2: 25 mmol/L (ref 20–29)
Calcium: 10 mg/dL (ref 8.7–10.3)
Chloride: 102 mmol/L (ref 96–106)
Creatinine, Ser: 0.9 mg/dL (ref 0.57–1.00)
Globulin, Total: 2.5 g/dL (ref 1.5–4.5)
Glucose: 94 mg/dL (ref 70–99)
Potassium: 4.2 mmol/L (ref 3.5–5.2)
Sodium: 145 mmol/L — ABNORMAL HIGH (ref 134–144)
Total Protein: 6.9 g/dL (ref 6.0–8.5)
eGFR: 70 mL/min/{1.73_m2} (ref >59)

## 2022-11-03 LAB — LIPID PANEL
Chol/HDL Ratio: 2 ratio (ref 0.0–4.4)
Cholesterol, Total: 142 mg/dL (ref 100–199)
HDL: 71 mg/dL (ref >39)
LDL Chol Calc (NIH): 56 mg/dL (ref 0–99)
Triglycerides: 78 mg/dL (ref 0–149)
VLDL Cholesterol Cal: 15 mg/dL (ref 5–40)

## 2022-11-10 DIAGNOSIS — G4733 Obstructive sleep apnea (adult) (pediatric): Secondary | ICD-10-CM | POA: Diagnosis not present

## 2022-11-29 ENCOUNTER — Encounter (HOSPITAL_BASED_OUTPATIENT_CLINIC_OR_DEPARTMENT_OTHER): Payer: Self-pay | Admitting: Cardiovascular Disease

## 2022-11-29 DIAGNOSIS — I4891 Unspecified atrial fibrillation: Secondary | ICD-10-CM

## 2022-11-29 MED ORDER — APIXABAN 5 MG PO TABS: 5.0000 mg | ORAL_TABLET | Freq: Two times a day (BID) | ORAL | 6 refills | Status: DC

## 2022-11-29 NOTE — Telephone Encounter (Signed)
Pt last saw Dr Duke Salvia 11/02/22, last labs 11/02/22 Creat 0.90, age 67, weight 116.1kg, based on specified criteria pt is on appropriate dosage of Eliquis 5mg  BID for afib.  Will refill rx.

## 2022-12-07 ENCOUNTER — Other Ambulatory Visit: Payer: Self-pay | Admitting: Family Medicine

## 2022-12-07 DIAGNOSIS — Z1231 Encounter for screening mammogram for malignant neoplasm of breast: Secondary | ICD-10-CM

## 2022-12-10 DIAGNOSIS — G4733 Obstructive sleep apnea (adult) (pediatric): Secondary | ICD-10-CM | POA: Diagnosis not present

## 2022-12-20 DIAGNOSIS — K5100 Ulcerative (chronic) pancolitis without complications: Secondary | ICD-10-CM | POA: Diagnosis not present

## 2022-12-20 DIAGNOSIS — I480 Paroxysmal atrial fibrillation: Secondary | ICD-10-CM | POA: Diagnosis not present

## 2022-12-20 DIAGNOSIS — Z0000 Encounter for general adult medical examination without abnormal findings: Secondary | ICD-10-CM | POA: Diagnosis not present

## 2022-12-20 DIAGNOSIS — D6869 Other thrombophilia: Secondary | ICD-10-CM | POA: Diagnosis not present

## 2022-12-20 DIAGNOSIS — Z1331 Encounter for screening for depression: Secondary | ICD-10-CM | POA: Diagnosis not present

## 2022-12-20 DIAGNOSIS — I700 Atherosclerosis of aorta: Secondary | ICD-10-CM | POA: Diagnosis not present

## 2022-12-25 DIAGNOSIS — H5213 Myopia, bilateral: Secondary | ICD-10-CM | POA: Diagnosis not present

## 2023-01-03 ENCOUNTER — Ambulatory Visit
Admission: RE | Admit: 2023-01-03 | Discharge: 2023-01-03 | Disposition: A | Discharge status: Home / Self Care | Payer: Medicare Other | Source: Ambulatory Visit | Attending: Family Medicine | Admitting: Family Medicine

## 2023-01-03 DIAGNOSIS — Z1231 Encounter for screening mammogram for malignant neoplasm of breast: Secondary | ICD-10-CM

## 2023-01-08 DIAGNOSIS — K080 Exfoliation of teeth due to systemic causes: Secondary | ICD-10-CM | POA: Diagnosis not present

## 2023-01-24 ENCOUNTER — Other Ambulatory Visit (HOSPITAL_BASED_OUTPATIENT_CLINIC_OR_DEPARTMENT_OTHER): Payer: Self-pay | Admitting: Cardiovascular Disease

## 2023-01-28 DIAGNOSIS — K5190 Ulcerative colitis, unspecified, without complications: Secondary | ICD-10-CM | POA: Diagnosis not present

## 2023-01-28 DIAGNOSIS — Z8679 Personal history of other diseases of the circulatory system: Secondary | ICD-10-CM | POA: Diagnosis not present

## 2023-01-28 DIAGNOSIS — Z860101 Personal history of adenomatous and serrated colon polyps: Secondary | ICD-10-CM | POA: Diagnosis not present

## 2023-02-04 ENCOUNTER — Other Ambulatory Visit (HOSPITAL_BASED_OUTPATIENT_CLINIC_OR_DEPARTMENT_OTHER): Payer: Self-pay | Admitting: Cardiovascular Disease

## 2023-03-10 NOTE — Progress Notes (Signed)
  Electrophysiology Office Note:   Date:  03/11/2023  ID:  Nyree Y Roussell, DOB 1955-04-06, MRN [id number]  Primary Cardiologist: Victory LELON Claudene DOUGLAS, MD (Inactive) Electrophysiologist: Eulas FORBES Furbish, MD      History of Present Illness:   Catherine Callahan is a 68 y.o. female with h/o AF s/p ablation x52m AFL, CAD, HTN, OSA on CPAP, and obesity seen today for routine electrophysiology followup.   Since last being seen in our clinic the patient reports doing well overall. She had a 3-4 week period where she was going out of rhythm almost every other day, lasting minutes to a couple of hours.  Denies any big life changes, stressors, illnesses, or medication changes. She scheduled this appointment, and now for the past 2 weeks has not had any further. Denies edema or chest pain. Was wiped out when she was in AF.   Review of systems complete and found to be negative unless listed in HPI.   EP Information / Studies Reviewed:    EKG is ordered today. Personal review as below.  EKG Interpretation Date/Time:  Monday March 11 2023 10:30:30 EST Ventricular Rate:  76 PR Interval:  254 QRS Duration:  70 QT Interval:  392 QTC Calculation: 441 R Axis:   17  Text Interpretation: Sinus rhythm with 1st degree A-V block Possible Anterior infarct , age undetermined When compared with ECG of 14-Feb-2021 10:36, No significant change was found Confirmed by Lesia Sharper [phone]) on 03/11/2023 10:36:16 AM    Arrhythmia History  AF Ablation 06/2019 and redo 01/2021  Physical Exam:   VS:  BP 104/76   Pulse 76   Ht 5' 6 (1.676 m)   Wt 268 lb 9.6 oz (121.8 kg)   SpO2 95%   BMI 43.35 kg/m    Wt Readings from Last 3 Encounters:  03/11/23 268 lb 9.6 oz (121.8 kg)  11/02/22 256 lb (116.1 kg)  04/26/22 240 lb (108.9 kg)     GEN: Well nourished, well developed in no acute distress NECK: No JVD; No carotid bruits CARDIAC: Regular rate and rhythm, no murmurs, rubs, gallops RESPIRATORY:  Clear to  auscultation without rales, wheezing or rhonchi  ABDOMEN: Soft, non-tender, non-distended EXTREMITIES:  No edema; No deformity   ASSESSMENT AND PLAN:    Paroxysmal AF Atrial flutter EKG today shows NSR S/p ablation x 2.  Continue eliquis  5 mg BID for CHA2DS2VASc of at least 3  Continue toprol  50 mg daily Add lopressor  25 mg to take as needed for breakthrough.  Discussed tikosyn  and amiodarone today. QT and CrCl within good range for tikosyn  consideration. She would prefer to avoid amiodarone given possibility of off target effects. She will consider tikosyn  if she has additional AF   HTN Stable on current regimen   OSA  Encouraged nightly CPAP   Obesity Body mass index is 43.35 kg/m.  Encouraged lifestyle modification with dieting and exercise as tolerated given her knee  Follow up with Afib Clinic  in 4-6 weeks for Tikosyn  consideration if she has further breakthrough. Placeholder appt with me in 6 months.   Signed, Sharper Prentice Lesia, PA-C

## 2023-03-11 ENCOUNTER — Ambulatory Visit: Payer: Medicare Other | Attending: Student | Admitting: Student

## 2023-03-11 ENCOUNTER — Encounter: Payer: Self-pay | Admitting: Student

## 2023-03-11 VITALS — BP 104/76 | HR 76 | Ht 66.0 in | Wt 268.6 lb

## 2023-03-11 DIAGNOSIS — I2510 Atherosclerotic heart disease of native coronary artery without angina pectoris: Secondary | ICD-10-CM | POA: Diagnosis not present

## 2023-03-11 DIAGNOSIS — I10 Essential (primary) hypertension: Secondary | ICD-10-CM | POA: Diagnosis not present

## 2023-03-11 DIAGNOSIS — I480 Paroxysmal atrial fibrillation: Secondary | ICD-10-CM | POA: Diagnosis not present

## 2023-03-11 DIAGNOSIS — I484 Atypical atrial flutter: Secondary | ICD-10-CM | POA: Diagnosis not present

## 2023-03-11 DIAGNOSIS — G4733 Obstructive sleep apnea (adult) (pediatric): Secondary | ICD-10-CM

## 2023-03-11 MED ORDER — METOPROLOL TARTRATE 25 MG PO TABS: 25.0000 mg | ORAL_TABLET | ORAL | 3 refills | Status: DC | PRN

## 2023-03-11 NOTE — Patient Instructions (Signed)
 Medication Instructions:  Start metoprolol  tartrate (Lopressor ) 25 mg as needed *If you need a refill on your cardiac medications before your next appointment, please call your pharmacy*  Lab Work: None ordered If you have labs (blood work) drawn today and your tests are completely normal, you will receive your results only by: MyChart Message (if you have MyChart) OR A paper copy in the mail If you have any lab test that is abnormal or we need to change your treatment, we will call you to review the results.  Follow-Up: At [facility], you and your health needs are our priority.  As part of our continuing mission to provide you with exceptional heart care, we have created designated Provider Care Teams.  These Care Teams include your primary Cardiologist (physician) and Advanced Practice Providers (APPs -  Physician Assistants and Nurse Practitioners) who all work together to provide you with the care you need, when you need it.  Your next appointment:   6 month(s)  Provider:   Ozell Jodie Passey, PA-C   Schedule 4-6 week follow up with the A-fib clinic.

## 2023-03-15 ENCOUNTER — Other Ambulatory Visit: Payer: Self-pay | Admitting: Nurse Practitioner

## 2023-03-22 ENCOUNTER — Ambulatory Visit: Payer: Medicare Other | Attending: Cardiology | Admitting: Cardiology

## 2023-03-22 ENCOUNTER — Encounter: Payer: Self-pay | Admitting: Cardiology

## 2023-03-22 VITALS — BP 114/80 | HR 80 | Ht 66.0 in | Wt 268.0 lb

## 2023-03-22 DIAGNOSIS — I10 Essential (primary) hypertension: Secondary | ICD-10-CM

## 2023-03-22 DIAGNOSIS — G4733 Obstructive sleep apnea (adult) (pediatric): Secondary | ICD-10-CM

## 2023-03-22 NOTE — Progress Notes (Signed)
Sleep Medicine CONSULT Note    Date:  03/22/2023   ID:  Catherine Callahan, DOB March 09, 1955, MRN [id number]  PCP:  Merri Brunette, MD  Cardiologist: Skeet Simmer, MD  Chief Complaint  Patient presents with   New Patient (Initial Visit)    OSA    History of Present Illness:  Catherine Callahan is a 68 y.o. female who is being seen today for the evaluation of OSA at the request of Skeet Simmer, MD.  History of Present Illness:    Catherine Callahan is a 68 y.o. female with a history of OSA, HTN and obesity. She also has a hx of CAD, PAD and HLD.  She is followed in afib clinic. She was lost to followup for Sleep Medicine and is now referred back for Sleep Medicine consult to reestablish care.  She is doing well with her PAP device and thinks that she has gotten used to it.  She tolerates the full face mask and feels the pressure is adequate.  Since going on PAP she feels rested in the am and has no significant daytime sleepiness.  She denies any significant mouth or nasal dryness or nasal congestion.  She does not think that he snores.    Prior CV studies:   The following studies were reviewed today:  PAP compliance download  Past Medical History:  Diagnosis Date   Arthritis    Atrial flutter (HCC) 01/05/2013   CAD (coronary artery disease) 07/01/2019   Encounter for monitoring flecainide therapy 01/11/2011   Hypertension, essential, benign 01/11/2011   OSA on CPAP 01/05/2013   Overweight    Paroxysmal atrial fibrillation (HCC)    Ulcerative colitis (HCC)    Past Surgical History:  Procedure Laterality Date   ATRIAL FIBRILLATION ABLATION N/A 06/30/2019   Procedure: ATRIAL FIBRILLATION ABLATION;  Surgeon: [name], MD;  Location: [facility] INVASIVE CV LAB;  Service: Cardiovascular;  Laterality: N/A;   ATRIAL FIBRILLATION ABLATION N/A 01/17/2021   Procedure: ATRIAL FIBRILLATION ABLATION;  Surgeon: [name], MD;  Location: [facility] INVASIVE CV LAB;  Service:  Cardiovascular;  Laterality: N/A;   COLON SURGERY  2004   colon resection     Current Meds  Medication Sig   acetaminophen (TYLENOL) 500 MG tablet Take 1,000 mg by mouth every 6 (six) hours as needed for moderate pain.   acyclovir (ZOVIRAX) 200 MG capsule Take 200 mg by mouth as needed.   apixaban (ELIQUIS) 5 MG TABS tablet Take 1 tablet (5 mg total) by mouth 2 (two) times daily.   ezetimibe (ZETIA) 10 MG tablet TAKE 1 TABLET BY MOUTH EVERY DAY   FIBER PO Take 4 capsules by mouth at bedtime.   hydrochlorothiazide (MICROZIDE) 12.5 MG capsule TAKE 1 CAPSULE (12.5 MG TOTAL) BY MOUTH DAILY. HOLD IF BLOOD PRESSURE TOP NUMBER IS UNDER 100   losartan (COZAAR) 100 MG tablet TAKE 1 TABLET BY MOUTH EVERY DAY   Mesalamine 800 MG TBEC Take 800 mg by mouth daily.    metoprolol succinate (TOPROL-XL) 50 MG 24 hr tablet TAKE 1 TABLET BY MOUTH EVERY DAY. TAKE IMMEDIATELY FOLLOWING A MEAL.   metoprolol tartrate (LOPRESSOR) 25 MG tablet Take 1 tablet (25 mg total) by mouth as needed.   Multiple Vitamin (MULTIVITAMIN WITH MINERALS) TABS tablet Take 1 tablet by mouth daily. Centrum Silver   rosuvastatin (CRESTOR) 40 MG tablet TAKE 1 TABLET BY MOUTH EVERY DAY     Allergies:   Penicillin g, Penicillins, Latex, and Meloxicam  Social History   Tobacco Use   Smoking status: Former    Current packs/day: 0.00    Average packs/day: 0.8 packs/day for 38.0 years (28.5 ttl pk-yrs)    Types: Cigarettes    Start date: 76    Quit date: 2012    Years since quitting: 13.0    Passive exposure: Never   Smokeless tobacco: Never  Vaping Use   Vaping status: Never Used  Substance Use Topics   Alcohol use: Yes    Alcohol/week: 4.0 - 6.0 standard drinks of alcohol    Types: 4 - 6 Cans of beer per week    Comment: 2 beers 3-4 times a week 02/14/2021   Drug use: No     Family Hx: The patient's family history includes Alzheimer's disease in her mother; Atrial fibrillation in her father; Breast cancer (age of  onset: 35) in her sister; CAD (age of onset: 43) in her brother; Healthy in her brother and sister; Heart disease in her brother and mother; Other in her brother; Stroke in her father.  ROS:   Please see the history of present illness.     All other systems reviewed and are negative.   Labs/Other Tests and Data Reviewed:    Recent Labs: 11/02/2022: ALT 31; BUN 13; Creatinine, Ser 0.90; Hemoglobin 13.9; Platelets 198; Potassium 4.2; Sodium 145   Recent Lipid Panel Lab Results  Component Value Date/Time   CHOL 142 11/02/2022 08:45 AM   TRIG 78 11/02/2022 08:45 AM   HDL 71 11/02/2022 08:45 AM   CHOLHDL 2.0 11/02/2022 08:45 AM   CHOLHDL 3.3 04/10/2010 03:40 AM   LDLCALC 56 11/02/2022 08:45 AM    Wt Readings from Last 3 Encounters:  03/22/23 268 lb (121.6 kg)  03/11/23 268 lb 9.6 oz (121.8 kg)  11/02/22 256 lb (116.1 kg)     Objective:    Vital Signs:  BP 114/80   Pulse 80   Ht 5\' 6"  (1.676 m)   Wt 268 lb (121.6 kg)   BMI 43.26 kg/m   GEN: Well nourished, well developed in no acute distress HEENT: Normal NECK: No JVD; No carotid bruits LYMPHATICS: No lymphadenopathy CARDIAC:RRR, no murmurs, rubs, gallops RESPIRATORY:  Clear to auscultation without rales, wheezing or rhonchi  ABDOMEN: Soft, non-tender, non-distended MUSCULOSKELETAL:  No edema; No deformity  SKIN: Warm and dry NEUROLOGIC:  Alert and oriented x 3 PSYCHIATRIC:  Normal affect  ASSESSMENT & PLAN:    OSA - The patient is tolerating PAP therapy well without any problems. The PAP download performed by his DME was personally reviewed and interpreted by me today and showed an AHI of 1.3 /hr on 14 cm H2O with 100% compliance in using more than 4 hours nightly.  The patient has been using and benefiting from PAP use and will continue to benefit from therapy.  -continue current CPAP settings  HTN -BP controlled on exam today -continue prescription drug management with Toprol XL 25mg  daily with PRN  refills    Medication Adjustments/Labs and Tests Ordered: Current medicines are reviewed at length with the patient today.  Concerns regarding medicines are outlined above.  Tests Ordered: No orders of the defined types were placed in this encounter.  Medication Changes: No orders of the defined types were placed in this encounter.   Disposition:  Follow up in 1 year(s)  Signed, Armanda Magic, MD  03/22/2023 8:58 AM     [facility]

## 2023-03-22 NOTE — Patient Instructions (Signed)
Medication Instructions:  Your physician recommends that you continue on your current medications as directed. Please refer to the Current Medication list given to you today.  *If you need a refill on your cardiac medications before your next appointment, please call your pharmacy*  Lab Work: None ordered today.  Testing/Procedures: None ordered today.  Follow-Up: At [facility], you and your health needs are our priority.  As part of our continuing mission to provide you with exceptional heart care, we have created designated Provider Care Teams.  These Care Teams include your primary Cardiologist (physician) and Advanced Practice Providers (APPs -  Physician Assistants and Nurse Practitioners) who all work together to provide you with the care you need, when you need it.  Your next appointment:   1 year(s)  The format for your next appointment:   In Person  Provider:   Armanda Magic, MD {

## 2023-04-04 DIAGNOSIS — G4733 Obstructive sleep apnea (adult) (pediatric): Secondary | ICD-10-CM | POA: Diagnosis not present

## 2023-04-08 ENCOUNTER — Ambulatory Visit (HOSPITAL_COMMUNITY)
Admission: RE | Admit: 2023-04-08 | Discharge: 2023-04-08 | Disposition: A | Discharge status: Home / Self Care | Payer: Medicare Other | Source: Ambulatory Visit | Attending: Physician Assistant | Admitting: Physician Assistant

## 2023-04-08 ENCOUNTER — Encounter (HOSPITAL_COMMUNITY): Payer: Self-pay | Admitting: Physician Assistant

## 2023-04-08 ENCOUNTER — Other Ambulatory Visit (HOSPITAL_COMMUNITY): Payer: Self-pay

## 2023-04-08 VITALS — BP 114/60 | HR 96 | Ht 66.0 in | Wt 266.4 lb

## 2023-04-08 DIAGNOSIS — I480 Paroxysmal atrial fibrillation: Secondary | ICD-10-CM | POA: Insufficient documentation

## 2023-04-08 DIAGNOSIS — Z7901 Long term (current) use of anticoagulants: Secondary | ICD-10-CM | POA: Insufficient documentation

## 2023-04-08 DIAGNOSIS — Z79899 Other long term (current) drug therapy: Secondary | ICD-10-CM | POA: Insufficient documentation

## 2023-04-08 DIAGNOSIS — G4733 Obstructive sleep apnea (adult) (pediatric): Secondary | ICD-10-CM | POA: Insufficient documentation

## 2023-04-08 DIAGNOSIS — E669 Obesity, unspecified: Secondary | ICD-10-CM | POA: Diagnosis not present

## 2023-04-08 DIAGNOSIS — D6869 Other thrombophilia: Secondary | ICD-10-CM | POA: Insufficient documentation

## 2023-04-08 DIAGNOSIS — I10 Essential (primary) hypertension: Secondary | ICD-10-CM | POA: Insufficient documentation

## 2023-04-08 DIAGNOSIS — Z6841 Body Mass Index (BMI) 40.0 and over, adult: Secondary | ICD-10-CM | POA: Diagnosis not present

## 2023-04-08 DIAGNOSIS — I4892 Unspecified atrial flutter: Secondary | ICD-10-CM | POA: Diagnosis not present

## 2023-04-08 DIAGNOSIS — I2510 Atherosclerotic heart disease of native coronary artery without angina pectoris: Secondary | ICD-10-CM | POA: Diagnosis not present

## 2023-04-08 LAB — BASIC METABOLIC PANEL
Anion gap: 11 (ref 5–15)
BUN: 17 mg/dL (ref 8–23)
CO2: 23 mmol/L (ref 22–32)
Calcium: 9.9 mg/dL (ref 8.9–10.3)
Chloride: 104 mmol/L (ref 98–111)
Creatinine, Ser: 0.94 mg/dL (ref 0.44–1.00)
GFR, Estimated: >60 mL/min (ref >60)
Glucose, Bld: 101 mg/dL — ABNORMAL HIGH (ref 70–99)
Potassium: 4.7 mmol/L (ref 3.5–5.1)
Sodium: 138 mmol/L (ref 135–145)

## 2023-04-08 LAB — MAGNESIUM: Magnesium: 1.5 mg/dL — ABNORMAL LOW (ref 1.7–2.4)

## 2023-04-08 MED ORDER — MAGNESIUM OXIDE -MG SUPPLEMENT 400 (240 MG) MG PO TABS: 400.0000 mg | ORAL_TABLET | Freq: Every day | ORAL | 1 refills | Status: DC

## 2023-04-08 NOTE — Patient Instructions (Signed)
Stop hydrochlorothiazide medication today !

## 2023-04-08 NOTE — Progress Notes (Signed)
Primary Care Physician: Merri Brunette, MD Primary Cardiologist: Dr Duke Salvia  Primary Electrophysiologist: Dr Nelly Laurence  Referring Physician: Dr Lavon Paganini is a 68 y.o. female with a history of atrial flutter, paroxysmal atrial fibrillation, OSA, HTN, and ulcerative colitis who presents for follow up in the [facility].  The patient was initially diagnosed with atrial flutter in 2012 and subsequently atrial fibrillation. She was maintaining on flecainide but had increasing episodes of afib with symptoms of SOB, fatigue, and palpitations. Patient is on Eliquis for a CHADS2VASC score of 3. She underwent afib and flutter ablation with Dr Johney Frame on 06/30/19. Unfortunately, she continued to have symptomatic afib and had repeat ablation (both PVI and CTI) on 01/17/21.  Patient returns for follow up for atrial fibrillation. She reports that she has had much more frequent afib episodes over the past 2-3 months. Her episodes last minutes to hours. She is very fatigued when in afib. There are no specific triggers that she can identify. No bleeding issues on anticoagulation.   Today, he denies symptoms of palpitations, chest pain, orthopnea, PND, lower extremity edema, dizziness, presyncope, syncope, snoring, daytime somnolence, bleeding, or neurologic sequela. The patient is tolerating medications without difficulties and is otherwise without complaint today.    Atrial Fibrillation Risk Factors:  she does have symptoms or diagnosis of sleep apnea.   Atrial Fibrillation Management history:  Previous antiarrhythmic drugs: flecainide Previous cardioversions: none Previous ablations: 06/30/19 afib and flutter, 01/17/21 afib and flutter Anticoagulation history: Eliquis   Past Medical History:  Diagnosis Date   Arthritis    Atrial flutter (HCC) 01/05/2013   CAD (coronary artery disease) 07/01/2019   Encounter for monitoring flecainide therapy 01/11/2011    Hypertension, essential, benign 01/11/2011   OSA on CPAP 01/05/2013   Overweight    Paroxysmal atrial fibrillation (HCC)    Ulcerative colitis (HCC)     Current Outpatient Medications  Medication Sig Dispense Refill   acetaminophen (TYLENOL) 500 MG tablet Take 1,000 mg by mouth every 6 (six) hours as needed for moderate pain.     acyclovir (ZOVIRAX) 200 MG capsule Take 200 mg by mouth as needed.     apixaban (ELIQUIS) 5 MG TABS tablet Take 1 tablet (5 mg total) by mouth 2 (two) times daily. 60 tablet 6   ezetimibe (ZETIA) 10 MG tablet TAKE 1 TABLET BY MOUTH EVERY DAY 90 tablet 3   FIBER PO Take 4 capsules by mouth at bedtime.     hydrochlorothiazide (MICROZIDE) 12.5 MG capsule TAKE 1 CAPSULE (12.5 MG TOTAL) BY MOUTH DAILY. HOLD IF BLOOD PRESSURE TOP NUMBER IS UNDER 100 90 capsule 3   losartan (COZAAR) 100 MG tablet TAKE 1 TABLET BY MOUTH EVERY DAY 90 tablet 2   Mesalamine 800 MG TBEC Take 800 mg by mouth daily.      metoprolol succinate (TOPROL-XL) 50 MG 24 hr tablet TAKE 1 TABLET BY MOUTH EVERY DAY. TAKE IMMEDIATELY FOLLOWING A MEAL. 90 tablet 3   metoprolol tartrate (LOPRESSOR) 25 MG tablet Take 1 tablet (25 mg total) by mouth as needed. 45 tablet 3   Multiple Vitamin (MULTIVITAMIN WITH MINERALS) TABS tablet Take 1 tablet by mouth daily. Centrum Silver     rosuvastatin (CRESTOR) 40 MG tablet TAKE 1 TABLET BY MOUTH EVERY DAY 90 tablet 3   No current facility-administered medications for this encounter.    ROS- All systems are reviewed and negative except as per the HPI above.  Physical Exam: Vitals:  04/08/23 0854  BP: 114/60  Pulse: 96  Weight: 120.8 kg  Height: 5\' 6"  (1.676 m)     GEN: Well nourished, well developed in no acute distress CARDIAC: Irregularly irregular rate and rhythm, no murmurs, rubs, gallops RESPIRATORY:  Clear to auscultation without rales, wheezing or rhonchi  ABDOMEN: Soft, non-tender, non-distended EXTREMITIES:  No edema; No deformity    Wt  Readings from Last 3 Encounters:  04/08/23 120.8 kg  03/22/23 121.6 kg  03/11/23 121.8 kg    EKG today demonstrates  Coarse afib vs atypical atrial flutter with variable block, PVC Vent. rate 96 BPM PR interval * ms QRS duration 72 ms QT/QTcB 342/432 ms   Echo 10/12/20 demonstrated  1. Left ventricular ejection fraction, by estimation, is 60 to 65%. Left  ventricular ejection fraction by 3D volume is 64 %. The left ventricle has normal function. The left ventricle has no regional wall motion  abnormalities. Left ventricular diastolic parameters are consistent with Grade II diastolic dysfunction (pseudonormalization). Elevated left atrial pressure.   2. Right ventricular systolic function is normal. The right ventricular  size is normal.   3. Left atrial size was mildly dilated.   4. The mitral valve is normal in structure. Mild to moderate mitral valve  regurgitation. No evidence of mitral stenosis.   5. Tricuspid valve regurgitation is mild to moderate.   6. The aortic valve is tricuspid. Aortic valve regurgitation is not  visualized. Mild aortic valve sclerosis is present, with no evidence of  aortic valve stenosis.   7. The inferior vena cava is normal in size with greater than 50%  respiratory variability, suggesting right atrial pressure of 3 mmHg.   Epic records are reviewed at length today  CHA2DS2-VASc Score = 4  The patient's score is based upon: CHF History: 0 HTN History: 1 Diabetes History: 0 Stroke History: 0 Vascular Disease History: 1 Age Score: 1 Gender Score: 1       ASSESSMENT AND PLAN: Paroxysmal Atrial Fibrillation/atrial flutter The patient's CHA2DS2-VASc score is 4, indicating a 4.8% annual risk of stroke.   S/p afib and flutter ablation 06/2019 and repeat ablation 01/2021 Flecainide stopped post ablation 2/2 PR prolongation. We discussed rhythm control options today. Patient would like to pursue dofetilide admission. Continue Eliquis 5 mg BID,  states no missed doses in the last 3 weeks. No recent benadryl use PharmD to screen medications. Patient will stop hydrochlorothiazide and benadryl today.  QTc in SR 441 ms Check bmet/mag today Continue Toprol 50 mg daily with Lopressor 25 mg PRN for heart racing.   Secondary Hypercoagulable State (ICD10:  D68.69) The patient is at significant risk for stroke/thromboembolism based upon her CHA2DS2-VASc Score of 4.  Continue Apixaban (Eliquis).   Obesity Body mass index is 43 kg/m.  Encouraged lifestyle modification  OSA  Encouraged nightly CPAP Followed by Dr Mayford Knife  HTN Stable today. Stop hydrochlorothiazide as above.   CAD CAC score 648 on CT No anginal symptoms    Follow up in the AF clinic 2/18 for dofetilide loading.    Jorja Loa PA-C Afib Clinic [facility] [address] [place], [place] [address] [phone] 04/08/2023 9:00 AM

## 2023-04-11 ENCOUNTER — Telehealth: Payer: Self-pay | Admitting: Pharmacist

## 2023-04-11 NOTE — Telephone Encounter (Signed)
 Medication list reviewed in anticipation of upcoming Tikosyn  initiation. Patient is taking one contraindicated and no QTc prolonging medications.   Hydrochlorothiazide  is contraindicated with Tikosyn . Patient will need to stop hydrochlorothiazide  at least 3 days prior to admission.  Patient is anticoagulated on Eliquis  on the appropriate dose. Please ensure that patient has not missed any anticoagulation doses in the 3 weeks prior to Tikosyn  initiation.   Patient will need to be counseled to avoid use of Benadryl while on Tikosyn  and in the 2-3 days prior to Tikosyn  initiation.

## 2023-04-18 ENCOUNTER — Encounter: Payer: Self-pay | Admitting: Cardiovascular Disease

## 2023-04-19 ENCOUNTER — Encounter (HOSPITAL_COMMUNITY): Payer: Self-pay

## 2023-04-23 ENCOUNTER — Encounter (HOSPITAL_COMMUNITY): Payer: Self-pay | Admitting: *Deleted

## 2023-04-23 ENCOUNTER — Inpatient Hospital Stay (HOSPITAL_COMMUNITY)
Admission: AD | Admit: 2023-04-23 | Discharge: 2023-04-26 | DRG: 309 | Disposition: A | Discharge status: Home / Self Care | Payer: Medicare Other | Source: Ambulatory Visit | Attending: Cardiovascular Disease | Admitting: Cardiovascular Disease

## 2023-04-23 ENCOUNTER — Encounter (HOSPITAL_COMMUNITY): Payer: Self-pay | Admitting: Physician Assistant

## 2023-04-23 ENCOUNTER — Ambulatory Visit (HOSPITAL_COMMUNITY)
Admission: RE | Admit: 2023-04-23 | Discharge: 2023-04-23 | Disposition: A | Discharge status: Home / Self Care | Payer: Medicare Other | Source: Ambulatory Visit | Attending: Physician Assistant | Admitting: Physician Assistant

## 2023-04-23 ENCOUNTER — Telehealth (HOSPITAL_COMMUNITY): Payer: Self-pay

## 2023-04-23 ENCOUNTER — Other Ambulatory Visit (HOSPITAL_COMMUNITY): Payer: Self-pay

## 2023-04-23 VITALS — BP 118/70 | HR 74 | Ht 66.0 in | Wt 275.4 lb

## 2023-04-23 DIAGNOSIS — I4819 Other persistent atrial fibrillation: Secondary | ICD-10-CM | POA: Diagnosis not present

## 2023-04-23 DIAGNOSIS — G4733 Obstructive sleep apnea (adult) (pediatric): Secondary | ICD-10-CM | POA: Diagnosis not present

## 2023-04-23 DIAGNOSIS — I493 Ventricular premature depolarization: Secondary | ICD-10-CM | POA: Diagnosis present

## 2023-04-23 DIAGNOSIS — I2510 Atherosclerotic heart disease of native coronary artery without angina pectoris: Secondary | ICD-10-CM | POA: Diagnosis not present

## 2023-04-23 DIAGNOSIS — I4892 Unspecified atrial flutter: Secondary | ICD-10-CM | POA: Diagnosis present

## 2023-04-23 DIAGNOSIS — R0981 Nasal congestion: Secondary | ICD-10-CM | POA: Diagnosis not present

## 2023-04-23 DIAGNOSIS — D6869 Other thrombophilia: Secondary | ICD-10-CM | POA: Diagnosis present

## 2023-04-23 DIAGNOSIS — Z888 Allergy status to other drugs, medicaments and biological substances status: Secondary | ICD-10-CM

## 2023-04-23 DIAGNOSIS — I480 Paroxysmal atrial fibrillation: Secondary | ICD-10-CM | POA: Diagnosis not present

## 2023-04-23 DIAGNOSIS — Z7901 Long term (current) use of anticoagulants: Secondary | ICD-10-CM

## 2023-04-23 DIAGNOSIS — Z6841 Body Mass Index (BMI) 40.0 and over, adult: Secondary | ICD-10-CM | POA: Diagnosis not present

## 2023-04-23 DIAGNOSIS — I4891 Unspecified atrial fibrillation: Secondary | ICD-10-CM | POA: Diagnosis not present

## 2023-04-23 DIAGNOSIS — Z880 Allergy status to penicillin: Secondary | ICD-10-CM

## 2023-04-23 DIAGNOSIS — I081 Rheumatic disorders of both mitral and tricuspid valves: Secondary | ICD-10-CM | POA: Diagnosis not present

## 2023-04-23 DIAGNOSIS — I10 Essential (primary) hypertension: Secondary | ICD-10-CM | POA: Diagnosis not present

## 2023-04-23 DIAGNOSIS — Z91040 Latex allergy status: Secondary | ICD-10-CM

## 2023-04-23 DIAGNOSIS — E669 Obesity, unspecified: Secondary | ICD-10-CM | POA: Insufficient documentation

## 2023-04-23 DIAGNOSIS — I440 Atrioventricular block, first degree: Secondary | ICD-10-CM | POA: Diagnosis present

## 2023-04-23 DIAGNOSIS — Z79899 Other long term (current) drug therapy: Secondary | ICD-10-CM | POA: Diagnosis not present

## 2023-04-23 DIAGNOSIS — K5190 Ulcerative colitis, unspecified, without complications: Secondary | ICD-10-CM | POA: Insufficient documentation

## 2023-04-23 DIAGNOSIS — E876 Hypokalemia: Secondary | ICD-10-CM | POA: Diagnosis not present

## 2023-04-23 LAB — BASIC METABOLIC PANEL
Anion gap: 13 (ref 5–15)
BUN: 9 mg/dL (ref 8–23)
CO2: 23 mmol/L (ref 22–32)
Calcium: 9 mg/dL (ref 8.9–10.3)
Chloride: 103 mmol/L (ref 98–111)
Creatinine, Ser: 0.88 mg/dL (ref 0.44–1.00)
GFR, Estimated: >60 mL/min (ref >60)
Glucose, Bld: 99 mg/dL (ref 70–99)
Potassium: 4.2 mmol/L (ref 3.5–5.1)
Sodium: 139 mmol/L (ref 135–145)

## 2023-04-23 LAB — MAGNESIUM
Magnesium: 1.7 mg/dL (ref 1.7–2.4)
Magnesium: 2.3 mg/dL (ref 1.7–2.4)

## 2023-04-23 LAB — HIV ANTIBODY (ROUTINE TESTING W REFLEX): HIV Screen 4th Generation wRfx: NONREACTIVE

## 2023-04-23 MED ORDER — LOSARTAN POTASSIUM 50 MG PO TABS
100.0000 mg | ORAL_TABLET | Freq: Every day | ORAL | Status: DC
  Administered 2023-04-24 – 2023-04-26 (×3): 100 mg via ORAL
  Filled 2023-04-23 (×3): qty 2

## 2023-04-23 MED ORDER — ROSUVASTATIN CALCIUM 20 MG PO TABS
40.0000 mg | ORAL_TABLET | Freq: Every day | ORAL | Status: DC
  Administered 2023-04-24 – 2023-04-26 (×3): 40 mg via ORAL
  Filled 2023-04-23 (×3): qty 2

## 2023-04-23 MED ORDER — APIXABAN 5 MG PO TABS
5.0000 mg | ORAL_TABLET | Freq: Two times a day (BID) | ORAL | Status: DC
  Administered 2023-04-23 – 2023-04-26 (×6): 5 mg via ORAL
  Filled 2023-04-23 (×6): qty 1

## 2023-04-23 MED ORDER — METOPROLOL SUCCINATE ER 50 MG PO TB24
50.0000 mg | ORAL_TABLET | Freq: Every day | ORAL | Status: DC
  Administered 2023-04-24 – 2023-04-25 (×2): 50 mg via ORAL
  Filled 2023-04-23 (×3): qty 1

## 2023-04-23 MED ORDER — MAGNESIUM SULFATE 4 GM/100ML IV SOLN
4.0000 g | INTRAVENOUS | Status: AC
Start: 2023-04-23 — End: 2023-04-23
  Administered 2023-04-23: 4 g via INTRAVENOUS
  Filled 2023-04-23: qty 100

## 2023-04-23 MED ORDER — SODIUM CHLORIDE 0.9% FLUSH
3.0000 mL | Freq: Two times a day (BID) | INTRAVENOUS | Status: DC
  Administered 2023-04-24 – 2023-04-26 (×3): 3 mL via INTRAVENOUS

## 2023-04-23 MED ORDER — DOFETILIDE 500 MCG PO CAPS
500.0000 ug | ORAL_CAPSULE | Freq: Two times a day (BID) | ORAL | Status: DC
  Administered 2023-04-23 – 2023-04-24 (×2): 500 ug via ORAL
  Filled 2023-04-23 (×2): qty 1

## 2023-04-23 MED ORDER — ACETAMINOPHEN 500 MG PO TABS
1000.0000 mg | ORAL_TABLET | Freq: Four times a day (QID) | ORAL | Status: DC | PRN
  Administered 2023-04-23 – 2023-04-25 (×4): 1000 mg via ORAL
  Filled 2023-04-23 (×4): qty 2

## 2023-04-23 MED ORDER — MAGNESIUM OXIDE -MG SUPPLEMENT 400 (240 MG) MG PO TABS
400.0000 mg | ORAL_TABLET | Freq: Every day | ORAL | Status: DC
  Administered 2023-04-24 – 2023-04-26 (×3): 400 mg via ORAL
  Filled 2023-04-23 (×3): qty 1

## 2023-04-23 MED ORDER — SODIUM CHLORIDE 0.9% FLUSH: 3.0000 mL | INTRAVENOUS | Status: DC | PRN

## 2023-04-23 MED ORDER — ADULT MULTIVITAMIN W/MINERALS CH
1.0000 | ORAL_TABLET | Freq: Every day | ORAL | Status: DC
  Administered 2023-04-24 – 2023-04-26 (×3): 1 via ORAL
  Filled 2023-04-23 (×3): qty 1

## 2023-04-23 MED ORDER — SODIUM CHLORIDE 0.9 % IV SOLN: 250.0000 mL | INTRAVENOUS | Status: AC | PRN

## 2023-04-23 MED ORDER — CALCIUM POLYCARBOPHIL 625 MG PO TABS
625.0000 mg | ORAL_TABLET | Freq: Every day | ORAL | Status: DC
  Administered 2023-04-23 – 2023-04-25 (×3): 625 mg via ORAL
  Filled 2023-04-23 (×4): qty 1

## 2023-04-23 MED ORDER — EZETIMIBE 10 MG PO TABS
10.0000 mg | ORAL_TABLET | Freq: Every day | ORAL | Status: DC
  Administered 2023-04-24 – 2023-04-26 (×3): 10 mg via ORAL
  Filled 2023-04-23 (×3): qty 1

## 2023-04-23 NOTE — Telephone Encounter (Signed)
 Spoke with patient, she was wanting to ensure that Dr Nelly Laurence is aware she is to be starting Tikosyn. Reassured her that Dr Nelly Laurence has also reviewed her recent office visit notes at the AF clinic. Patient thankful for the returned call and will proceed with the tikosyn loading.    Mealor, Roberts Gaudy, MD to Serita Sheller R, LPN  Me  AM   0/86/57  8:57 PM I looked back at the recent note from [place] -- they talked about starting Tikosyn in clinic. I am happy to see the patient in clinic if there are any questions.

## 2023-04-23 NOTE — Telephone Encounter (Signed)
 Pharmacy Patient Advocate Encounter  Insurance verification completed.    The patient is insured through  [employer] .     Ran test claim for [facility] and the current 30 day co-pay is $35.94.   This test claim was processed through [facility]- copay amounts may vary at other pharmacies due to pharmacy/plan contracts, or as the patient moves through the different stages of their insurance plan.

## 2023-04-23 NOTE — H&P (Signed)
 Primary Care Physician: Merri Brunette, MD Primary Cardiologist: Dr Duke Salvia  Primary Electrophysiologist: Dr Nelly Laurence  Referring Physician: Dr Lavon Paganini is a 68 y.o. female with a history of atrial flutter, paroxysmal atrial fibrillation, OSA, HTN, and ulcerative colitis who presents for follow up in the [facility].  The patient was initially diagnosed with atrial flutter in 2012 and subsequently atrial fibrillation. She was maintaining on flecainide but had increasing episodes of afib with symptoms of SOB, fatigue, and palpitations. Patient is on Eliquis for a CHADS2VASC score of 3. She underwent afib and flutter ablation with Dr Johney Frame on 06/30/19. Unfortunately, she continued to have symptomatic afib and had repeat ablation (both PVI and CTI) on 01/17/21.   Patient returns for follow up for atrial fibrillation. At her last visit, she reported that she had much more frequent afib episodes. She presents today for dofetilide admission. She denies any missed doses of anticoagulation. She continues to have daily symptoms of tachypalpitations and fatigue with exertion. Currently in SR.   Today, he denies symptoms of chest pain, shortness of breath, orthopnea, PND, lower extremity edema, dizziness, presyncope, syncope, bleeding, or neurologic sequela. The patient is tolerating medications without difficulties and is otherwise without complaint today.    Atrial Fibrillation Risk Factors:  she does have symptoms or diagnosis of sleep apnea.   Atrial Fibrillation Management history:  Previous antiarrhythmic drugs: flecainide Previous cardioversions: none Previous ablations: 06/30/19 afib and flutter, 01/17/21 afib and flutter Anticoagulation history: Eliquis   Past Medical History:  Diagnosis Date   Arthritis    Atrial flutter (HCC) 01/05/2013   CAD (coronary artery disease) 07/01/2019   Encounter for monitoring flecainide therapy 01/11/2011    Hypertension, essential, benign 01/11/2011   OSA on CPAP 01/05/2013   Overweight    Paroxysmal atrial fibrillation (HCC)    Ulcerative colitis (HCC)     Current Facility-Administered Medications  Medication Dose Route Frequency Provider Last Rate Last Admin   0.9 %  sodium chloride infusion  250 mL Intravenous PRN Mealor, Roberts Gaudy, MD       acetaminophen (TYLENOL) tablet 1,000 mg  1,000 mg Oral Q6H PRN Fenton, Clint R, PA       apixaban (ELIQUIS) tablet 5 mg  5 mg Oral BID Mealor, Roberts Gaudy, MD       dofetilide (TIKOSYN) capsule 500 mcg  500 mcg Oral BID Mealor, Roberts Gaudy, MD       [START ON 04/24/2023] ezetimibe (ZETIA) tablet 10 mg  10 mg Oral Daily Fenton, Clint R, PA       Fiber CAPS 500 mg  500 mg Oral QHS Fenton, Clint R, PA       [START ON 04/24/2023] losartan (COZAAR) tablet 100 mg  100 mg Oral Daily Fenton, Clint R, PA       [START ON 04/24/2023] magnesium oxide (MAG-OX) tablet 400 mg  400 mg Oral Daily Fenton, Clint R, PA       magnesium sulfate IVPB 4 g 100 mL  4 g Intravenous STAT [place], [place]       [START ON 04/24/2023] metoprolol succinate (TOPROL-XL) 24 hr tablet 50 mg  50 mg Oral Daily Fenton, Clint R, PA       [START ON 04/24/2023] multivitamin with minerals tablet 1 tablet  1 tablet Oral Daily Fenton, Clint R, PA       [START ON 04/24/2023] rosuvastatin (CRESTOR) tablet 40 mg  40 mg Oral Daily Fenton,  Clint R, PA       sodium chloride flush (NS) 0.9 % injection 3 mL  3 mL Intravenous Q12H Mealor, Roberts Gaudy, MD       sodium chloride flush (NS) 0.9 % injection 3 mL  3 mL Intravenous PRN Mealor, Roberts Gaudy, MD        ROS- All systems are reviewed and negative except as per the HPI above.  Physical Exam: Vitals:   04/23/23 1453  BP: 113/67  Pulse: 75  Temp: 97.6 F (36.4 C)  TempSrc: Oral  SpO2: 98%  Weight: 125.8 kg  Height: 5\' 5"  (1.651 m)     GEN: Well nourished, well developed in no acute distress NECK: No JVD; No carotid bruits CARDIAC: Regular rate  and rhythm, no murmurs, rubs, gallops RESPIRATORY:  Clear to auscultation without rales, wheezing or rhonchi  ABDOMEN: Soft, non-tender, non-distended EXTREMITIES:  No edema; No deformity    Wt Readings from Last 3 Encounters:  04/23/23 125.8 kg  04/23/23 124.9 kg  04/08/23 120.8 kg    EKG today demonstrates  SR, 1st degree AV block Vent. rate 74 BPM PR interval 228 ms QRS duration 78 ms QT/QTcB 414/459 ms (444 ms measured manually)   Echo 10/12/20 demonstrated  1. Left ventricular ejection fraction, by estimation, is 60 to 65%. Left  ventricular ejection fraction by 3D volume is 64 %. The left ventricle has normal function. The left ventricle has no regional wall motion  abnormalities. Left ventricular diastolic parameters are consistent with Grade II diastolic dysfunction (pseudonormalization). Elevated left atrial pressure.   2. Right ventricular systolic function is normal. The right ventricular  size is normal.   3. Left atrial size was mildly dilated.   4. The mitral valve is normal in structure. Mild to moderate mitral valve  regurgitation. No evidence of mitral stenosis.   5. Tricuspid valve regurgitation is mild to moderate.   6. The aortic valve is tricuspid. Aortic valve regurgitation is not  visualized. Mild aortic valve sclerosis is present, with no evidence of  aortic valve stenosis.   7. The inferior vena cava is normal in size with greater than 50%  respiratory variability, suggesting right atrial pressure of 3 mmHg.   Epic records are reviewed at length today  CHA2DS2-VASc Score = 4  The patient's score is based upon: CHF History: 0 HTN History: 1 Diabetes History: 0 Stroke History: 0 Vascular Disease History: 1 Age Score: 1 Gender Score: 1       ASSESSMENT AND PLAN: Paroxysmal Atrial Fibrillation/atrial flutter The patient's CHA2DS2-VASc score is 4, indicating a 4.8% annual risk of stroke.   S/p afib and flutter ablation 06/2019 and repeat ablation  01/2021 Patient presents for dofetilide admission. Continue Eliquis 5 mg BID, states no missed doses in the last 3 weeks. No recent benadryl use PharmD has screened medications for QT prolonging agents. Patient has stopped hydrochlorothiazide. QTc in SR 440-450 ms Labs today show creatinine at 0.88, K+ 4.2 and mag 1.7, CrCl calculated at 120 mL/min Continue Toprol 50 mg daily with Lopressor 25 mg PRN for heart racing.  Secondary Hypercoagulable State (ICD10:  D68.69) The patient is at significant risk for stroke/thromboembolism based upon her CHA2DS2-VASc Score of 4.  Continue Apixaban (Eliquis). No bleeding issues.  Obesity Body mass index is 46.15 kg/m.  Encouraged lifestyle modification  OSA  Encouraged nightly CPAP Followed by Dr Mayford Knife  HTN Stable on current regimen  CAD CAC score 648 on CT No anginal symptoms Followed by  Dr Duke Salvia    To be admitted later today once a bed becomes available.    Jorja Loa PA-C Afib Clinic [facility] [address] [place], [place] [address] [phone] 04/23/2023 3:42 PM  ____________________________________________________  Admit for High Risk Drug Monitoring: Tikosyn Loading   Hx of Paroxysmal AF with CHA2DS2-VASc 4 on Eliquis.  Hx of AF/AFL ablation 06/2019, re-do in 01/2021.  Admit K+ 4.2, Mg+ 1.7, Cr 0.88 with CrCl of 24mL/min.  No missed doses of Eliquis.  Medications reviewed by pharmacy prior to admit.  EKG 2/18 with QTc in V5.     Canary Brim, NP-C, AGACNP-BC [facility] - Electrophysiology  04/23/2023, 3:48 PM

## 2023-04-23 NOTE — Progress Notes (Addendum)
 Pharmacy: Dofetilide (Tikosyn) - Initial Consult Assessment and Electrolyte Replacement  Pharmacy consulted to assist in monitoring and replacing electrolytes in this 68 y.o. female admitted on 04/23/2023 undergoing dofetilide initiation. First dofetilide dose: 2/18 pm  Assessment:  Patient Exclusion Criteria: If any screening criteria checked as "Yes", then  patient  should NOT receive dofetilide until criteria item is corrected.  If "Yes" please indicate correction plan.  YES  NO Patient  Exclusion Criteria Correction Plan   [x]   []   Baseline QTc interval is greater than or equal to 440 msec. IF above YES box checked dofetilide contraindicated unless patient has ICD; then may proceed if QTc 500-550 msec or with known ventricular conduction abnormalities may proceed with QTc 550-600 msec. QTc = 459 on 2/18 EKG(MD aware); per MD - QTc in SR 440-450 ms  Monitor closely prior to each dose   []   [x]   Patient is known or suspected to have a digoxin level greater than 2 ng/ml: No results found for: "DIGOXIN"     []   [x]   Creatinine clearance less than 20 ml/min (calculated using Cockcroft-Gault, actual body weight and serum creatinine): Estimated Creatinine Clearance: 81.6 mL/min (by C-G formula based on SCr of 0.88 mg/dL).     []   [x]  Patient has received drugs known to prolong the QT intervals within the last 48 hours (phenothiazines, tricyclics or tetracyclic antidepressants, erythromycin, H-1 antihistamines, cisapride, fluoroquinolones, azithromycin, ondansetron).   Updated information on QT prolonging agents is available to be searched on the following database:QT prolonging agents     []   [x]  Patient received a dose of a thiazide diuretic in the last 48 hours [including hydrochlorothiazide (Oretic) alone or in any combination including triamterene (Dyazide, Maxzide)].    []   [x]  Patient received a medication known to increase dofetilide plasma concentrations prior to initial  dofetilide dose:  Trimethoprim (Primsol, Proloprim) in the last 36 hours Verapamil (Calan, Verelan) in the last 36 hours or a sustained release dose in the last 72 hours Megestrol (Megace) in the last 5 days  Cimetidine (Tagamet) in the last 6 hours Ketoconazole (Nizoral) in the last 24 hours Itraconazole (Sporanox) in the last 48 hours  Prochlorperazine (Compazine) in the last 36 hours     []   [x]   Patient is known to have a history of torsades de pointes; congenital or acquired long QT syndromes.    []   [x]   Patient has received a Class 1 antiarrhythmic with less than 2 half-lives since last dose. (Disopyramide, Quinidine, Procainamide, Lidocaine, Mexiletine, Flecainide, Propafenone)    []   [x]   Patient has received amiodarone therapy in the past 3 months or amiodarone level is greater than 0.3 ng/ml.    Labs:    Component Value Date/Time   K 4.2 04/23/2023 1021   MG 1.7 04/23/2023 1021     Plan: Select One Calculated CrCl  Dose q12h  [x]  > 60 ml/min 500 mcg  []  40-60 ml/min 250 mcg  []  20-40 ml/min 125 mcg   [x]   Physician selected initial dose within range recommended for patients level of renal function - will monitor for response.  []   Physician selected initial dose outside of range recommended for patients level of renal function - will discuss if the dose should be altered at this time.   Patient has been appropriately anticoagulated with apixaban.  Potassium: K >/= 4: Appropriate to initiate Tikosyn, no replacement needed    Magnesium: Mg 1.3-1.7: Hold Tikosyn initiation and give Mg 4 gm x1  IV, recheck Mg 4hr after end of infusion - repeat appropriate dose if not > 1.8     Christoper Fabian, PharmD, BCPS Please see amion for complete clinical pharmacist phone list 04/23/2023  3:14 PM   ADDENDUM (2230) Magnesium 2.3 after replacement. Okay to go ahead with Tikosyn initiation.  Christoper Fabian, PharmD, BCPS Please see amion for complete clinical pharmacist phone  list 04/23/2023 10:30 PM

## 2023-04-23 NOTE — Progress Notes (Signed)
   04/23/23 2150  BiPAP/CPAP/SIPAP  $ Non-Invasive Ventilator  Non-Invasive Vent Initial  $ Face Mask Small Yes  BiPAP/CPAP/SIPAP Pt Type Adult  BiPAP/CPAP/SIPAP Resmed  Mask Type Full face mask  Dentures removed? Not applicable  PEEP 12 cmH20  FiO2 (%) 21 %  Patient Home Equipment No  Auto Titrate No

## 2023-04-23 NOTE — Progress Notes (Signed)
 Primary Care Physician: Merri Brunette, MD Primary Cardiologist: Dr Duke Salvia  Primary Electrophysiologist: Dr Nelly Laurence  Referring Physician: Dr Lavon Paganini is a 68 y.o. female with a history of atrial flutter, paroxysmal atrial fibrillation, OSA, HTN, and ulcerative colitis who presents for follow up in the [facility].  The patient was initially diagnosed with atrial flutter in 2012 and subsequently atrial fibrillation. She was maintaining on flecainide but had increasing episodes of afib with symptoms of SOB, fatigue, and palpitations. Patient is on Eliquis for a CHADS2VASC score of 3. She underwent afib and flutter ablation with Dr Johney Frame on 06/30/19. Unfortunately, she continued to have symptomatic afib and had repeat ablation (both PVI and CTI) on 01/17/21.   Patient returns for follow up for atrial fibrillation. At her last visit, she reported that she had much more frequent afib episodes. She presents today for dofetilide admission. She denies any missed doses of anticoagulation. She continues to have daily symptoms of tachypalpitations and fatigue with exertion. Currently in SR.   Today, he denies symptoms of chest pain, shortness of breath, orthopnea, PND, lower extremity edema, dizziness, presyncope, syncope, bleeding, or neurologic sequela. The patient is tolerating medications without difficulties and is otherwise without complaint today.    Atrial Fibrillation Risk Factors:  she does have symptoms or diagnosis of sleep apnea.   Atrial Fibrillation Management history:  Previous antiarrhythmic drugs: flecainide Previous cardioversions: none Previous ablations: 06/30/19 afib and flutter, 01/17/21 afib and flutter Anticoagulation history: Eliquis   Past Medical History:  Diagnosis Date   Arthritis    Atrial flutter (HCC) 01/05/2013   CAD (coronary artery disease) 07/01/2019   Encounter for monitoring flecainide therapy 01/11/2011    Hypertension, essential, benign 01/11/2011   OSA on CPAP 01/05/2013   Overweight    Paroxysmal atrial fibrillation (HCC)    Ulcerative colitis (HCC)     Current Outpatient Medications  Medication Sig Dispense Refill   acetaminophen (TYLENOL) 500 MG tablet Take 1,000 mg by mouth every 6 (six) hours as needed for moderate pain.     acyclovir (ZOVIRAX) 200 MG capsule Take 200 mg by mouth as needed.     apixaban (ELIQUIS) 5 MG TABS tablet Take 1 tablet (5 mg total) by mouth 2 (two) times daily. 60 tablet 6   ezetimibe (ZETIA) 10 MG tablet TAKE 1 TABLET BY MOUTH EVERY DAY 90 tablet 3   FIBER PO Take 4 capsules by mouth at bedtime.     losartan (COZAAR) 100 MG tablet TAKE 1 TABLET BY MOUTH EVERY DAY 90 tablet 2   magnesium oxide (MAG-OX) 400 (240 Mg) MG tablet Take 1 tablet (400 mg total) by mouth daily. 90 tablet 1   Mesalamine 800 MG TBEC Take 800 mg by mouth daily.      metoprolol succinate (TOPROL-XL) 50 MG 24 hr tablet TAKE 1 TABLET BY MOUTH EVERY DAY. TAKE IMMEDIATELY FOLLOWING A MEAL. 90 tablet 3   metoprolol tartrate (LOPRESSOR) 25 MG tablet Take 1 tablet (25 mg total) by mouth as needed. 45 tablet 3   Multiple Vitamin (MULTIVITAMIN WITH MINERALS) TABS tablet Take 1 tablet by mouth daily. Centrum Silver     rosuvastatin (CRESTOR) 40 MG tablet TAKE 1 TABLET BY MOUTH EVERY DAY 90 tablet 3   No current facility-administered medications for this encounter.    ROS- All systems are reviewed and negative except as per the HPI above.  Physical Exam: Vitals:   04/23/23 1035  BP: 118/70  Pulse: 74  SpO2: 95%  Weight: 124.9 kg  Height: 5\' 6"  (1.676 m)     GEN: Well nourished, well developed in no acute distress NECK: No JVD; No carotid bruits CARDIAC: Regular rate and rhythm, no murmurs, rubs, gallops RESPIRATORY:  Clear to auscultation without rales, wheezing or rhonchi  ABDOMEN: Soft, non-tender, non-distended EXTREMITIES:  No edema; No deformity    Wt Readings from Last 3  Encounters:  04/23/23 124.9 kg  04/08/23 120.8 kg  03/22/23 121.6 kg    EKG today demonstrates  SR, 1st degree AV block Vent. rate 74 BPM PR interval 228 ms QRS duration 78 ms QT/QTcB 414/459 ms (444 ms measured manually)   Echo 10/12/20 demonstrated  1. Left ventricular ejection fraction, by estimation, is 60 to 65%. Left  ventricular ejection fraction by 3D volume is 64 %. The left ventricle has normal function. The left ventricle has no regional wall motion  abnormalities. Left ventricular diastolic parameters are consistent with Grade II diastolic dysfunction (pseudonormalization). Elevated left atrial pressure.   2. Right ventricular systolic function is normal. The right ventricular  size is normal.   3. Left atrial size was mildly dilated.   4. The mitral valve is normal in structure. Mild to moderate mitral valve  regurgitation. No evidence of mitral stenosis.   5. Tricuspid valve regurgitation is mild to moderate.   6. The aortic valve is tricuspid. Aortic valve regurgitation is not  visualized. Mild aortic valve sclerosis is present, with no evidence of  aortic valve stenosis.   7. The inferior vena cava is normal in size with greater than 50%  respiratory variability, suggesting right atrial pressure of 3 mmHg.   Epic records are reviewed at length today  CHA2DS2-VASc Score = 4  The patient's score is based upon: CHF History: 0 HTN History: 1 Diabetes History: 0 Stroke History: 0 Vascular Disease History: 1 Age Score: 1 Gender Score: 1       ASSESSMENT AND PLAN: Paroxysmal Atrial Fibrillation/atrial flutter The patient's CHA2DS2-VASc score is 4, indicating a 4.8% annual risk of stroke.   S/p afib and flutter ablation 06/2019 and repeat ablation 01/2021 Patient presents for dofetilide admission. Continue Eliquis 5 mg BID, states no missed doses in the last 3 weeks. No recent benadryl use PharmD has screened medications for QT prolonging agents. Patient has  stopped hydrochlorothiazide. QTc in SR 440-450 ms Labs today show creatinine at 0.88, K+ 4.2 and mag 1.7, CrCl calculated at 120 mL/min Continue Toprol 50 mg daily with Lopressor 25 mg PRN for heart racing.  Secondary Hypercoagulable State (ICD10:  D68.69) The patient is at significant risk for stroke/thromboembolism based upon her CHA2DS2-VASc Score of 4.  Continue Apixaban (Eliquis). No bleeding issues.  Obesity Body mass index is 44.45 kg/m.  Encouraged lifestyle modification  OSA  Encouraged nightly CPAP Followed by Dr Mayford Knife  HTN Stable on current regimen  CAD CAC score 648 on CT No anginal symptoms Followed by Dr Duke Salvia    To be admitted later today once a bed becomes available.    Jorja Loa PA-C [facility] [address] [place], [place] [address] [phone] 04/23/2023 10:57 AM

## 2023-04-24 ENCOUNTER — Other Ambulatory Visit: Payer: Self-pay

## 2023-04-24 DIAGNOSIS — I480 Paroxysmal atrial fibrillation: Secondary | ICD-10-CM | POA: Diagnosis not present

## 2023-04-24 LAB — BASIC METABOLIC PANEL
Anion gap: 11 (ref 5–15)
BUN: 9 mg/dL (ref 8–23)
CO2: 23 mmol/L (ref 22–32)
Calcium: 8.8 mg/dL — ABNORMAL LOW (ref 8.9–10.3)
Chloride: 103 mmol/L (ref 98–111)
Creatinine, Ser: 0.79 mg/dL (ref 0.44–1.00)
GFR, Estimated: >60 mL/min (ref >60)
Glucose, Bld: 92 mg/dL (ref 70–99)
Potassium: 3.4 mmol/L — ABNORMAL LOW (ref 3.5–5.1)
Sodium: 137 mmol/L (ref 135–145)

## 2023-04-24 LAB — MAGNESIUM: Magnesium: 1.9 mg/dL (ref 1.7–2.4)

## 2023-04-24 MED ORDER — LORATADINE 10 MG PO TABS
10.0000 mg | ORAL_TABLET | Freq: Every day | ORAL | Status: DC
  Administered 2023-04-24 – 2023-04-26 (×3): 10 mg via ORAL
  Filled 2023-04-24 (×3): qty 1

## 2023-04-24 MED ORDER — MAGNESIUM SULFATE 2 GM/50ML IV SOLN
2.0000 g | Freq: Once | INTRAVENOUS | Status: AC
  Administered 2023-04-24: 2 g via INTRAVENOUS
  Filled 2023-04-24: qty 50

## 2023-04-24 MED ORDER — TRIAMCINOLONE ACETONIDE 55 MCG/ACT NA AERO
2.0000 | INHALATION_SPRAY | Freq: Every day | NASAL | Status: DC
  Administered 2023-04-24: 2 via NASAL
  Filled 2023-04-24: qty 21.6

## 2023-04-24 MED ORDER — POTASSIUM CHLORIDE CRYS ER 20 MEQ PO TBCR
40.0000 meq | EXTENDED_RELEASE_TABLET | ORAL | Status: AC
  Administered 2023-04-24 (×2): 40 meq via ORAL
  Filled 2023-04-24 (×2): qty 2

## 2023-04-24 MED ORDER — DOFETILIDE 250 MCG PO CAPS
250.0000 ug | ORAL_CAPSULE | Freq: Two times a day (BID) | ORAL | Status: DC
  Administered 2023-04-24 – 2023-04-25 (×2): 250 ug via ORAL
  Filled 2023-04-24 (×2): qty 1

## 2023-04-24 MED ORDER — SALINE SPRAY 0.65 % NA SOLN
1.0000 | NASAL | Status: DC | PRN
  Administered 2023-04-25: 1 via NASAL
  Filled 2023-04-24: qty 44

## 2023-04-24 MED ORDER — MESALAMINE 400 MG PO CPDR
800.0000 mg | DELAYED_RELEASE_CAPSULE | Freq: Every day | ORAL | Status: DC
  Administered 2023-04-25 – 2023-04-26 (×2): 800 mg via ORAL
  Filled 2023-04-24 (×2): qty 2

## 2023-04-24 NOTE — Progress Notes (Signed)
 Electrophysiology Rounding Note  Patient Name: Catherine Callahan Date of Encounter: 04/24/2023  Primary Cardiologist: Lesleigh Noe, MD (Inactive)  Electrophysiologist: Maurice Small, MD    Subjective   Pt remains in NSR on Tikosyn 250 mcg BID   QTc from EKG last pm shows stable QTc at 461 ms (QT )  The patient reports nasal congestion this am.  Asking for a "decongestant".  At this time, the patient denies chest pain, shortness of breath, or any new concerns.  Inpatient Medications    Scheduled Meds:  apixaban  5 mg Oral BID   dofetilide  500 mcg Oral BID   ezetimibe  10 mg Oral Daily   loratadine  10 mg Oral Daily   losartan  100 mg Oral Daily   magnesium oxide  400 mg Oral Daily   metoprolol succinate  50 mg Oral Daily   multivitamin with minerals  1 tablet Oral Daily   polycarbophil  625 mg Oral QHS   potassium chloride  40 mEq Oral Q4H   rosuvastatin  40 mg Oral Daily   sodium chloride flush  3 mL Intravenous Q12H   triamcinolone  2 spray Each Nare Daily   Continuous Infusions:  sodium chloride     magnesium sulfate bolus IVPB     PRN Meds: sodium chloride, acetaminophen, sodium chloride, sodium chloride flush   Vital Signs    Vitals:   04/23/23 1453 04/23/23 1954 04/24/23 0517  BP: 113/67 126/75 131/78  Pulse: 75 77   Resp:  20   Temp: 97.6 F (36.4 C) 98 F (36.7 C) 97.8 F (36.6 C)  TempSrc: Oral Oral Oral  SpO2: 98% 99% 97%  Weight: 125.8 kg    Height: 5\' 5"  (1.651 m)      Intake/Output Summary (Last 24 hours) at 04/24/2023 0755 Last data filed at 04/24/2023 0500 Gross per 24 hour  Intake 480 ml  Output --  Net 480 ml   Filed Weights   04/23/23 1453  Weight: 125.8 kg    Physical Exam    GEN- NAD, A&O x 3. Normal affect.  Lungs- CTAB, Normal effort.  Heart- Regular rate and rhythm. No M/G/R GI- Soft, NT, ND Extremities- No clubbing, cyanosis, or edema Skin- no rash or lesion  Labs    CBC No results for input(s):  "WBC", "NEUTROABS", "HGB", "HCT", "MCV", "PLT" in the last 72 hours. Basic Metabolic Panel Recent Labs    95/62/13 1021 04/23/23 2146 04/24/23 0406  NA 139  --  137  K 4.2  --  3.4*  CL 103  --  103  CO2 23  --  23  GLUCOSE 99  --  92  BUN 9  --  9  CREATININE 0.88  --  0.79  CALCIUM 9.0  --  8.8*  MG 1.7 2.3 1.9    Telemetry    SB 50-SR 60's with 1AVB, occ non-conducted beats (personally reviewed)  Patient Profile     Catherine Callahan is a 68 y.o. female with a past medical history significant for persistent atrial fibrillation.  They were admitted for tikosyn load.   Assessment & Plan    Persistent Atrial Fibrillation Pt remains in NSR on Tikosyn 250 mcg BID Note pt has prominent U-wave on EKG Continue Eliquis Creatinine, ser  0.79 (02/19 0406) Magnesium  1.9 (02/19 0406) Potassium3.4* (02/19 0406) Supplement both K and Mg per protocol  Plan for home Friday if QTc remains stable.  For questions or updates, please contact [facility] Please consult [email or web address] for contact info under Cardiology/STEMI.  Signed, Canary Brim, NP-C, AGACNP-BC [facility] - Electrophysiology  04/24/2023, 7:59 AM

## 2023-04-24 NOTE — TOC CM/SW Note (Signed)
 Transition of Care [facility]) - Inpatient Brief Assessment   Patient Details  Name: ESSIE LAGUNES MRN: [id number] Date of Birth: 1955/12/25  Transition of Care [facility]) CM/SW Contact:    Gala Lewandowsky, RN Phone Number: 04/24/2023, 8:59 AM   Clinical Narrative: Patient presented for Tikosyn Load. Case Manager spoke with the patient regarding co pay cost. Patient is unsure of utilizing the insurance co pay vs using Good [facility]. Patient  would like to have the initial Rx filled via [facility] and the Rx refills 90 day supply escribed to [facility]. No further needs identified at this time.   Transition of Care Asessment: Insurance and Status: Insurance coverage has been reviewed Patient has primary care physician: Yes Home environment has been reviewed: reviewed. Prior level of function:: independent Prior/Current Home Services: No current home services Social Drivers of Health Review: SDOH reviewed no interventions necessary Readmission risk has been reviewed: Yes Transition of care needs: no transition of care needs at this time

## 2023-04-24 NOTE — Progress Notes (Signed)
 Pharmacy: Dofetilide (Tikosyn) - Follow Up Assessment and Electrolyte Replacement  Pharmacy consulted to assist in monitoring and replacing electrolytes in this 68 y.o. female admitted on 04/23/2023 undergoing dofetilide initiation. First dofetilide dose: 2/18@2234 .   Labs:    Component Value Date/Time   K 3.4 (L) 04/24/2023 0406   MG 1.9 04/24/2023 0406     Plan: Potassium: K < 3.5:  Give KCl 40 mEq q4hr x2   Magnesium: Mg 1.8-2: Give Mg 2 gm IV x1   Thank you for allowing pharmacy to participate in this patient's care,  Sherron Monday, PharmD, BCCCP Clinical Pharmacist  Phone: [phone] 04/24/2023 7:24 AM  Please check AMION for all [facility] Pharmacy phone numbers After 10:00 PM, call Main Pharmacy [phone]

## 2023-04-25 ENCOUNTER — Inpatient Hospital Stay (HOSPITAL_COMMUNITY): Payer: Medicare Other

## 2023-04-25 DIAGNOSIS — I480 Paroxysmal atrial fibrillation: Secondary | ICD-10-CM | POA: Diagnosis not present

## 2023-04-25 DIAGNOSIS — I4891 Unspecified atrial fibrillation: Secondary | ICD-10-CM | POA: Diagnosis not present

## 2023-04-25 LAB — BASIC METABOLIC PANEL
Anion gap: 11 (ref 5–15)
BUN: 11 mg/dL (ref 8–23)
CO2: 21 mmol/L — ABNORMAL LOW (ref 22–32)
Calcium: 8.9 mg/dL (ref 8.9–10.3)
Chloride: 105 mmol/L (ref 98–111)
Creatinine, Ser: 0.91 mg/dL (ref 0.44–1.00)
GFR, Estimated: >60 mL/min (ref >60)
Glucose, Bld: 84 mg/dL (ref 70–99)
Potassium: 4 mmol/L (ref 3.5–5.1)
Sodium: 137 mmol/L (ref 135–145)

## 2023-04-25 LAB — ECHOCARDIOGRAM COMPLETE
AR max vel: 1.72 cm2
AV Area VTI: 1.72 cm2
AV Area mean vel: 1.69 cm2
AV Mean grad: 4 mm[Hg]
AV Peak grad: 7 mm[Hg]
Ao pk vel: 1.32 m/s
Area-P 1/2: 4.71 cm2
Calc EF: 67.9 %
Height: 65 in
S' Lateral: 4.2 cm
Single Plane A2C EF: 70 %
Single Plane A4C EF: 65.4 %
Weight: 4436.8 [oz_av]

## 2023-04-25 LAB — MAGNESIUM: Magnesium: 1.9 mg/dL (ref 1.7–2.4)

## 2023-04-25 MED ORDER — POTASSIUM CHLORIDE CRYS ER 20 MEQ PO TBCR
40.0000 meq | EXTENDED_RELEASE_TABLET | Freq: Once | ORAL | Status: AC
  Administered 2023-04-25: 40 meq via ORAL
  Filled 2023-04-25: qty 2

## 2023-04-25 MED ORDER — FUROSEMIDE 10 MG/ML IJ SOLN
20.0000 mg | Freq: Once | INTRAMUSCULAR | Status: AC
  Administered 2023-04-25: 20 mg via INTRAVENOUS
  Filled 2023-04-25: qty 2

## 2023-04-25 MED ORDER — DOFETILIDE 125 MCG PO CAPS
125.0000 ug | ORAL_CAPSULE | Freq: Two times a day (BID) | ORAL | Status: DC
  Administered 2023-04-25 – 2023-04-26 (×2): 125 ug via ORAL
  Filled 2023-04-25 (×3): qty 1

## 2023-04-25 MED ORDER — PERFLUTREN LIPID MICROSPHERE
1.0000 mL | INTRAVENOUS | Status: AC | PRN
  Administered 2023-04-25: 3 mL via INTRAVENOUS

## 2023-04-25 MED ORDER — MAGNESIUM SULFATE 2 GM/50ML IV SOLN
2.0000 g | Freq: Once | INTRAVENOUS | Status: AC
  Administered 2023-04-25: 2 g via INTRAVENOUS
  Filled 2023-04-25: qty 50

## 2023-04-25 NOTE — Progress Notes (Signed)
 Pharmacy: Dofetilide (Tikosyn) - Follow Up Assessment and Electrolyte Replacement  Pharmacy consulted to assist in monitoring and replacing electrolytes in this 68 y.o. female admitted on 04/23/2023 undergoing dofetilide initiation. First dofetilide dose: 2/18@2234 .   Labs:    Component Value Date/Time   K 4.0 04/25/2023 0331   MG 1.9 04/25/2023 0331     Plan: Potassium: K >/= 4: No additional supplementation needed  Magnesium: Mg 1.8-2: Give Mg 2 gm IV x1   Thank you for allowing pharmacy to participate in this patient's care,  Sherron Monday, PharmD, BCCCP Clinical Pharmacist  Phone: [phone] 04/25/2023 7:13 AM  Please check AMION for all [facility] Pharm[facility] phone numbers After 10:00 PM, call Main Pharmacy [phone]

## 2023-04-25 NOTE — Progress Notes (Signed)
 Mobility Specialist Progress Note;   04/25/23 1405  Mobility  Activity Ambulated independently in hallway  Level of Assistance Modified independent, requires aide device or extra time  Assistive Device None  Distance Ambulated (ft) 400 ft  Activity Response Tolerated well  Mobility Referral Yes  Mobility visit 1 Mobility  Mobility Specialist Start Time (ACUTE ONLY) 1405  Mobility Specialist Stop Time (ACUTE ONLY) 1413  Mobility Specialist Time Calculation (min) (ACUTE ONLY) 8 min   Pt eager for mobility. Required no physical assistance during ambulation, ModI. No c/o of SOB when asked post-mobility. HR up to 105 w/ activity. Pt returned back to chair with all needs met.   Caesar Bookman Mobility Specialist Please contact via SecureChat or [employer] [phone]

## 2023-04-25 NOTE — Progress Notes (Signed)
 Morning EKG reviewed     Shows remains in NSR with prolonged QTc at 530 ms (manual in V6)  Reduce to Tikosyn 125 mcg BID.   Potassium4.0 (02/20 0331) Magnesium  1.9 (02/20 0331) Creatinine, ser  0.91 (02/20 0331)  Plan for home Friday if QTc remains stable     Canary Brim, NP-C, AGACNP-BC [facility] - Electrophysiology  04/25/2023, 4:04 PM

## 2023-04-25 NOTE — Progress Notes (Signed)
 Electrophysiology Rounding Note  Patient Name: Catherine Callahan Date of Encounter: 04/25/2023  Primary Cardiologist: Lesleigh Noe, MD (Inactive)  Electrophysiologist: Maurice Small, MD    Subjective   Pt remains in NSR on Tikosyn 250 mcg BID. Did have episode of paroxysmal AF overnight, converted back to NSR spontaneously   QTc from EKG last pm shows stable QTc at 485 ms in lead II (manual measurement)  The patient is doing well today.  At this time, the patient denies chest pain, shortness of breath, or any new concerns.  Inpatient Medications    Scheduled Meds:  apixaban  5 mg Oral BID   dofetilide  250 mcg Oral BID   ezetimibe  10 mg Oral Daily   furosemide  20 mg Intravenous Once   loratadine  10 mg Oral Daily   losartan  100 mg Oral Daily   magnesium oxide  400 mg Oral Daily   Mesalamine  800 mg Oral Daily   metoprolol succinate  50 mg Oral Daily   multivitamin with minerals  1 tablet Oral Daily   polycarbophil  625 mg Oral QHS   potassium chloride  40 mEq Oral Once   rosuvastatin  40 mg Oral Daily   sodium chloride flush  3 mL Intravenous Q12H   triamcinolone  2 spray Each Nare Daily   Continuous Infusions:  magnesium sulfate bolus IVPB     PRN Meds: acetaminophen, sodium chloride, sodium chloride flush   Vital Signs    Vitals:   04/24/23 0818 04/24/23 1448 04/24/23 2009 04/25/23 0334  BP: 126/75 (!) 147/94 (!) 153/96 132/80  Pulse: 64 65 76 68  Resp: 18 16 20 19   Temp: 97.8 F (36.6 C) 97.9 F (36.6 C)  97.9 F (36.6 C)  TempSrc: Oral Oral  Oral  SpO2: 93% 95%  94%  Weight:      Height:        Intake/Output Summary (Last 24 hours) at 04/25/2023 5621 Last data filed at 04/24/2023 2000 Gross per 24 hour  Intake 600 ml  Output 600 ml  Net 0 ml   Filed Weights   04/23/23 1453  Weight: 125.8 kg    Physical Exam    GEN- NAD, A&O x 3. Normal affect. Sitting up in bed. Lungs- CTAB, Normal effort.  Heart- Regular rate and rhythm. No  M/G/R GI- Soft, NT, ND Extremities- No clubbing, cyanosis, or edema Skin- no rash or lesion  Labs    CBC No results for input(s): "WBC", "NEUTROABS", "HGB", "HCT", "MCV", "PLT" in the last 72 hours. Basic Metabolic Panel Recent Labs    30/86/57 0406 04/25/23 0331  NA 137 137  K 3.4* 4.0  CL 103 105  CO2 23 21*  GLUCOSE 92 84  BUN 9 11  CREATININE 0.79 0.91  CALCIUM 8.8* 8.9  MG 1.9 1.9    Telemetry    SR 60's with PVC's, one episode of ~1h 50 minutes of AF with V rates 100-120 (personally reviewed)  Patient Profile     Catherine Callahan is a 68 y.o. female with a past medical history significant for persistent atrial fibrillation.  They were admitted for tikosyn load.   Assessment & Plan    Persistent Atrial Fibrillation Pt remains in NSR on Tikosyn 250 mcg BID  Continue Eliquis Creatinine, ser  0.91 (02/20 0331) Magnesium  1.9 (02/20 0331) Potassium4.0 (02/20 0331) Supplement K with diuresis  Lasix 20 mg IV x1 with weight gain / LE  edema Update ECHO with recent edema in LE's   Plan for home Friday if QTc remains stable.      For questions or updates, please contact [facility] Please consult [email or web address] for contact info under Cardiology/STEMI.  Signed, Canary Brim, NP-C, AGACNP-BC [facility] - Electrophysiology  04/25/2023, 8:42 AM

## 2023-04-25 NOTE — Progress Notes (Signed)
  Echocardiogram 2D Echocardiogram has been performed.  Catherine Callahan 04/25/2023, 11:27 AM

## 2023-04-26 ENCOUNTER — Other Ambulatory Visit (HOSPITAL_COMMUNITY): Payer: Self-pay

## 2023-04-26 LAB — BASIC METABOLIC PANEL
Anion gap: 9 (ref 5–15)
BUN: 14 mg/dL (ref 8–23)
CO2: 26 mmol/L (ref 22–32)
Calcium: 9.4 mg/dL (ref 8.9–10.3)
Chloride: 102 mmol/L (ref 98–111)
Creatinine, Ser: 1.05 mg/dL — ABNORMAL HIGH (ref 0.44–1.00)
GFR, Estimated: 58 mL/min — ABNORMAL LOW (ref >60)
Glucose, Bld: 96 mg/dL (ref 70–99)
Potassium: 3.6 mmol/L (ref 3.5–5.1)
Sodium: 137 mmol/L (ref 135–145)

## 2023-04-26 LAB — MAGNESIUM: Magnesium: 1.9 mg/dL (ref 1.7–2.4)

## 2023-04-26 MED ORDER — POTASSIUM CHLORIDE CRYS ER 20 MEQ PO TBCR: 40.0000 meq | EXTENDED_RELEASE_TABLET | Freq: Once | ORAL | Status: DC

## 2023-04-26 MED ORDER — POTASSIUM CHLORIDE CRYS ER 20 MEQ PO TBCR
40.0000 meq | EXTENDED_RELEASE_TABLET | Freq: Every day | ORAL | 6 refills | Status: DC
Start: 2023-04-26 — End: 2023-05-29
  Filled 2023-04-26: qty 70, 30d supply, fill #0

## 2023-04-26 MED ORDER — FUROSEMIDE 20 MG PO TABS
20.0000 mg | ORAL_TABLET | Freq: Every day | ORAL | 11 refills | Status: AC | PRN
  Filled 2023-04-26: qty 30, 30d supply, fill #0

## 2023-04-26 MED ORDER — MAGNESIUM OXIDE -MG SUPPLEMENT 400 (240 MG) MG PO TABS
400.0000 mg | ORAL_TABLET | Freq: Two times a day (BID) | ORAL | 1 refills | Status: DC
  Filled 2023-04-26: qty 90, 45d supply, fill #0

## 2023-04-26 MED ORDER — POTASSIUM CHLORIDE CRYS ER 20 MEQ PO TBCR
60.0000 meq | EXTENDED_RELEASE_TABLET | Freq: Once | ORAL | Status: AC
  Administered 2023-04-26: 60 meq via ORAL
  Filled 2023-04-26: qty 3

## 2023-04-26 MED ORDER — POTASSIUM CHLORIDE CRYS ER 20 MEQ PO TBCR
40.0000 meq | EXTENDED_RELEASE_TABLET | Freq: Every day | ORAL | 6 refills | Status: DC
  Filled 2023-04-26: qty 70, 35d supply, fill #0

## 2023-04-26 MED ORDER — DOFETILIDE 125 MCG PO CAPS
125.0000 ug | ORAL_CAPSULE | Freq: Two times a day (BID) | ORAL | 11 refills | Status: DC
  Filled 2023-04-26: qty 60, 30d supply, fill #0

## 2023-04-26 MED ORDER — MAGNESIUM SULFATE 2 GM/50ML IV SOLN
2.0000 g | Freq: Once | INTRAVENOUS | Status: AC
  Administered 2023-04-26: 2 g via INTRAVENOUS
  Filled 2023-04-26: qty 50

## 2023-04-26 NOTE — Progress Notes (Addendum)
 EKG from yesterday evening 04/25/2023 reviewed     Shows remains in NSR with borderline QTc at 494 ms.  Continue  Tikosyn 125 mcg BID.   Potassium3.6 (02/21 0354) Magnesium  1.9 (02/21 0354) Creatinine, ser  1.05* (02/21 0354)  Mg+ and K+ supplementation this am.   Plan for home Friday if QTc remains stable     Canary Brim, NP-C, AGACNP-BC [facility] - Electrophysiology  04/26/2023, 7:06 AM

## 2023-04-26 NOTE — Discharge Summary (Addendum)
 ELECTROPHYSIOLOGY DISCHARGE SUMMARY    Patient ID: Catherine Callahan,  MRN: [id number], DOB/AGE: [birth year] 68 y.o.  Admit date: 04/23/2023 Discharge date: 04/26/2023  Primary Care Physician: Merri Brunette, MD  Primary Cardiologist: Lesleigh Noe, MD (Inactive)  Electrophysiologist: Dr. Nelly Laurence   Primary Discharge Diagnosis:  1.  Persistent atrial fibrillation status post Tikosyn loading this admission  Secondary Discharge Diagnosis:  Secondary Hypercoagulable State  Obesity  LE Edema  Hypokalemia Hypomagnesemia    Allergies  Allergen Reactions   Penicillin G Anaphylaxis   Penicillins Anaphylaxis   Latex Rash and Hives   Meloxicam Hives     Procedures This Admission:  1.  Tikosyn loading   Brief HPI: Catherine Callahan is a 68 y.o. female with a past medical history as noted above.  They were referred to EP for treatment options of atrial fibrillation.  Risks, benefits, and alternatives to Tikosyn were reviewed with the patient who wished to proceed with admission for loading.  Hospital Course:  The patient was admitted and Tikosyn was initiated.  Renal function and electrolytes were followed during the hospitalization. The patient presented in sinus rhythm and did not require cardioversion. The patients QT prolonged, requiring dose reduction to 125 mcg BID. She had electrolyte disturbances which required replacement of potassium and magnesium. She reported increased LE swelling and weight gain.  She was given a one time dose of IV lasix 20mg  which good response.  ECHO was updated which showed normal LVEF, no diastolic dysfunction, RA/LA mild dilation. She requested lasix at discharge for swelling as needed.  Reviewed with patient, who is a retired [employer] fundraiser, that if she takes lasix to take an extra 20 mEq of potassium that day.  They were monitored on telemetry up to discharge. On the day of discharge, they were examined by Dr. Nelly Laurence  who considered them stable for  discharge to home.  Follow-up has been arranged with the Atrial Fibrillation clinic in approximately 1 week.   She will need repeat BMP to assess renal function / electrolytes at follow up with Tikosyn and PRN lasix use.   Physical Exam: Vitals:   04/25/23 2006 04/25/23 2354 04/26/23 0521 04/26/23 0751  BP: 134/82  134/87 (!) 142/92  Pulse: 79 68 66 79  Resp: 20 20 18 20   Temp: 98.3 F (36.8 C)  97.6 F (36.4 C) 97.6 F (36.4 C)  TempSrc: Oral  Oral Oral  SpO2: 94% 94% 93%   Weight:      Height:        GEN- NAD, A&O x 3. Normal affect.  Lungs- CTAB, Normal effort.  Heart- Regular rate and rhythm. No M/G/R GI- Soft, NT, ND Extremities- No clubbing, cyanosis, or edema Skin- no rash or lesion  Labs:   Lab Results  Component Value Date   WBC 5.6 11/02/2022   HGB 13.9 11/02/2022   HCT 42.5 11/02/2022   MCV 105 (H) 11/02/2022   PLT 198 11/02/2022    Recent Labs  Lab 04/26/23 0354  NA 137  K 3.6  CL 102  CO2 26  BUN 14  CREATININE 1.05*  CALCIUM 9.4  GLUCOSE 96    Discharge Medications:  Allergies as of 04/26/2023       Reactions   Penicillin G Anaphylaxis   Penicillins Anaphylaxis   Latex Rash, Hives   Meloxicam Hives        Medication List     TAKE these medications    acetaminophen 500 MG tablet  Commonly known as: TYLENOL Take 1,000 mg by mouth every 6 (six) hours as needed for moderate pain.   acyclovir 200 MG capsule Commonly known as: ZOVIRAX Take 200 mg by mouth daily as needed (blisters).   apixaban 5 MG Tabs tablet Commonly known as: Eliquis Take 1 tablet (5 mg total) by mouth 2 (two) times daily.   dofetilide 125 MCG capsule Commonly known as: TIKOSYN Take 1 capsule (125 mcg total) by mouth 2 (two) times daily.   ezetimibe 10 MG tablet Commonly known as: ZETIA TAKE 1 TABLET BY MOUTH EVERY DAY   FIBER PO Take 4 capsules by mouth at bedtime.   furosemide 20 MG tablet Commonly known as: Lasix Take 1 tablet (20 mg total) by  mouth daily as needed. Take if increased lower extremity swelling or weight gain > 2.5lbs in 2-3 days or > 5lbs in one week   losartan 100 MG tablet Commonly known as: COZAAR TAKE 1 TABLET BY MOUTH EVERY DAY What changed: when to take this   magnesium oxide 400 (240 Mg) MG tablet Commonly known as: MAG-OX Take 1 tablet (400 mg total) by mouth 2 (two) times daily. What changed: when to take this   Mesalamine 800 MG Tbec Take 800 mg by mouth daily.   metoprolol succinate 50 MG 24 hr tablet Commonly known as: TOPROL-XL TAKE 1 TABLET BY MOUTH EVERY DAY. TAKE IMMEDIATELY FOLLOWING A MEAL.   metoprolol tartrate 25 MG tablet Commonly known as: LOPRESSOR Take 1 tablet (25 mg total) by mouth as needed. What changed:  when to take this reasons to take this   multivitamin with minerals Tabs tablet Take 1 tablet by mouth daily. Centrum Silver   potassium chloride SA 20 MEQ tablet Commonly known as: KLOR-CON M Take 2 tablets (40 mEq total) by mouth daily. If you take an as needed dose of lasix, take an extra 20 mEq of potassium with it.   rosuvastatin 40 MG tablet Commonly known as: CRESTOR TAKE 1 TABLET BY MOUTH EVERY DAY        Disposition:  Home with follow up in AF clinic in 1 week as in AVS.   Duration of Discharge Encounter: 36 minutes  Signed, Canary Brim, NP-C, AGACNP-BC  [facility] - Electrophysiology  04/26/2023, 2:00 PM

## 2023-04-26 NOTE — Progress Notes (Addendum)
 Pharmacy: Dofetilide (Tikosyn) - Follow Up Assessment and Electrolyte Replacement  Pharmacy consulted to assist in monitoring and replacing electrolytes in this 68 y.o. female admitted on 04/23/2023 undergoing dofetilide initiation. First dofetilide dose: 2/18@2234 .   Labs:    Component Value Date/Time   K 3.6 04/26/2023 0354   MG 1.9 04/26/2023 0354     Plan: Potassium: K 3.5-3.7:  Give KCl 60 mEq po x1   Magnesium: Mg 1.8-2: Give Mg 2 gm IV x1   As patient has required on average 45 mEq of potassium replacement every day, recommend discharging patient with prescription for:  Potassium chloride 40 mEq  daily  Also required 10g of IV Mag, would prescribe 400 mg BID of Mag-oxide at discharge.  Thank you for allowing pharmacy to participate in this patient's care,  Sherron Monday, PharmD, BCCCP Clinical Pharmacist  Phone: [phone] 04/26/2023 7:12 AM  Please check AMION for all [facility] Pharmacy phone numbers After 10:00 PM, call Main Pharmacy [phone]

## 2023-04-29 ENCOUNTER — Ambulatory Visit (HOSPITAL_BASED_OUTPATIENT_CLINIC_OR_DEPARTMENT_OTHER)
Admission: RE | Admit: 2023-04-29 | Discharge: 2023-04-29 | Disposition: A | Discharge status: Home / Self Care | Payer: Medicare Other | Source: Ambulatory Visit | Attending: Acute Care | Admitting: Acute Care

## 2023-04-29 DIAGNOSIS — Z87891 Personal history of nicotine dependence: Secondary | ICD-10-CM | POA: Diagnosis not present

## 2023-04-29 DIAGNOSIS — Z122 Encounter for screening for malignant neoplasm of respiratory organs: Secondary | ICD-10-CM | POA: Insufficient documentation

## 2023-05-03 ENCOUNTER — Ambulatory Visit (HOSPITAL_COMMUNITY)
Admit: 2023-05-03 | Discharge: 2023-05-03 | Disposition: A | Discharge status: Home / Self Care | Payer: Medicare Other | Source: Ambulatory Visit | Attending: Internal Medicine | Admitting: Internal Medicine

## 2023-05-03 VITALS — BP 96/80 | HR 100 | Ht 65.0 in | Wt 266.0 lb

## 2023-05-03 DIAGNOSIS — Z7901 Long term (current) use of anticoagulants: Secondary | ICD-10-CM | POA: Diagnosis not present

## 2023-05-03 DIAGNOSIS — Z6841 Body Mass Index (BMI) 40.0 and over, adult: Secondary | ICD-10-CM | POA: Diagnosis not present

## 2023-05-03 DIAGNOSIS — Z5181 Encounter for therapeutic drug level monitoring: Secondary | ICD-10-CM

## 2023-05-03 DIAGNOSIS — I480 Paroxysmal atrial fibrillation: Secondary | ICD-10-CM | POA: Diagnosis not present

## 2023-05-03 DIAGNOSIS — I4891 Unspecified atrial fibrillation: Secondary | ICD-10-CM

## 2023-05-03 DIAGNOSIS — K5190 Ulcerative colitis, unspecified, without complications: Secondary | ICD-10-CM | POA: Diagnosis not present

## 2023-05-03 DIAGNOSIS — I10 Essential (primary) hypertension: Secondary | ICD-10-CM | POA: Insufficient documentation

## 2023-05-03 DIAGNOSIS — D6869 Other thrombophilia: Secondary | ICD-10-CM | POA: Diagnosis not present

## 2023-05-03 DIAGNOSIS — I4892 Unspecified atrial flutter: Secondary | ICD-10-CM | POA: Insufficient documentation

## 2023-05-03 DIAGNOSIS — I2510 Atherosclerotic heart disease of native coronary artery without angina pectoris: Secondary | ICD-10-CM | POA: Insufficient documentation

## 2023-05-03 DIAGNOSIS — E669 Obesity, unspecified: Secondary | ICD-10-CM | POA: Diagnosis not present

## 2023-05-03 DIAGNOSIS — Z79899 Other long term (current) drug therapy: Secondary | ICD-10-CM | POA: Insufficient documentation

## 2023-05-03 DIAGNOSIS — G4733 Obstructive sleep apnea (adult) (pediatric): Secondary | ICD-10-CM | POA: Diagnosis not present

## 2023-05-03 LAB — BASIC METABOLIC PANEL
Anion gap: 13 (ref 5–15)
BUN: 8 mg/dL (ref 8–23)
CO2: 25 mmol/L (ref 22–32)
Calcium: 9.7 mg/dL (ref 8.9–10.3)
Chloride: 106 mmol/L (ref 98–111)
Creatinine, Ser: 0.86 mg/dL (ref 0.44–1.00)
GFR, Estimated: >60 mL/min (ref >60)
Glucose, Bld: 99 mg/dL (ref 70–99)
Potassium: 4.6 mmol/L (ref 3.5–5.1)
Sodium: 144 mmol/L (ref 135–145)

## 2023-05-03 LAB — MAGNESIUM: Magnesium: 1.9 mg/dL (ref 1.7–2.4)

## 2023-05-03 MED ORDER — DOFETILIDE 125 MCG PO CAPS: 125.0000 ug | ORAL_CAPSULE | Freq: Two times a day (BID) | ORAL | 1 refills | Status: DC

## 2023-05-03 NOTE — Progress Notes (Signed)
 Primary Care Physician: Merri Brunette, MD Primary Cardiologist: Dr Duke Salvia  Primary Electrophysiologist: Dr Nelly Laurence  Referring Physician: Dr Lavon Paganini is a 68 y.o. female with a history of atrial flutter, paroxysmal atrial fibrillation, OSA, HTN, and ulcerative colitis who presents for follow up in the [facility].  The patient was initially diagnosed with atrial flutter in 2012 and subsequently atrial fibrillation. She was maintaining on flecainide but had increasing episodes of afib with symptoms of SOB, fatigue, and palpitations. Patient is on Eliquis for a CHADS2VASC score of 3. She underwent afib and flutter ablation with Dr Johney Frame on 06/30/19. Unfortunately, she continued to have symptomatic afib and had repeat ablation (both PVI and CTI) on 01/17/21.   Patient returns for follow up for atrial fibrillation. At her last visit, she reported that she had much more frequent afib episodes. She presents today for dofetilide admission. She denies any missed doses of anticoagulation. She continues to have daily symptoms of tachypalpitations and fatigue with exertion. Currently in SR.   On follow up 05/03/23, she is here for 1 week Tikosyn surveillance. S/p Tikosyn admission 2/18-21/25. She did not require cardioversion. QT prolongation occurred which resulted in discharge dose being 125 mcg BID. Given prn lasix for swelling / weight gain. She has been going in and out of rhythm for the past 2 days. She can feel she is out of rhythm but notes to overall feel okay. No missed doses of Tikosyn or Eliquis.   Today, he denies symptoms of chest pain, shortness of breath, orthopnea, PND, lower extremity edema, dizziness, presyncope, syncope, bleeding, or neurologic sequela. The patient is tolerating medications without difficulties and is otherwise without complaint today.    Atrial Fibrillation Risk Factors:  she does have symptoms or diagnosis of sleep  apnea.   Atrial Fibrillation Management history:  Previous antiarrhythmic drugs: flecainide, tikosyn Previous cardioversions: none Previous ablations: 06/30/19 afib and flutter, 01/17/21 afib and flutter Anticoagulation history: Eliquis   Past Medical History:  Diagnosis Date   Arthritis    Atrial flutter (HCC) 01/05/2013   CAD (coronary artery disease) 07/01/2019   Encounter for monitoring flecainide therapy 01/11/2011   Hypertension, essential, benign 01/11/2011   OSA on CPAP 01/05/2013   Overweight    Paroxysmal atrial fibrillation (HCC)    Ulcerative colitis (HCC)     Current Outpatient Medications  Medication Sig Dispense Refill   acetaminophen (TYLENOL) 500 MG tablet Take 1,000 mg by mouth as needed for moderate pain (pain score 4-6).     acyclovir (ZOVIRAX) 200 MG capsule Take 200 mg by mouth daily as needed (blisters).     apixaban (ELIQUIS) 5 MG TABS tablet Take 1 tablet (5 mg total) by mouth 2 (two) times daily. 60 tablet 6   dofetilide (TIKOSYN) 125 MCG capsule Take 1 capsule (125 mcg total) by mouth 2 (two) times daily. 60 capsule 11   ezetimibe (ZETIA) 10 MG tablet TAKE 1 TABLET BY MOUTH EVERY DAY 90 tablet 3   FIBER PO Take 4 capsules by mouth at bedtime.     furosemide (LASIX) 20 MG tablet Take 1 tablet (20 mg total) by mouth daily as needed. Take if increased lower extremity swelling or weight gain > 2.5lbs in 2-3 days or > 5lbs in one week 30 tablet 11   losartan (COZAAR) 100 MG tablet TAKE 1 TABLET BY MOUTH EVERY DAY (Patient taking differently: Take 100 mg by mouth every morning.) 90 tablet 2  magnesium oxide (MAG-OX) 400 (240 Mg) MG tablet Take 1 tablet (400 mg total) by mouth 2 (two) times daily. 90 tablet 1   Mesalamine 800 MG TBEC Take 800 mg by mouth daily.      metoprolol succinate (TOPROL-XL) 50 MG 24 hr tablet TAKE 1 TABLET BY MOUTH EVERY DAY. TAKE IMMEDIATELY FOLLOWING A MEAL. (Patient taking differently: In the evening) 90 tablet 3   metoprolol  tartrate (LOPRESSOR) 25 MG tablet Take 1 tablet (25 mg total) by mouth as needed. (Patient taking differently: Take 25 mg by mouth daily as needed (for afib).) 45 tablet 3   Multiple Vitamin (MULTIVITAMIN WITH MINERALS) TABS tablet Take 1 tablet by mouth daily. Centrum Silver     potassium chloride SA (KLOR-CON M) 20 MEQ tablet Take 2 tablets (40 mEq total) by mouth daily. If you take an as needed dose of lasix, take an extra 20 mEq of potassium with it. 70 tablet 6   rosuvastatin (CRESTOR) 40 MG tablet TAKE 1 TABLET BY MOUTH EVERY DAY 90 tablet 3   No current facility-administered medications for this encounter.    ROS- All systems are reviewed and negative except as per the HPI above.  Physical Exam: Vitals:   05/03/23 0840  BP: 96/80  Pulse: 100  Weight: 120.7 kg  Height: 5\' 5"  (1.651 m)    GEN- The patient is well appearing, alert and oriented x 3 today.   Neck - no JVD or carotid bruit noted Lungs- Clear to ausculation bilaterally, normal work of breathing Heart- Irregular rate and rhythm, no murmurs, rubs or gallops, PMI not laterally displaced Extremities- no clubbing, cyanosis, or edema Skin - no rash or ecchymosis noted   Wt Readings from Last 3 Encounters:  05/03/23 120.7 kg  04/23/23 125.8 kg  04/23/23 124.9 kg    EKG today demonstrates  Vent. rate 100 BPM PR interval * ms QRS duration 72 ms QT/QTcB 388/500 ms P-R-T axes 81 -10 85 Atrial flutter with variable A-V block Septal infarct , age undetermined Abnormal ECG When compared with ECG of 26-Apr-2023 11:34, PREVIOUS ECG IS PRESENT   Echo 04/25/23: 1. Left ventricular ejection fraction, by estimation, is 55 to 60%. The  left ventricle has normal function. Left ventricular endocardial border  not optimally defined to evaluate regional wall motion. Left ventricular  diastolic parameters were grossly  normal.   2. Right ventricular systolic function is normal. The right ventricular  size is normal. There  is normal pulmonary artery systolic pressure. The  estimated right ventricular systolic pressure is 34.8 mmHg.   3. Left atrial size was mildly dilated.   4. Right atrial size was mildly dilated.   5. The mitral valve is grossly normal. Trivial mitral valve  regurgitation. No evidence of mitral stenosis.   6. The aortic valve was not well visualized. Aortic valve regurgitation  is not visualized. No aortic stenosis is present.   7. The inferior vena cava is normal in size with greater than 50%  respiratory variability, suggesting right atrial pressure of 3 mmHg.    Epic records are reviewed at length today  CHA2DS2-VASc Score = 4  The patient's score is based upon: CHF History: 0 HTN History: 1 Diabetes History: 0 Stroke History: 0 Vascular Disease History: 1 Age Score: 1 Gender Score: 1      ASSESSMENT AND PLAN: Paroxysmal Atrial Fibrillation/atrial flutter The patient's CHA2DS2-VASc score is 4, indicating a 4.8% annual risk of stroke.   S/p afib and flutter ablation 06/2019  and repeat ablation 01/2021 S/p Tikosyn admission 2/18-21/25.  She is currently in atrial flutter. She has been paroxysmal so will continue current regimen without change and reassess in 1 month.   High risk medication monitoring (ICD10: R7229428) Patient requires ongoing monitoring for anti-arrhythmic medication which has the potential to cause life threatening arrhythmias or AV block. Qtc stable. Continue Tikosyn 125 mcg BID. Bmet and mag drawn today.  Secondary Hypercoagulable State (ICD10:  D68.69) The patient is at significant risk for stroke/thromboembolism based upon her CHA2DS2-VASc Score of 4.  Continue Apixaban (Eliquis).  No bleeding issues.  Obesity Body mass index is 44.26 kg/m.  Encouraged exercise as tolerated.  OSA  Encouraged nightly CPAP  HTN Hypotensive today but she is asymptomatic, continue to monitor.   CAD CAC score 648 on CT No chest pain.     Follow up in 1 month  for Tikosyn surveillance as scheduled.    Justin Mend, PA-C [facility] [address] [place], [place] [address] [phone] 05/03/2023 9:15 AM

## 2023-05-20 ENCOUNTER — Other Ambulatory Visit: Payer: Self-pay

## 2023-05-20 DIAGNOSIS — Z122 Encounter for screening for malignant neoplasm of respiratory organs: Secondary | ICD-10-CM

## 2023-05-20 DIAGNOSIS — Z87891 Personal history of nicotine dependence: Secondary | ICD-10-CM

## 2023-05-23 NOTE — Progress Notes (Unsigned)
 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   The patient was admitted from 2/18-2/21/25 for Tikosyn loading.   Since last being seen in our clinic the patient reports she had a rough few weeks after discharge from hospital.  She felt like she was in and out of AF.  She now feels like she has settled out and has stayed in SR.  No issues with Eliquis. She has only taken lasix 1x since her discharge and took potassium with it. She feels she is less swollen since being in SR.    She denies chest pain, palpitations, dyspnea, PND, orthopnea, nausea, vomiting, dizziness, syncope, edema, weight gain, or early satiety.   Review of systems complete and found to be negative unless listed in HPI.   EP Information / Studies Reviewed:    EKG is ordered today. Personal review as below.  EKG Interpretation Date/Time:  Friday May 24 2023 10:53:46 EDT Ventricular Rate:  72 PR Interval:  228 QRS Duration:  74 QT Interval:  412 QTC Calculation: 451 R Axis:   2  Text Interpretation: Sinus rhythm with 1st degree A-V block Confirmed by Canary Brim ([id number]) on 05/24/2023 11:04:07 AM   Studies:  EPS 06/2019 > PVI RF ablation, CTI ablation (had return of AF in 01/2021)  CT Cardiac Morphology 01/2021 > normal pulmonary vein drainage into LA, no PFO/ASD, normal coronary origin with right dominance, CAC 648 which is 96th percentile for matched controls EPS 01/2021 > Return of conduction within the right superior and right inferior pulmonary veins from a prior ablation. There was also trivial  return of electrical activity along the anterior carina of the left superior pulmonary vein.  Re-isolation of the pulmonary veins with RF, CTI ablation performed   Arrhythmia / AAD AF > initial dx ~ 2014 Flecainide > failed    Risk Assessment/Calculations:    CHA2DS2-VASc Score = 4   This indicates a 4.8% annual risk of stroke. The patient's score is based upon: CHF History: 0 HTN History: 1 Diabetes History: 0 Stroke History: 0 Vascular Disease History: 1 Age Score: 1 Gender Score: 1             Physical Exam:   VS:  BP 106/64   Pulse 72   Ht 5\' 6"  (1.676 m)   Wt 270 lb (122.5 kg)   SpO2 98%   BMI 43.58 kg/m    Wt Readings from Last 3 Encounters:  05/24/23 270 lb (122.5 kg)  05/03/23 266 lb (120.7 kg)  04/23/23 277 lb 4.8 oz (125.8 kg)     GEN: Well nourished, well developed in no acute distress NECK: No JVD; No carotid bruits CARDIAC: Regular rate and rhythm, no murmurs, rubs, gallops RESPIRATORY:  Clear to auscultation without rales, wheezing or rhonchi  ABDOMEN: Soft, non-tender, non-distended EXTREMITIES:  No edema; No deformity   ASSESSMENT AND PLAN:    Paroxysmal Atrial Fibrillation  Atrial Flutter  CHA2DS2-VASc 4, failed flecainide, s/p ablation x2 (both PVI & CTI) -continue Tikosyn 125 mcg   -EKG with NSR, stable QTc at -update  Tikosyn labs > BMP, Mg+  -OAC for stroke prophylaxis  -continue metoprolol  -she is working on weight loss with a goal to get to ablation  Secondary Hypercoagulable State  -continue Eliquis, dose reviewed and appropriate by age/wt   Hypertension  -well controlled on current regimen    OSA  -CPAP compliance encouraged   1AVB -monitor  LE Edema  -PRN lasix, caution with Tikosyn / electrolytes  Follow up with EP APP in 3 months  Signed, Canary Brim, NP-C, AGACNP-BC [facility] - Electrophysiology  05/24/2023, 2:04 PM

## 2023-05-24 ENCOUNTER — Encounter: Payer: Self-pay | Admitting: Pulmonary Disease

## 2023-05-24 ENCOUNTER — Ambulatory Visit: Payer: Medicare Other | Attending: Pulmonary Disease | Admitting: Pulmonary Disease

## 2023-05-24 VITALS — BP 106/64 | HR 72 | Ht 66.0 in | Wt 270.0 lb

## 2023-05-24 DIAGNOSIS — I10 Essential (primary) hypertension: Secondary | ICD-10-CM | POA: Diagnosis not present

## 2023-05-24 DIAGNOSIS — I4892 Unspecified atrial flutter: Secondary | ICD-10-CM

## 2023-05-24 DIAGNOSIS — I4891 Unspecified atrial fibrillation: Secondary | ICD-10-CM

## 2023-05-24 DIAGNOSIS — G4733 Obstructive sleep apnea (adult) (pediatric): Secondary | ICD-10-CM | POA: Diagnosis not present

## 2023-05-24 DIAGNOSIS — D6869 Other thrombophilia: Secondary | ICD-10-CM

## 2023-05-24 LAB — BASIC METABOLIC PANEL
BUN/Creatinine Ratio: 19 (ref 12–28)
BUN: 16 mg/dL (ref 8–27)
CO2: 24 mmol/L (ref 20–29)
Calcium: 9.8 mg/dL (ref 8.7–10.3)
Chloride: 102 mmol/L (ref 96–106)
Creatinine, Ser: 0.85 mg/dL (ref 0.57–1.00)
Glucose: 109 mg/dL — ABNORMAL HIGH (ref 70–99)
Potassium: 5 mmol/L (ref 3.5–5.2)
Sodium: 142 mmol/L (ref 134–144)
eGFR: 75 mL/min/{1.73_m2} (ref >59)

## 2023-05-24 LAB — MAGNESIUM: Magnesium: 1.8 mg/dL (ref 1.6–2.3)

## 2023-05-24 NOTE — Patient Instructions (Signed)
 Medication Instructions:  Your physician recommends that you continue on your current medications as directed. Please refer to the Current Medication list given to you today.  *If you need a refill on your cardiac medications before your next appointment, please call your pharmacy*  Lab Work: BMP, MAG-TODAY If you have labs (blood work) drawn today and your tests are completely normal, you will receive your results only by: MyChart Message (if you have MyChart) OR A paper copy in the mail If you have any lab test that is abnormal or we need to change your treatment, we will call you to review the results.  Follow-Up: At [facility], you and your health needs are our priority.  As part of our continuing mission to provide you with exceptional heart care, we have created designated Provider Care Teams.  These Care Teams include your primary Cardiologist (physician) and Advanced Practice Providers (APPs -  Physician Assistants and Nurse Practitioners) who all work together to provide you with the care you need, when you need it.  Your next appointment:   3 month(s)  Provider:   Canary Brim, NP

## 2023-05-29 ENCOUNTER — Telehealth: Payer: Self-pay | Admitting: Pulmonary Disease

## 2023-05-29 MED ORDER — POTASSIUM CHLORIDE CRYS ER 20 MEQ PO TBCR: 40.0000 meq | EXTENDED_RELEASE_TABLET | Freq: Every day | ORAL | 3 refills | Status: DC

## 2023-05-29 NOTE — Telephone Encounter (Signed)
*  STAT* If patient is at the pharmacy, call can be transferred to refill team.   1. Which medications need to be refilled? (please list name of each medication and dose if known)   potassium chloride SA (KLOR-CON M) 20 MEQ tablet     2. Would you like to learn more about the convenience, safety, & potential cost savings by using the [facility]? NO   3. Are you open to using the [facility] (Type [facility].  ). No   4. Which pharmacy/location (including street and city if local pharmacy) is medication to be sent to? [id number] - [place], [place] - [address] AT [facility] [address]    5. Do they need a 30 day or 90 day supply? 90 day   Pt took last tablet this morning

## 2023-05-29 NOTE — Telephone Encounter (Signed)
 Pt's medication was sent to pt's pharmacy as requested. Confirmation received.

## 2023-05-30 ENCOUNTER — Other Ambulatory Visit: Payer: Self-pay | Admitting: *Deleted

## 2023-05-30 DIAGNOSIS — Z79899 Other long term (current) drug therapy: Secondary | ICD-10-CM

## 2023-06-02 DIAGNOSIS — G4733 Obstructive sleep apnea (adult) (pediatric): Secondary | ICD-10-CM | POA: Diagnosis not present

## 2023-06-05 ENCOUNTER — Other Ambulatory Visit (HOSPITAL_BASED_OUTPATIENT_CLINIC_OR_DEPARTMENT_OTHER): Payer: Self-pay | Admitting: Cardiovascular Disease

## 2023-06-05 DIAGNOSIS — I4891 Unspecified atrial fibrillation: Secondary | ICD-10-CM

## 2023-06-05 NOTE — Telephone Encounter (Signed)
 Eliquis 5mg  refill request received. Patient is 68 years old, weight-122.5kg, Crea-0.85 on 05/24/23, Diagnosis-Afib, and last seen by Canary Brim on 05/24/23. Dose is appropriate based on dosing criteria. Will send in refill to requested pharmacy.

## 2023-06-08 LAB — BASIC METABOLIC PANEL WITH GFR
BUN/Creatinine Ratio: 15 (ref 12–28)
BUN: 12 mg/dL (ref 8–27)
CO2: 22 mmol/L (ref 20–29)
Calcium: 9.9 mg/dL (ref 8.7–10.3)
Chloride: 102 mmol/L (ref 96–106)
Creatinine, Ser: 0.79 mg/dL (ref 0.57–1.00)
Glucose: 94 mg/dL (ref 70–99)
Potassium: 5.1 mmol/L (ref 3.5–5.2)
Sodium: 142 mmol/L (ref 134–144)
eGFR: 81 mL/min/{1.73_m2} (ref >59)

## 2023-06-10 ENCOUNTER — Other Ambulatory Visit: Payer: Self-pay

## 2023-06-10 ENCOUNTER — Other Ambulatory Visit: Payer: Self-pay | Admitting: *Deleted

## 2023-06-10 DIAGNOSIS — Z79899 Other long term (current) drug therapy: Secondary | ICD-10-CM

## 2023-06-25 DIAGNOSIS — Z79899 Other long term (current) drug therapy: Secondary | ICD-10-CM | POA: Diagnosis not present

## 2023-06-26 LAB — BASIC METABOLIC PANEL WITH GFR
BUN/Creatinine Ratio: 16 (ref 12–28)
BUN: 17 mg/dL (ref 8–27)
CO2: 26 mmol/L (ref 20–29)
Calcium: 10.2 mg/dL (ref 8.7–10.3)
Chloride: 98 mmol/L (ref 96–106)
Creatinine, Ser: 1.04 mg/dL — ABNORMAL HIGH (ref 0.57–1.00)
Glucose: 103 mg/dL — ABNORMAL HIGH (ref 70–99)
Potassium: 4.8 mmol/L (ref 3.5–5.2)
Sodium: 137 mmol/L (ref 134–144)
eGFR: 58 mL/min/{1.73_m2} — ABNORMAL LOW (ref >59)

## 2023-06-26 MED ORDER — MAGNESIUM OXIDE -MG SUPPLEMENT 400 (240 MG) MG PO TABS: 400.0000 mg | ORAL_TABLET | Freq: Two times a day (BID) | ORAL | 1 refills | Status: DC

## 2023-07-15 DIAGNOSIS — K080 Exfoliation of teeth due to systemic causes: Secondary | ICD-10-CM | POA: Diagnosis not present

## 2023-08-11 ENCOUNTER — Other Ambulatory Visit (HOSPITAL_BASED_OUTPATIENT_CLINIC_OR_DEPARTMENT_OTHER): Payer: Self-pay | Admitting: Cardiovascular Disease

## 2023-08-20 NOTE — Progress Notes (Signed)
 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   Since last being seen in our clinic the patient reports no issues with Tikosyn  or OAC. She has not missed doses. No bleeding on Eliquis .  She notes significant GI issues with the magnesium . She states she has taken ~ 3 doses of PRN lasix  since discharge from hospital.  Two episodes of AF, one lasted 6h and she was symptomatic.   She denies chest pain, palpitations, dyspnea, PND, orthopnea, nausea, vomiting, dizziness, syncope, edema, weight gain, or early satiety.   Review of systems complete and found to be negative unless listed in HPI.   EP Information / Studies Reviewed:    EKG is ordered today. Personal review as below.  EKG Interpretation Date/Time:  Wednesday August 21 2023 09:10:33 EDT Ventricular Rate:  76 PR Interval:  228 QRS Duration:  80 QT Interval:  434 QTC Calculation: 488 R Axis:   1  Text Interpretation: Sinus rhythm with 1st degree A-V block Confirmed by Creighton Doffing ([id number]) on 08/21/2023 9:20:03 AM   Studies:  EPS 06/2019 > PVI RF ablation, CTI ablation (had return of AF in 01/2021)  CT Cardiac Morphology 01/2021 > normal pulmonary vein drainage into LA, no PFO/ASD, normal coronary origin with right dominance, CAC 648 which is 96th percentile for matched controls EPS 01/2021 > Return of conduction within the right superior and right inferior pulmonary veins from a prior ablation. There was also trivial return of electrical activity along the anterior carina of the left superior pulmonary  vein.  Re-isolation of the pulmonary veins with RF, CTI ablation performed   Arrhythmia / AAD AF > initial dx ~ 2014 Flecainide  > failed  Tikosyn  2/18-2/21/25 loading >   Risk Assessment/Calculations:    CHA2DS2-VASc Score = 4   This indicates a 4.8% annual risk of stroke. The patient's score is based upon: CHF History: 0 HTN History: 1 Diabetes History: 0 Stroke History: 0 Vascular Disease History: 1 Age Score: 1 Gender Score: 1             Physical Exam:   VS:  BP 98/66   Pulse 76   Ht 5' 6 (1.676 m)   Wt 270 lb 8 oz (122.7 kg)   SpO2 99%   BMI 43.66 kg/m    Wt Readings from Last 3 Encounters:  08/21/23 270 lb 8 oz (122.7 kg)  05/24/23 270 lb (122.5 kg)  05/03/23 266 lb (120.7 kg)     GEN: pleasant adult female, well nourished, well developed in no acute distress NECK: No JVD; No carotid bruits CARDIAC: Regular rate and rhythm, no murmurs, rubs, gallops RESPIRATORY:  Clear to auscultation without rales, wheezing or rhonchi  ABDOMEN: Soft, non-tender, non-distended EXTREMITIES:  1+ LE edema; No deformity   ASSESSMENT AND PLAN:    Paroxysmal Atrial Fibrillation  Atrial Flutter High Risk Drug Monitoring: Tikosyn   CHA2DS2-VASc 4, , failed flecainide , s/p ablation x2 (both PVI & CTI)  -EKG with NSR, QTc stable -continue Tikosyn  125 mcg BID -if frequent or recurrent episodes of AF, would consider  it drug failure.  She had intermittent AF during loading period. Other option would be amiodarone.  -continue Metoprolol   -OAC for stroke prophylaxis  -discussed checking price of dofetilide  with [facility] while she is here > may need to transition Rx there, she requests 90 day fill & will let us  know   Secondary Hypercoagulable State  -continue Eliquis  5mg  BID, dose reviewed and appropriate by age / wt   Hypertension  -well controlled on current regimen   OSA  -CPAP compliance encouraged   LE Edema  -PRN lasix , caution with Tikosyn  /  electrolytes   Hypomagnesemia  Hypokalemia  -continue KCL replacement 10 mEq daily with 20 mEq daily if PRN lasix  used -reviewed with pharmacy given GI side effects with mag-ox, patient is going to add fiber supplement to see if bulking agent will help for several weeks. If not, will transition to magnesium  glycinate 650 mg tablets (available from [facility]), 3 tabs TID (closest elemental conversion to current mag-ox dosing of 400 mg BID). If changes to glycinate, will plan for repeat Mg+ 1-2 weeks post transition.      Follow up with Dr. Arlester Ladd or EP APP 4 months   Signed, Creighton Doffing, NP-C, AGACNP-BC [facility] - Electrophysiology  08/21/2023, 12:01 PM

## 2023-08-21 ENCOUNTER — Ambulatory Visit: Attending: Cardiology | Admitting: Pulmonary Disease

## 2023-08-21 ENCOUNTER — Encounter: Payer: Self-pay | Admitting: Pulmonary Disease

## 2023-08-21 VITALS — BP 98/66 | HR 76 | Ht 66.0 in | Wt 270.5 lb

## 2023-08-21 DIAGNOSIS — I10 Essential (primary) hypertension: Secondary | ICD-10-CM | POA: Diagnosis not present

## 2023-08-21 DIAGNOSIS — G4733 Obstructive sleep apnea (adult) (pediatric): Secondary | ICD-10-CM

## 2023-08-21 DIAGNOSIS — I4892 Unspecified atrial flutter: Secondary | ICD-10-CM | POA: Diagnosis not present

## 2023-08-21 DIAGNOSIS — D6869 Other thrombophilia: Secondary | ICD-10-CM | POA: Diagnosis not present

## 2023-08-21 DIAGNOSIS — I480 Paroxysmal atrial fibrillation: Secondary | ICD-10-CM | POA: Diagnosis not present

## 2023-08-21 MED ORDER — POTASSIUM CHLORIDE CRYS ER 20 MEQ PO TBCR: EXTENDED_RELEASE_TABLET | ORAL | Status: DC

## 2023-08-21 NOTE — Patient Instructions (Signed)
 Medication Instructions:  Your physician recommends that you continue on your current medications as directed. Please refer to the Current Medication list given to you today.  *If you need a refill on your cardiac medications before your next appointment, please call your pharmacy*  Lab Work: BMET, MAG-TODAY(We will be in touch with you regarding magnesium ) If you have labs (blood work) drawn today and your tests are completely normal, you will receive your results only by: MyChart Message (if you have MyChart) OR A paper copy in the mail If you have any lab test that is abnormal or we need to change your treatment, we will call you to review the results.  Follow-Up: At [facility], you and your health needs are our priority.  As part of our continuing mission to provide you with exceptional heart care, our providers are all part of one team.  This team includes your primary Cardiologist (physician) and Advanced Practice Providers or APPs (Physician Assistants and Nurse Practitioners) who all work together to provide you with the care you need, when you need it.  Your next appointment:   4 month(s)  Provider:   You may see Efraim Grange, MD or one of the following Advanced Practice Providers on your designated Care Team:   Mertha Abrahams, [place] Merla Starch, PA-C Suzann Riddle, NP Creighton Doffing, NP

## 2023-08-22 ENCOUNTER — Ambulatory Visit: Payer: Self-pay | Admitting: Pulmonary Disease

## 2023-08-22 LAB — BASIC METABOLIC PANEL WITH GFR
BUN/Creatinine Ratio: 10 — ABNORMAL LOW (ref 12–28)
BUN: 10 mg/dL (ref 8–27)
CO2: 23 mmol/L (ref 20–29)
Calcium: 9.8 mg/dL (ref 8.7–10.3)
Chloride: 99 mmol/L (ref 96–106)
Creatinine, Ser: 1.03 mg/dL — ABNORMAL HIGH (ref 0.57–1.00)
Glucose: 96 mg/dL (ref 70–99)
Potassium: 4.8 mmol/L (ref 3.5–5.2)
Sodium: 140 mmol/L (ref 134–144)
eGFR: 59 mL/min/{1.73_m2} — ABNORMAL LOW (ref >59)

## 2023-08-22 LAB — MAGNESIUM: Magnesium: 1.9 mg/dL (ref 1.6–2.3)

## 2023-08-23 ENCOUNTER — Other Ambulatory Visit (HOSPITAL_COMMUNITY): Payer: Self-pay | Admitting: Internal Medicine

## 2023-09-20 DIAGNOSIS — Z8679 Personal history of other diseases of the circulatory system: Secondary | ICD-10-CM | POA: Diagnosis not present

## 2023-09-20 DIAGNOSIS — K5190 Ulcerative colitis, unspecified, without complications: Secondary | ICD-10-CM | POA: Diagnosis not present

## 2023-09-26 DIAGNOSIS — K5190 Ulcerative colitis, unspecified, without complications: Secondary | ICD-10-CM | POA: Diagnosis not present

## 2023-10-10 ENCOUNTER — Other Ambulatory Visit (HOSPITAL_COMMUNITY): Payer: Self-pay | Admitting: Physician Assistant

## 2023-10-15 ENCOUNTER — Other Ambulatory Visit: Payer: Self-pay

## 2023-10-15 MED ORDER — MAGNESIUM OXIDE -MG SUPPLEMENT 400 (240 MG) MG PO TABS: 400.0000 mg | ORAL_TABLET | Freq: Two times a day (BID) | ORAL | 3 refills | Status: AC

## 2023-10-15 MED ORDER — POTASSIUM CHLORIDE ER 10 MEQ PO TBCR: 10.0000 meq | EXTENDED_RELEASE_TABLET | Freq: Every day | ORAL | 3 refills | Status: AC

## 2023-10-15 NOTE — Telephone Encounter (Signed)
 New prescription  sent  - the prescription on 10/11/23 that was sent should be  tablet  400 mg  twice a day.

## 2023-10-15 NOTE — Telephone Encounter (Signed)
 Refill sent to pharmacy.

## 2023-12-02 ENCOUNTER — Other Ambulatory Visit: Payer: Self-pay | Admitting: Family Medicine

## 2023-12-02 DIAGNOSIS — Z1231 Encounter for screening mammogram for malignant neoplasm of breast: Secondary | ICD-10-CM

## 2023-12-11 DIAGNOSIS — I10 Essential (primary) hypertension: Secondary | ICD-10-CM

## 2023-12-11 DIAGNOSIS — E785 Hyperlipidemia, unspecified: Secondary | ICD-10-CM

## 2024-01-01 ENCOUNTER — Telehealth (HOSPITAL_BASED_OUTPATIENT_CLINIC_OR_DEPARTMENT_OTHER): Payer: Self-pay

## 2024-01-01 DIAGNOSIS — Z8679 Personal history of other diseases of the circulatory system: Secondary | ICD-10-CM | POA: Diagnosis not present

## 2024-01-01 DIAGNOSIS — K5190 Ulcerative colitis, unspecified, without complications: Secondary | ICD-10-CM | POA: Diagnosis not present

## 2024-01-01 LAB — LAB REPORT - SCANNED: EGFR: 74

## 2024-01-01 NOTE — Telephone Encounter (Signed)
 Patient needs CBC for pharmacy recommendations please.

## 2024-01-01 NOTE — Telephone Encounter (Signed)
   Pre-operative Risk Assessment    Patient Name: Catherine Callahan  DOB: October 09, 1955 MRN: [id number]   Date of last office visit: 08/21/23 with Aniceto Date of next office visit: NA   Request for Surgical Clearance    Procedure:  Colonoscopy   Date of Surgery:  Clearance 02/05/24                                 Surgeon:  Dr. Elicia Surgeon's Group or Practice Name:  [facility] Phone number:  [phone] Fax number:  [phone]   Type of Clearance Requested:   - Medical  - Pharmacy:  Hold Apixaban  (Eliquis ) 2 days prior   Type of Anesthesia:  propofol    Additional requests/questions:    Bonney Augustin JONETTA Delores   01/01/2024, 3:28 PM

## 2024-01-02 ENCOUNTER — Other Ambulatory Visit: Payer: Self-pay

## 2024-01-02 DIAGNOSIS — Z01818 Encounter for other preprocedural examination: Secondary | ICD-10-CM

## 2024-01-02 NOTE — Telephone Encounter (Signed)
 Left a detailed message for patient that lab order has been sent to [facility] and that she can go to the nearest [facility] or come to the lab located at the [facility] office for bloodwork prior to her surgery which is scheduled on 02/05/24 and to please call the office to schedule TELEVISIT after 01/21/24 prior to surgery date.

## 2024-01-02 NOTE — Telephone Encounter (Signed)
 Patient will need virtual visit once pharmacy recommendations are received. VV could be scheduled after 11/18.

## 2024-01-02 NOTE — Telephone Encounter (Signed)
 Will f/u with Preop APP if pt will need an appt once labs are done with time for pharm-d to review and provider recommendations.

## 2024-01-03 NOTE — Telephone Encounter (Signed)
 2nd attempt to reach the pt. The pt will need lab work for preop clearance.need to schedule 11/18 or after.

## 2024-01-06 ENCOUNTER — Ambulatory Visit
Admission: RE | Admit: 2024-01-06 | Discharge: 2024-01-06 | Disposition: A | Source: Ambulatory Visit | Attending: Family Medicine | Admitting: Family Medicine

## 2024-01-06 DIAGNOSIS — Z1231 Encounter for screening mammogram for malignant neoplasm of breast: Secondary | ICD-10-CM | POA: Diagnosis not present

## 2024-01-06 NOTE — Telephone Encounter (Signed)
 Patient was returning call. Please advise ?

## 2024-01-06 NOTE — Telephone Encounter (Addendum)
 I s/w the pt about lab work to be done CBC. Pt states Dr. Elicia office just drew labs and CBC was one of them. I stated I will call GI office to see if we can get labs faxed to our office as well. If so will have them scanned into her chart.  I left a detailed message for GI office to call back [phone] about recent labs done with their office.   Once we have these results for CBC I will call the pt back schedule the tele preop appt.

## 2024-01-07 ENCOUNTER — Telehealth (HOSPITAL_BASED_OUTPATIENT_CLINIC_OR_DEPARTMENT_OTHER): Payer: Self-pay | Admitting: *Deleted

## 2024-01-07 NOTE — Telephone Encounter (Signed)
 S/w the pt and she has been scheduled tele preop appt 01/22/24. Med rec and consent are done.

## 2024-01-07 NOTE — Telephone Encounter (Signed)
 Patient with diagnosis of A Fib on Eliquis  for anticoagulation.    Procedure: colonoscopy Date of procedure: 02/05/24   CHA2DS2-VASc Score = 4  This indicates a 4.8% annual risk of stroke. The patient's score is based upon: CHF History: 0 HTN History: 1 Diabetes History: 0 Stroke History: 0 Vascular Disease History: 1 Age Score: 1 Gender Score: 1     CrCl 83 ml/min using adj body weight Platelet count 192K  Patient has not had an Afib/aflutter ablation in the last 3 months, DCCV within the last 4 weeks or a watchman implanted in the last 45 days    Per office protocol, patient can hold Eliquis  for 2 days prior to procedure.     **This guidance is not considered finalized until pre-operative APP has relayed final recommendations.**

## 2024-01-07 NOTE — Telephone Encounter (Signed)
 Pt returning call to schedule. Please advise.

## 2024-01-07 NOTE — Telephone Encounter (Signed)
 S/w the pt and she has been scheduled tele preop appt 01/22/24. Med rec and consent are done.       Patient Consent for Virtual Visit        Catherine Callahan has provided verbal consent on 01/07/2024 for a virtual visit (video or telephone).   CONSENT FOR VIRTUAL VISIT FOR:  Catherine Callahan  By participating in this virtual visit I agree to the following:  I hereby voluntarily request, consent and authorize [facility] and its employed or contracted physicians, physician assistants, nurse practitioners or other licensed health care professionals (the Practitioner), to provide me with telemedicine health care services (the "Services) as deemed necessary by the treating Practitioner. I acknowledge and consent to receive the Services by the Practitioner via telemedicine. I understand that the telemedicine visit will involve communicating with the Practitioner through live audiovisual communication technology and the disclosure of certain medical information by electronic transmission. I acknowledge that I have been given the opportunity to request an in-person assessment or other available alternative prior to the telemedicine visit and am voluntarily participating in the telemedicine visit.  I understand that I have the right to withhold or withdraw my consent to the use of telemedicine in the course of my care at any time, without affecting my right to future care or treatment, and that the Practitioner or I may terminate the telemedicine visit at any time. I understand that I have the right to inspect all information obtained and/or recorded in the course of the telemedicine visit and may receive copies of available information for a reasonable fee.  I understand that some of the potential risks of receiving the Services via telemedicine include:  Delay or interruption in medical evaluation due to technological equipment failure or disruption; Information transmitted may not be  sufficient (e.g. poor resolution of images) to allow for appropriate medical decision making by the Practitioner; and/or  In rare instances, security protocols could fail, causing a breach of personal health information.  Furthermore, I acknowledge that it is my responsibility to provide information about my medical history, conditions and care that is complete and accurate to the best of my ability. I acknowledge that Practitioner's advice, recommendations, and/or decision may be based on factors not within their control, such as incomplete or inaccurate data provided by me or distortions of diagnostic images or specimens that may result from electronic transmissions. I understand that the practice of medicine is not an exact science and that Practitioner makes no warranties or guarantees regarding treatment outcomes. I acknowledge that a copy of this consent can be made available to me via my patient portal [facility] MyChart), or I can request a printed copy by calling the office of  [facility].    I understand that my insurance will be billed for this visit.   I have read or had this consent read to me. I understand the contents of this consent, which adequately explains the benefits and risks of the Services being provided via telemedicine.  I have been provided ample opportunity to ask questions regarding this consent and the Services and have had my questions answered to my satisfaction. I give my informed consent for the services to be provided through the use of telemedicine in my medical care

## 2024-01-07 NOTE — Telephone Encounter (Signed)
 Labs from [facility] 01/01/24  CBC 01/01/24 WBC 5.7 HGB 15.4 PLT 192   BMP BUN 11 CREATININE K+ 4.6

## 2024-01-07 NOTE — Telephone Encounter (Signed)
 Left message for the pt that we received the labs from Dr. Elicia office. We just need to schedule the tele preop appt.

## 2024-01-14 DIAGNOSIS — Z124 Encounter for screening for malignant neoplasm of cervix: Secondary | ICD-10-CM | POA: Diagnosis not present

## 2024-01-14 DIAGNOSIS — Z1331 Encounter for screening for depression: Secondary | ICD-10-CM | POA: Diagnosis not present

## 2024-01-14 DIAGNOSIS — I480 Paroxysmal atrial fibrillation: Secondary | ICD-10-CM | POA: Diagnosis not present

## 2024-01-14 DIAGNOSIS — E782 Mixed hyperlipidemia: Secondary | ICD-10-CM | POA: Diagnosis not present

## 2024-01-14 DIAGNOSIS — G4733 Obstructive sleep apnea (adult) (pediatric): Secondary | ICD-10-CM | POA: Diagnosis not present

## 2024-01-14 DIAGNOSIS — Z0000 Encounter for general adult medical examination without abnormal findings: Secondary | ICD-10-CM | POA: Diagnosis not present

## 2024-01-14 DIAGNOSIS — I10 Essential (primary) hypertension: Secondary | ICD-10-CM | POA: Diagnosis not present

## 2024-01-14 DIAGNOSIS — Z23 Encounter for immunization: Secondary | ICD-10-CM | POA: Diagnosis not present

## 2024-01-14 LAB — LAB REPORT - SCANNED
Albumin, Urine POC: <0.7
Creatinine, POC: 29 mg/dL
Microalb Creat Ratio: <24.3

## 2024-01-16 LAB — CBC
Hematocrit: 44.2 % (ref 34.0–46.6)
Hemoglobin: 15.1 g/dL (ref 11.1–15.9)
MCH: 35.8 pg — ABNORMAL HIGH (ref 26.6–33.0)
MCHC: 34.2 g/dL (ref 31.5–35.7)
MCV: 105 fL — ABNORMAL HIGH (ref 79–97)
Platelets: 176 x10E3/uL (ref 150–450)
RBC: 4.22 x10E6/uL (ref 3.77–5.28)
RDW: 12.8 % (ref 11.7–15.4)
WBC: 5.9 x10E3/uL (ref 3.4–10.8)

## 2024-01-21 ENCOUNTER — Other Ambulatory Visit (HOSPITAL_BASED_OUTPATIENT_CLINIC_OR_DEPARTMENT_OTHER): Payer: Self-pay | Admitting: Cardiovascular Disease

## 2024-01-21 DIAGNOSIS — I4891 Unspecified atrial fibrillation: Secondary | ICD-10-CM

## 2024-01-21 NOTE — Telephone Encounter (Signed)
 Prescription refill request for Eliquis  received. Indication:afib Last office visit:6/25 Scr:1.03  6/25 Age: 68 Weight:122.7  kg  Prescription refilled

## 2024-01-22 ENCOUNTER — Ambulatory Visit: Attending: Cardiovascular Disease | Admitting: Physician Assistant

## 2024-01-22 DIAGNOSIS — Z01810 Encounter for preprocedural cardiovascular examination: Secondary | ICD-10-CM

## 2024-01-22 NOTE — Progress Notes (Signed)
 Virtual Visit via Telephone Note   Because of Catherine Callahan co-morbid illnesses, she is at least at moderate risk for complications without adequate follow up.  This format is felt to be most appropriate for this patient at this time.  Due to technical limitations with video connection (technology), today's appointment will be conducted as an audio only telehealth visit, and Catherine Callahan verbally agreed to proceed in this manner.   All issues noted in this document were discussed and addressed.  No physical exam could be performed with this format.  Evaluation Performed:  Preoperative cardiovascular risk assessment _____________   Date:  01/22/2024   Patient ID:  Catherine Callahan, DOB 12-07-1955, MRN [id number] Patient Location:  Home Provider location:   Office  Primary Care Provider:  Claudene Pellet, MD Primary Cardiologist:  Victory LELON Claudene DOUGLAS, MD (Inactive)  Chief Complaint / Patient Profile   68 y.o. y/o female with a h/o atrial fibrillation, CAD, HTN, OSA on CPAP, obesity, lower extremity edema, and ulcerative colitis who is pending colonoscopy and presents today for telephonic preoperative cardiovascular risk assessment.  History of Present Illness    Catherine Callahan is a 68 y.o. female who presents via audio/video conferencing for a telehealth visit today.  Pt was last seen in cardiology clinic on 08/21/2023 by Daphne Barrack, NP.  At that time Catherine Callahan was doing well other than 2 episodes of AF, on antiarrhythmic therapy.  The patient is now pending procedure as outlined above. Since her last visit, she has gone out of rhythm at times. She does get a little short of breath. Five times at least out of rhythm. About once a month and she is out for a couple hours. She does have a little trouble climbing stairs and gets winded when she reaches the top.  Per office protocol, patient can hold Eliquis  for 2 days prior to procedure.  She can resume medically safe  to do so.   Past Medical History    Past Medical History:  Diagnosis Date   Arthritis    Atrial flutter (HCC) 01/05/2013   CAD (coronary artery disease) 07/01/2019   Encounter for monitoring flecainide  therapy 01/11/2011   Hypertension, essential, benign 01/11/2011   OSA on CPAP 01/05/2013   Overweight    Paroxysmal atrial fibrillation (HCC)    Ulcerative colitis (HCC)    Past Surgical History:  Procedure Laterality Date   ATRIAL FIBRILLATION ABLATION N/A 06/30/2019   Procedure: ATRIAL FIBRILLATION ABLATION;  Surgeon: Kelsie Agent, MD;  [facility]: [facility];  Service: Cardiovascular;  Laterality: N/A;   ATRIAL FIBRILLATION ABLATION N/A 01/17/2021   Procedure: ATRIAL FIBRILLATION ABLATION;  Surgeon: Kelsie Agent, MD;  Location: [facility] INVASIVE CV LAB;  Service: Cardiovascular;  Laterality: N/A;   COLON SURGERY  2004   colon resection    Allergies  Allergies  Allergen Reactions   Penicillin G Anaphylaxis   Penicillins Anaphylaxis   Latex Rash and Hives   Meloxicam Hives    Home Medications    Prior to Admission medications   Medication Sig Start Date End Date Taking? Authorizing Provider  acetaminophen  (TYLENOL ) 500 MG tablet Take 1,000 mg by mouth as needed for moderate pain (pain score 4-6).    [provider]  acyclovir (ZOVIRAX) 200 MG capsule Take 200 mg by mouth daily as needed (blisters). 06/19/21   [provider]  dofetilide  (TIKOSYN ) 125 MCG capsule TAKE 1 CAPSULE (125 MCG TOTAL) BY MOUTH 2 (TWO) TIMES DAILY.  08/23/23   Aniceto Daphne CROME, NP  ELIQUIS  5 MG TABS tablet TAKE 1 TABLET(5 MG) BY MOUTH TWICE DAILY 01/21/24   Mealor, Augustus E, MD  ezetimibe  (ZETIA ) 10 MG tablet TAKE 1 TABLET BY MOUTH EVERY DAY 01/24/23   Raford Riggs, MD  FIBER PO Take 2 capsules by mouth at bedtime.    [provider]  furosemide  (LASIX ) 20 MG tablet Take 1 tablet (20 mg total) by mouth daily as needed. Take if increased lower extremity swelling or  weight gain > 2.5lbs in 2-3 days or > 5lbs in one week 04/26/23 04/25/24  Aniceto Daphne CROME, NP  losartan  (COZAAR ) 100 MG tablet TAKE 1 TABLET BY MOUTH EVERY DAY 08/13/23   Raford Riggs, MD  magnesium  oxide (MAG-OX) 400 (240 Mg) MG tablet Take 1 tablet (400 mg total) by mouth 2 (two) times daily. 10/15/23   Aniceto Daphne CROME, NP  Mesalamine  800 MG TBEC Take 800 mg by mouth daily.     [provider]  metoprolol  succinate (TOPROL -XL) 50 MG 24 hr tablet TAKE 1 TABLET BY MOUTH EVERY DAY. TAKE IMMEDIATELY FOLLOWING A MEAL. 03/15/23   Shlomo Wilbert SAUNDERS, MD  metoprolol  tartrate (LOPRESSOR ) 25 MG tablet Take 1 tablet (25 mg total) by mouth as needed. 03/11/23   Lesia Ozell Barter, PA-C  Multiple Vitamin (MULTIVITAMIN WITH MINERALS) TABS tablet Take 1 tablet by mouth daily. Centrum Silver    [provider]  potassium chloride  (KLOR-CON ) 10 MEQ tablet Take 1 tablet (10 mEq total) by mouth daily. 10/15/23 01/13/24  Aniceto Daphne CROME, NP  rosuvastatin  (CRESTOR ) 40 MG tablet TAKE 1 TABLET BY MOUTH EVERY DAY 05/22/21   Kelsie Agent, MD    Physical Exam    Vital Signs:  Catherine Callahan does not have vital signs available for review today.  Given telephonic nature of communication, physical exam is limited. AAOx3. NAD. Normal affect.  Speech and respirations are unlabored.  Accessory Clinical Findings    None  Assessment & Plan    1.  Preoperative Cardiovascular Risk Assessment:  Catherine Callahan perioperative risk of a major cardiac event is 0.4% according to the Revised Cardiac Risk Index (RCRI).  Therefore, she is at low risk for perioperative complications.   Her functional capacity is good at 5.07 METs according to the Duke Activity Status Index (DASI). Recommendations: According to ACC/AHA guidelines, no further cardiovascular testing needed.  The patient may proceed to surgery at acceptable risk.   Antiplatelet and/or Anticoagulation Recommendations:  Eliquis  (Apixaban ) can be  held for 2 days prior to surgery.  Please resume post op when felt to be safe.     The patient was advised that if she develops new symptoms prior to surgery to contact our office to arrange for a follow-up visit, and she verbalized understanding.   A copy of this note will be routed to requesting surgeon.  Time:   Today, I have spent 6 minutes with the patient with telehealth technology discussing medical history, symptoms, and management plan.     Catherine LOISE Fabry, PA-C  01/22/2024, 9:56 AM

## 2024-01-23 ENCOUNTER — Other Ambulatory Visit: Payer: Self-pay | Admitting: Cardiovascular Disease

## 2024-01-24 ENCOUNTER — Encounter: Payer: Self-pay | Admitting: Cardiovascular Disease

## 2024-01-24 ENCOUNTER — Ambulatory Visit: Attending: Cardiovascular Disease | Admitting: Cardiovascular Disease

## 2024-01-24 VITALS — BP 118/74 | HR 72 | Ht 66.0 in | Wt 275.6 lb

## 2024-01-24 DIAGNOSIS — I4891 Unspecified atrial fibrillation: Secondary | ICD-10-CM

## 2024-01-24 MED ORDER — APIXABAN 5 MG PO TABS: 5.0000 mg | ORAL_TABLET | Freq: Two times a day (BID) | ORAL | 3 refills | Status: AC

## 2024-01-24 MED ORDER — DOFETILIDE 125 MCG PO CAPS
125.0000 ug | ORAL_CAPSULE | Freq: Two times a day (BID) | ORAL | 3 refills | Status: AC
Start: 2024-01-24 — End: ?

## 2024-01-24 MED ORDER — METOPROLOL TARTRATE 25 MG PO TABS: 25.0000 mg | ORAL_TABLET | ORAL | 3 refills | Status: AC | PRN

## 2024-01-24 MED ORDER — METOPROLOL SUCCINATE ER 50 MG PO TB24: 50.0000 mg | ORAL_TABLET | Freq: Every day | ORAL | 3 refills | Status: DC

## 2024-01-24 NOTE — Progress Notes (Signed)
 Cardiology Office Note:    Date:  01/24/2024   ID:  Catherine Callahan, DOB 26-Jun-1955, MRN [id number]  PCP:  Claudene Pellet, MD   [facility] Providers Cardiologist:  Victory LELON Claudene DOUGLAS, MD (Inactive) Electrophysiologist:  Eulas FORBES Furbish, MD  Sleep Medicine:  Wilbert Bihari, MD     Referring MD: Claudene Pellet, MD   Chief complaint: routine follow-up -no acute complaints  History of Present Illness:    Catherine Callahan is a 68 y.o. 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  She was diagnosed with atrial fibrillation in about 2014. She failed flecainide . She is very symptomatic with AF -- fatigue, shortness of breath, and palpitations.  She underwent atrial fibrillation ablation in April 2021 and underwent a repeat ablation in November 2022 after recurrence.  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  She was last seen in clinic by Daphne Barrack in June 2025.  At that time, she noted that she had a few episodes of of atrial fibrillation.    EKGs/Labs/Other Studies Reviewed:     EKG:       Recent Labs: 08/21/2023: BUN 10; Creatinine, Ser 1.03; Magnesium  1.9; Potassium 4.8; Sodium 140 01/15/2024: Hemoglobin 15.1; Platelets 176    Risk Assessment/Calculations:    CHA2DS2-VASc Score = 4   This indicates a 4.8% annual risk of stroke. The patient's score is based upon: CHF History: 0 HTN History: 1 Diabetes History: 0 Stroke History: 0 Vascular Disease History: 1 Age Score: 1 Gender Score: 1          Physical Exam:    VS:  BP 118/74   Pulse 72   Ht 5' 6 (1.676 m)   Wt 275 lb 9.6 oz (125 kg)   SpO2 94%   BMI 44.48 kg/m     Wt Readings from Last 3 Encounters:  01/24/24 275 lb 9.6 oz (125 kg)  08/21/23 270 lb 8 oz  (122.7 kg)  05/24/23 270 lb (122.5 kg)     GEN:  Well nourished, well developed in no acute distress CARDIAC: RRR, no murmurs, rubs, gallops RESPIRATORY:  Normal work of breathing MUSCULOSKELETAL: no edema    ASSESSMENT & PLAN:    Paroxysmal atrial fibrillation and atrial flutter:  Stable, having an episode lasting a few hours maybe once a month As long as her burden remains low and stable, will continue Tikosyn  in order to avoid amiodarone. CHADS2Vasc score is 3 continue eliquis . She will have renal function tested today. Her BMI is 44, at her current weight, the risk of ablation would outweigh the benefit.  Hypertension:  Continue metoprolol  50 mg in the morning, 25 mg in the evening. Take HCTZ a little later in the day  Obstructive sleep apnea: continue CPAP  Obesity We discussed the importance of weight loss for maintenance of sinus rhythm  First-degree AV block           Medication Adjustments/Labs and Tests Ordered: Current medicines are reviewed at length with the patient today.  Concerns regarding medicines are outlined above.  Orders Placed This Encounter  Procedures   EKG 12-Lead   No orders of the defined types were placed in this encounter.    Signed, Eulas FORBES Furbish, MD  01/24/2024 10:31 AM  [facility]

## 2024-01-24 NOTE — Patient Instructions (Signed)
 Medication Instructions:  Your physician recommends that you continue on your current medications as directed. Please refer to the Current Medication list given to you today.  *If you need a refill on your cardiac medications before your next appointment, please call your pharmacy*  Lab Work: None ordered.  If you have labs (blood work) drawn today and your tests are completely normal, you will receive your results only by: MyChart Message (if you have MyChart) OR A paper copy in the mail If you have any lab test that is abnormal or we need to change your treatment, we will call you to review the results.  Testing/Procedures: None ordered.   Follow-Up: At [facility], you and your health needs are our priority.  As part of our continuing mission to provide you with exceptional heart care, our providers are all part of one team.  This team includes your primary Cardiologist (physician) and Advanced Practice Providers or APPs (Physician Assistants and Nurse Practitioners) who all work together to provide you with the care you need, when you need it.  Your next appointment:   6 months with Dr Mealor

## 2024-01-27 ENCOUNTER — Other Ambulatory Visit: Payer: Self-pay | Admitting: Cardiovascular Disease

## 2024-01-27 DIAGNOSIS — K080 Exfoliation of teeth due to systemic causes: Secondary | ICD-10-CM | POA: Diagnosis not present

## 2024-01-27 MED ORDER — EZETIMIBE 10 MG PO TABS: 10.0000 mg | ORAL_TABLET | Freq: Every day | ORAL | 0 refills | Status: DC

## 2024-02-03 ENCOUNTER — Other Ambulatory Visit (HOSPITAL_BASED_OUTPATIENT_CLINIC_OR_DEPARTMENT_OTHER): Payer: Self-pay

## 2024-02-05 DIAGNOSIS — K6389 Other specified diseases of intestine: Secondary | ICD-10-CM | POA: Diagnosis not present

## 2024-02-05 DIAGNOSIS — Z09 Encounter for follow-up examination after completed treatment for conditions other than malignant neoplasm: Secondary | ICD-10-CM | POA: Diagnosis not present

## 2024-02-05 DIAGNOSIS — K6289 Other specified diseases of anus and rectum: Secondary | ICD-10-CM | POA: Diagnosis not present

## 2024-02-05 DIAGNOSIS — K644 Residual hemorrhoidal skin tags: Secondary | ICD-10-CM | POA: Diagnosis not present

## 2024-02-05 DIAGNOSIS — Z980 Intestinal bypass and anastomosis status: Secondary | ICD-10-CM | POA: Diagnosis not present

## 2024-02-05 DIAGNOSIS — K5730 Diverticulosis of large intestine without perforation or abscess without bleeding: Secondary | ICD-10-CM | POA: Diagnosis not present

## 2024-02-05 DIAGNOSIS — K5190 Ulcerative colitis, unspecified, without complications: Secondary | ICD-10-CM | POA: Diagnosis not present

## 2024-02-05 DIAGNOSIS — Z860101 Personal history of adenomatous and serrated colon polyps: Secondary | ICD-10-CM | POA: Diagnosis not present

## 2024-02-05 DIAGNOSIS — K648 Other hemorrhoids: Secondary | ICD-10-CM | POA: Diagnosis not present

## 2024-02-07 NOTE — Telephone Encounter (Signed)
 Overdue for gen cards follow up. Looks like pt sent to [facility] but triage sent to Dr. Raford.   Recommend update FLP/ALT prior to new Rx. Please inquire when she last took Rosuvastatin . Recommend schedule overdue gen cards follow up with Dr. Raford or APP.   Catherine Kafer S Lamarius Dirr, NP

## 2024-02-12 ENCOUNTER — Encounter: Payer: Self-pay | Admitting: Cardiology

## 2024-02-23 ENCOUNTER — Other Ambulatory Visit: Payer: Self-pay | Admitting: Cardiovascular Disease

## 2024-02-24 ENCOUNTER — Encounter (HOSPITAL_BASED_OUTPATIENT_CLINIC_OR_DEPARTMENT_OTHER): Payer: Self-pay

## 2024-03-17 ENCOUNTER — Other Ambulatory Visit: Payer: Self-pay | Admitting: Cardiovascular Disease

## 2024-03-18 MED ORDER — METOPROLOL SUCCINATE ER 50 MG PO TB24: 50.0000 mg | ORAL_TABLET | Freq: Every day | ORAL | 3 refills | Status: AC

## 2024-03-24 ENCOUNTER — Telehealth: Payer: Self-pay

## 2024-03-24 NOTE — Telephone Encounter (Signed)
 Catherine Callahan

## 2024-03-26 ENCOUNTER — Ambulatory Visit: Attending: Cardiology | Admitting: Cardiology

## 2024-03-26 ENCOUNTER — Telehealth: Payer: Self-pay | Admitting: *Deleted

## 2024-03-26 VITALS — BP 112/78 | HR 69 | Ht 66.0 in | Wt 273.0 lb

## 2024-03-26 DIAGNOSIS — I4891 Unspecified atrial fibrillation: Secondary | ICD-10-CM

## 2024-03-26 DIAGNOSIS — G4733 Obstructive sleep apnea (adult) (pediatric): Secondary | ICD-10-CM

## 2024-03-26 DIAGNOSIS — I10 Essential (primary) hypertension: Secondary | ICD-10-CM

## 2024-03-26 DIAGNOSIS — I480 Paroxysmal atrial fibrillation: Secondary | ICD-10-CM

## 2024-03-26 NOTE — Telephone Encounter (Signed)
 Cpap and supplies ordered through [facility]  Upon patient request DME selection is [facility] Patient understands he will be contacted by [facility] to set up his cpap. Patient understands to call if [facility] does not contact him with new setup in a timely manner. Patient understands they will be called once confirmation has been received from [facility] that they have received their new machine to schedule 10 week follow up appointment.  Patient notified via her mychart and phone call.   [facility] notified of new cpap order  Please add to airview Patient was grateful for the call and thanked me.

## 2024-03-26 NOTE — Patient Instructions (Addendum)
.  Medication Instructions:   No changes *If you need a refill on your cardiac medications before your next appointment, please call your pharmacy*   Lab Work: Not needed    Testing/Procedures:  Not needed  Follow-Up: At [facility], you and your health needs are our priority.  As part of our continuing mission to provide you with exceptional heart care, we have created designated Provider Care Teams.  These Care Teams include your primary Cardiologist (physician) and Advanced Practice Providers (APPs -  Physician Assistants and Nurse Practitioners) who all work together to provide you with the care you need, when you need it.     Your next appointment:    Sleep coordinator will contact you with next appt  The format for your next appointment:   In Person  Provider:   Dr Wilbert Bihari

## 2024-03-26 NOTE — Telephone Encounter (Signed)
-----   Message from Wilbert Bihari, MD sent at 03/26/2024  9:01 AM EST ----- Order new CPAP supplies and mask of choice.  Order a new ResMed Airsense 11 CPAP at 14cm H2O with heated humidity

## 2024-03-26 NOTE — Progress Notes (Signed)
 "    Sleep Medicine  Note    Date:  03/26/2024   ID:  Catherine Callahan, DOB May 05, 1955, MRN [id number]  PCP:  Claudene Pellet, MD  Cardiologist: Crosby Scarce, MD  Chief Complaint  Patient presents with   Sleep Apnea   Hypertension    History of Present Illness:    Catherine Callahan is a 69 y.o. female with a history of OSA, HTN and obesity. She also has a hx of CAD, PAD and HLD.  She is followed in afib clinic. She was lost to followup for Sleep Medicine and is now referred back for Sleep Medicine consult to reestablish care.  She is doing well with her PAP device.  She tolerates the mask and feels the pressure is adequate.  She feels rested in the am and has no significant daytime sleepiness.  She denies any significant mouth or nasal dryness or nasal congestion.  She does not think that she snores. An Epworth Sleepiness Scale score was calculated the office today and this endorsed at 0 arguing against residual daytime sleepiness. Patient denies any episodes of  restless legs, No hypnogognic hallucinations or cataplectic events.  She does have bruxism and uses a mouth guard.   Prior CV studies:   The following studies were reviewed today:  PAP compliance download  Past Medical History:  Diagnosis Date   Arthritis    Atrial flutter (HCC) 01/05/2013   CAD (coronary artery disease) 07/01/2019   Encounter for monitoring flecainide  therapy 01/11/2011   Hypertension, essential, benign 01/11/2011   OSA on CPAP 01/05/2013   Overweight    Paroxysmal atrial fibrillation (HCC)    Ulcerative colitis (HCC)    Past Surgical History:  Procedure Laterality Date   ATRIAL FIBRILLATION ABLATION N/A 06/30/2019   Procedure: ATRIAL FIBRILLATION ABLATION;  Surgeon: Kelsie Agent, MD;  Location: [facility] INVASIVE CV LAB;  Service: Cardiovascular;  Laterality: N/A;   ATRIAL FIBRILLATION ABLATION N/A 01/17/2021   Procedure: ATRIAL FIBRILLATION ABLATION;  Surgeon: Kelsie Agent, MD;  Location: [facility]  INVASIVE CV LAB;  Service: Cardiovascular;  Laterality: N/A;   COLON SURGERY  2004   colon resection     Current Meds  Medication Sig   acetaminophen  (TYLENOL ) 500 MG tablet Take 1,000 mg by mouth as needed for moderate pain (pain score 4-6).   acyclovir (ZOVIRAX) 200 MG capsule Take 200 mg by mouth daily as needed (blisters).   apixaban  (ELIQUIS ) 5 MG TABS tablet Take 1 tablet (5 mg total) by mouth 2 (two) times daily.   dofetilide  (TIKOSYN ) 125 MCG capsule Take 1 capsule (125 mcg total) by mouth 2 (two) times daily.   ezetimibe  (ZETIA ) 10 MG tablet TAKE 1 TABLET(10 MG) BY MOUTH DAILY   FIBER PO Take 2 capsules by mouth at bedtime.   furosemide  (LASIX ) 20 MG tablet Take 1 tablet (20 mg total) by mouth daily as needed. Take if increased lower extremity swelling or weight gain > 2.5lbs in 2-3 days or > 5lbs in one week   losartan  (COZAAR ) 100 MG tablet TAKE 1 TABLET BY MOUTH EVERY DAY   magnesium  oxide (MAG-OX) 400 (240 Mg) MG tablet Take 1 tablet (400 mg total) by mouth 2 (two) times daily.   Mesalamine  800 MG TBEC Take 800 mg by mouth daily.    metoprolol  succinate (TOPROL -XL) 50 MG 24 hr tablet Take 1 tablet (50 mg total) by mouth daily. Take with or immediately following a meal.   metoprolol  tartrate (LOPRESSOR ) 25 MG tablet Take 1  tablet (25 mg total) by mouth as needed.   Multiple Vitamin (MULTIVITAMIN WITH MINERALS) TABS tablet Take 1 tablet by mouth daily. Centrum Silver   potassium chloride  (KLOR-CON ) 10 MEQ tablet Take 1 tablet (10 mEq total) by mouth daily.   rosuvastatin  (CRESTOR ) 40 MG tablet TAKE 1 TABLET BY MOUTH EVERY DAY     Allergies:   Penicillin g, Penicillins, Latex, and Meloxicam   Social History   Tobacco Use   Smoking status: Former    Current packs/day: 0.00    Average packs/day: 0.8 packs/day for 38.0 years (28.5 ttl pk-yrs)    Types: Cigarettes    Start date: 23    Quit date: 2012    Years since quitting: 14.0    Passive exposure: Never   Smokeless  tobacco: Never   Tobacco comments:    Former smoker 04/08/23  Vaping Use   Vaping status: Never Used  Substance Use Topics   Alcohol use: Yes    Alcohol/week: 4.0 - 6.0 standard drinks of alcohol    Types: 4 - 6 Cans of beer per week    Comment: 2 beers 3-4 times a week 02/14/2021   Drug use: No     Family Hx: The patient's family history includes Alzheimer's disease in her mother; Atrial fibrillation in her father; CAD (age of onset: 25) in her brother; Healthy in her brother; Heart disease in her brother and mother; Other in her brother; Stroke in her father.  ROS:   Please see the history of present illness.     All other systems reviewed and are negative.   Labs/Other Tests and Data Reviewed:    Recent Labs: 08/21/2023: BUN 10; Creatinine, Ser 1.03; Magnesium  1.9; Potassium 4.8; Sodium 140 01/15/2024: Hemoglobin 15.1; Platelets 176   Recent Lipid Panel Lab Results  Component Value Date/Time   CHOL 142 11/02/2022 08:45 AM   TRIG 78 11/02/2022 08:45 AM   HDL 71 11/02/2022 08:45 AM   CHOLHDL 2.0 11/02/2022 08:45 AM   CHOLHDL 3.3 04/10/2010 03:40 AM   LDLCALC 56 11/02/2022 08:45 AM    Wt Readings from Last 3 Encounters:  03/26/24 273 lb (123.8 kg)  01/24/24 275 lb 9.6 oz (125 kg)  08/21/23 270 lb 8 oz (122.7 kg)     Objective:    Vital Signs:  BP 112/78 (BP Location: Right Arm, Patient Position: Sitting, Cuff Size: Large)   Pulse 69   Ht 5' 6 (1.676 m)   Wt 273 lb (123.8 kg)   SpO2 93%   BMI 44.06 kg/m   GEN: Well nourished, well developed in no acute distress HEENT: Normal NECK: No JVD; No carotid bruits LYMPHATICS: No lymphadenopathy CARDIAC:RRR, no murmurs, rubs, gallops RESPIRATORY:  Clear to auscultation without rales, wheezing or rhonchi  ABDOMEN: Soft, non-tender, non-distended MUSCULOSKELETAL:  No edema; No deformity  SKIN: Warm and dry NEUROLOGIC:  Alert and oriented x 3 PSYCHIATRIC:  Normal affect  ASSESSMENT & PLAN:    OSA - The patient is  tolerating PAP therapy well without any problems. The PAP download performed by his DME was personally reviewed and interpreted by me today and showed an AHI of 1.6 /hr on 14 cm H2O with 100% compliance in using more than 4 hours nightly.  The patient has been using and benefiting from PAP use and will continue to benefit from therapy.   HTN -BP controlled on exam today at 112/78 mmHg -Continue losartan  100 mg daily, Toprol -XL 50 mg daily with as needed refills -I  have personally reviewed and interpreted outside labs performed by patient's PCP which showed serum creatinine 1 and potassium 4.8 on 08/21/2023    Medication Adjustments/Labs and Tests Ordered: Current medicines are reviewed at length with the patient today.  Concerns regarding medicines are outlined above.  Tests Ordered: No orders of the defined types were placed in this encounter.  Medication Changes: No orders of the defined types were placed in this encounter.   Disposition:  Follow up with me 6 weeks after getting new device  Signed, Wilbert Bihari, MD  03/26/2024 8:58 AM    [facility] "

## 2024-04-29 ENCOUNTER — Ambulatory Visit (HOSPITAL_BASED_OUTPATIENT_CLINIC_OR_DEPARTMENT_OTHER)

## 2024-05-08 ENCOUNTER — Ambulatory Visit (HOSPITAL_BASED_OUTPATIENT_CLINIC_OR_DEPARTMENT_OTHER): Admitting: Family
